# Patient Record
Sex: Male | Born: 1950 | Race: White | Hispanic: No | Smoking: Never smoker
Health system: Southern US, Community
[De-identification: ages and names within clinical notes are randomized; demographics above are authoritative.]

## PROBLEM LIST (undated history)

## (undated) DIAGNOSIS — M4810 Ankylosing hyperostosis [Forestier], site unspecified: Secondary | ICD-10-CM

## (undated) DIAGNOSIS — G8929 Other chronic pain: Secondary | ICD-10-CM

## (undated) DIAGNOSIS — I639 Cerebral infarction, unspecified: Secondary | ICD-10-CM

## (undated) DIAGNOSIS — IMO0001 Reserved for inherently not codable concepts without codable children: Secondary | ICD-10-CM

## (undated) DIAGNOSIS — I10 Essential (primary) hypertension: Secondary | ICD-10-CM

## (undated) DIAGNOSIS — H9190 Unspecified hearing loss, unspecified ear: Secondary | ICD-10-CM

## (undated) DIAGNOSIS — M1990 Unspecified osteoarthritis, unspecified site: Secondary | ICD-10-CM

## (undated) DIAGNOSIS — K219 Gastro-esophageal reflux disease without esophagitis: Secondary | ICD-10-CM

## (undated) DIAGNOSIS — R269 Unspecified abnormalities of gait and mobility: Secondary | ICD-10-CM

## (undated) HISTORY — PX: BACK SURGERY: SHX140

## (undated) HISTORY — PX: LAMINECTOMY: SHX219

## (undated) HISTORY — PX: TUMOR EXCISION: SHX421

---
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
## 2000-04-03 ENCOUNTER — Encounter: Admission: RE | Admit: 2000-04-03 | Discharge: 2000-04-03 | Payer: Self-pay | Admitting: Orthopedic Surgery

## 2000-04-03 ENCOUNTER — Encounter: Payer: Self-pay | Admitting: Orthopedic Surgery

## 2000-05-07 HISTORY — PX: KNEE ARTHROSCOPY W/ ACL RECONSTRUCTION: SHX1858

## 2000-10-07 HISTORY — PX: KNEE SURGERY: SHX244

## 2001-01-21 ENCOUNTER — Encounter: Payer: Self-pay | Admitting: Neurosurgery

## 2001-01-21 ENCOUNTER — Ambulatory Visit (HOSPITAL_COMMUNITY): Admission: RE | Admit: 2001-01-21 | Discharge: 2001-01-21 | Payer: Self-pay | Admitting: Neurosurgery

## 2001-02-08 ENCOUNTER — Encounter: Payer: Self-pay | Admitting: Neurosurgery

## 2001-02-10 ENCOUNTER — Inpatient Hospital Stay (HOSPITAL_COMMUNITY): Admission: RE | Admit: 2001-02-10 | Discharge: 2001-02-11 | Payer: Self-pay | Admitting: Neurosurgery

## 2001-02-10 ENCOUNTER — Encounter: Payer: Self-pay | Admitting: Neurosurgery

## 2001-03-28 ENCOUNTER — Encounter: Admission: RE | Admit: 2001-03-28 | Discharge: 2001-03-28 | Payer: Self-pay | Admitting: Neurosurgery

## 2001-03-28 ENCOUNTER — Encounter: Payer: Self-pay | Admitting: Neurosurgery

## 2001-12-20 ENCOUNTER — Encounter: Admission: RE | Admit: 2001-12-20 | Discharge: 2002-03-20 | Payer: Self-pay

## 2002-03-28 ENCOUNTER — Encounter: Admission: RE | Admit: 2002-03-28 | Discharge: 2002-06-26 | Payer: Self-pay

## 2002-06-27 ENCOUNTER — Encounter: Admission: RE | Admit: 2002-06-27 | Discharge: 2002-09-05 | Payer: Self-pay

## 2002-09-12 ENCOUNTER — Ambulatory Visit (HOSPITAL_COMMUNITY): Admission: RE | Admit: 2002-09-12 | Discharge: 2002-09-12 | Payer: Self-pay | Admitting: Family Medicine

## 2002-09-12 ENCOUNTER — Encounter: Payer: Self-pay | Admitting: Family Medicine

## 2002-09-22 ENCOUNTER — Encounter: Admission: RE | Admit: 2002-09-22 | Discharge: 2002-12-21 | Payer: Self-pay

## 2002-12-22 ENCOUNTER — Encounter: Admission: RE | Admit: 2002-12-22 | Discharge: 2003-03-22 | Payer: Self-pay

## 2003-09-23 ENCOUNTER — Encounter: Payer: Self-pay | Admitting: Emergency Medicine

## 2003-09-23 ENCOUNTER — Emergency Department (HOSPITAL_COMMUNITY): Admission: EM | Admit: 2003-09-23 | Discharge: 2003-09-23 | Payer: Self-pay | Admitting: Emergency Medicine

## 2003-10-09 ENCOUNTER — Ambulatory Visit (HOSPITAL_COMMUNITY): Admission: RE | Admit: 2003-10-09 | Discharge: 2003-10-09 | Payer: Self-pay | Admitting: Internal Medicine

## 2004-10-13 ENCOUNTER — Ambulatory Visit (HOSPITAL_COMMUNITY): Admission: RE | Admit: 2004-10-13 | Discharge: 2004-10-13 | Payer: Self-pay | Admitting: Family Medicine

## 2006-03-16 ENCOUNTER — Emergency Department (HOSPITAL_COMMUNITY): Admission: EM | Admit: 2006-03-16 | Discharge: 2006-03-16 | Payer: Self-pay | Admitting: Emergency Medicine

## 2007-07-11 ENCOUNTER — Inpatient Hospital Stay (HOSPITAL_COMMUNITY): Admission: EM | Admit: 2007-07-11 | Discharge: 2007-07-12 | Payer: Self-pay | Admitting: Emergency Medicine

## 2007-07-22 ENCOUNTER — Ambulatory Visit (HOSPITAL_COMMUNITY): Admission: RE | Admit: 2007-07-22 | Discharge: 2007-07-22 | Payer: Self-pay | Admitting: Family Medicine

## 2008-03-23 ENCOUNTER — Ambulatory Visit (HOSPITAL_COMMUNITY): Admission: RE | Admit: 2008-03-23 | Discharge: 2008-03-23 | Payer: Self-pay | Admitting: Family Medicine

## 2011-04-21 NOTE — Discharge Summary (Signed)
NAMERORAN, WEGNER                 ACCOUNT NO.:  [id number]   MEDICAL RECORD NO.:  [id number]          PATIENT TYPE:  INP   LOCATION:  A310                          FACILITY:  [facility]   PHYSICIAN:  Mila Homer. Sudie Bailey, M.D.DATE OF BIRTH:  Feb 10, 1951   DATE OF ADMISSION:  07/10/2007  DATE OF DISCHARGE:  08/05/2008LH                               DISCHARGE SUMMARY   This 60 year old male is admitted to the hospital with pneumonia.  He  had a benign two day hospitalization extending from July 11, 2007, to  July 12, 2007.  Vital signs remained stable.   His admission white cell count was 16,000 with 90% neutrophils, 6 lymphs  and his BMET showed a glucose of 144, BUN 5, creatinine 0.73.  ABGs on  room air showed pH 7.4, pCO2 45, pO2 67, bicarbonate 0.7.  His  urinalysis essentially negative except for 0-2 RBCs per HPF and culture  at one day was negative.  Blood cultures x2 one day were negative.  Recheck white cell count his second day was 8000, with 49% neutrophils,  39 lymphs, 8 monos and his BMET showed a sodium 146, bicarbonate 35 with  a glucose down to 91.   His chest x-ray showed low volume film with basilar atelectasis.   He was admitted to the hospital.  He was put on IV normal saline KVO  with standing orders, sputum culture and Neurontin 600 mg q.h.s.,  oxycodone/acetaminophen 5 mg q.4h. p.r.n., Protonix 40 mg daily, Avelox  500 mg IV daily.   He was put on Lovenox prophylaxis while in the hospital.  By August the  4th, this was really the first day in the hospital but second after  being started on IV antibiotics, he felt a good deal improved and by the  following day he is almost back to normal though still a little bit  weak.  It is felt that he had reached maximum hospital benefit and was  ready for discharge home.  At that time his IV was stopped and he was  discharged.  Before I left I discussed his pneumonia and his DISH at  length with the patient and wife.  His  rheumatologist, Dr. Corliss Skains, in  [place], had offered to have him go to [facility] for further review  and workup of his DISH, the patient had initially refused and he was  encouraged at the time of discharge to do this, both for his own benefit  and possibly for benefit of rheumatologist at [facility].   FINAL DISCHARGE DIAGNOSES:  1. Pneumonia.  2. Diffuse idiopathic skeletal hyperkeratosis.  3. Chronic pain.  4. Depression.   He is discharged home on:  1. Levaquin 500 mg daily for 15 days.  2. Prevacid 30 mg daily.  3. Neurontin 300 mg 2 q.h.s.  4. Cymbalta 60 mg q.a.m.  5. OxyContin 60 mg b.i.d. with OxyContin 30 mg b.i.d.  6. Oxycodone/acetaminophen 5/325, two tablets q.i.d. for breakthrough      pain.  7. Temazepam 30 mg two q.h.s.  8. Alprazolam one and one-half tablet q.h.s.  9.  Claritin OTC 10 mg p.r.n. for allergy.   Followup in the office in a week.  Again, discussed possibility of  further review of his DISH at a tertiary care center.      Mila Homer. Sudie Bailey, M.D.  Electronically Signed     SDK/MEDQ  D:  07/12/2007  T:  07/12/2007  Job:  [id number]

## 2011-04-21 NOTE — H&P (Signed)
[name], [name]                 ACCOUNT NO.:  [id number]   MEDICAL RECORD NO.:  [id number]          PATIENT TYPE:  INP   LOCATION:  A310                          FACILITY:  [facility]   PHYSICIAN:  Mila Homer. Sudie Bailey, M.D.DATE OF BIRTH:  [birth year]   DATE OF ADMISSION:  07/10/2007  DATE OF DISCHARGE:  LH                              HISTORY & PHYSICAL   This 60 year old presented to the hospital emergency room last night.  About 10:30, had shaking chill and felt horrible all over.   He said he really had not been feeling well over the last couple days  and actually had been coughing up some thick, greenish sputum at a  regular basis.  He felt so bad, he is actually taking the Levaquin 500  mg, last night before coming to the emergency room.   He has had some fever and chills with this.  He said he really did not  have any chest pain, no abdominal problems.  His chronic medical  problems include DISH, depression, obesity, insomnia.  His arthritic  problems are so severe, he is bedridden most of the time.  He is on  chronic narcotics, which was reduced last year, but had to be increased  again this year due to chronic pain.   CURRENT MEDICATIONS:  1. Neurontin 300 mg, 2 q.h.s.  2. Prevacid 30 mg daily.  3. MS Contin 60 mg b.i.d. and MS Contin 30 mg b.i.d.  4. Oxycodone 5 mg two q.i.d. for breakthrough pain.  5. Alprazolam 1 mg, 1-1/2 half tablets q.h.s.  6. Cymbalta 60 mg daily.  7. Temazepam 30 mg q.h.s.   ADMISSION EXAM:  GENERAL:  Showed a pleasant, obese middle-aged man.  VITAL SIGNS:  At time of my exam, his temperature was 97.3, pulse 83,  respiratory rate 22, blood pressure 98/61.  He was an excellent  historian.  He is no acute distress at the time I examined him, some  hours after he came into the emergency room.  He gave a good history.  Sentence structure was intact.  Sensorium appeared to be normal.  Color  was good.  HEENT:  Mucous membranes are moist.  There was no  axillary or  supraclavicular adenopathy.  LUNGS:  Clear throughout.  He is moving air well with no intercostal  retraction or use of accessory muscles of respiration.  Morbid obesity  was noted.  HEART:  Regular rhythm without murmur, rate of about 80.  ABDOMEN:  Soft, a little bit distended without hepatosplenomegaly or  mass or tenderness.  There is no edema of the ankles.   His admission white cell count was 16,000 which 90% neutrophils, 6  lymphs.  __________  showed a glucose of 144, BUN 5, creatinine 0.73.  Urine showed an SG 1015, pH 8.5, and 0 to 2 RBCs per HPF.  A Urine C&S  was pending, and a blood C&S was negative at less than 24 hours.  His  admission blood gas showed a pH 7.4, pCO2 of 45, pO2 of 67, bicarb of 27  on room air.  On  2 liters, his O2 sats 97%.   CHEST X-RAY:  Showed bibasilar atelectasis.   ADMISSION DIAGNOSIS:  1. Pneumonia.  2. DISH.  3. Depression.  4. Obesity.  5. Insomnia.   The patient has already been started on Avelox 400 mg IV daily, and he  actually feels a good deal better this morning, so I assume that either  this or the Levaquin he took p.o. last night before coming to the  emergency room kicked in and is helping him.  Cultures again are  pending.  A repeat the white cell count is due in the morning, and that  dropped down to normal, and he is afebrile and otherwise feeling better.  Will probably discharge him at that time.      Mila Homer. Sudie Bailey, M.D.  Electronically Signed     SDK/MEDQ  D:  07/11/2007  T:  07/11/2007  Job:  [id number]

## 2011-04-24 NOTE — H&P (Signed)
[name], [name]                 ACCOUNT NO.:  [id number]   MEDICAL RECORD NO.:  [id number]          PATIENT TYPE:  EMS   LOCATION:  ED                            FACILITY:  [facility]   PHYSICIAN:  Kingsley Callander. Ouida Sills, MD       DATE OF BIRTH:  03/26/1951   DATE OF ADMISSION:  03/16/2006  DATE OF DISCHARGE:  LH                                HISTORY & PHYSICAL   CHIEF COMPLAINT:  Fever.   HISTORY OF PRESENT ILLNESS:  This patient is a 60 year old white male who  presented to the emergency room after developing fevers, chills, and rigors  at home this morning.  He had been in his usual state of health yesterday.  He developed a shaking chill at home.  He presented to the emergency room  and was found to have a temperature of 102.3.  He has a chronic cough  productive of brown sputum but was found to have no infiltrate on chest x-  ray.  He denied headache, chest pain, abdominal pain, difficulty voiding,  dysuria, vomiting, or diarrhea.  He denied tick bite or rash.  He had  recently experienced a bout of shingles involving his left forehead.   PAST MEDICAL HISTORY:  1.  DISH.  2.  Depression.  3.  GERD.  4.  Allergies.  5.  Low back surgery in 2001.  6.  Two right knee surgeries.  7.  Hypertension.   MEDICATIONS:  1.  MS Contin 60 mg b.i.d.  2.  Oxycodone 5 mg 2 b.i.d.  3.  Neurontin 300 mg 2 q.h.s.  4.  Zyrtec 10 mg daily.  5.  Prilosec daily.  6.  Lexapro 10 mg daily.  7.  Alprazolam 1 mg q.h.s.  8.  Temazepam 30 mg q.h.s.   ALLERGIES:  CECLOR, AUGMENTIN.   SOCIAL HISTORY:  He denies alcohol or tobacco use.  He is disabled.  He is a  retired [employer] account executive.   REVIEW OF SYSTEMS:  As above.   PHYSICAL EXAMINATION:  VITAL SIGNS:  Temperature 102.3 initially, repeat  100.8, blood pressure 112/60, pulse initially 118, repeat 92, respirations  20, oxygen saturation 99%.  GENERAL:  Alert and oriented.  No acute distress.  HEENT:  No scleral icterus.  Pupils are equal,  round and reactive to light.  Pharynx moist, unremarkable.  NECK:  No lymphadenopathy.  LUNGS:  Clear.  HEART:  Regular with no murmurs.  ABDOMEN:  Nontender.  No hepatosplenomegaly.  GU:  No CVA tenderness.  EXTREMITIES:  No clubbing, cyanosis or edema.  He is generally somewhat  stiff.  NEUROLOGIC:  Grossly intact.   LABORATORY DATA:  White count 16,000, hemoglobin 15.1, platelets 206,000, 94  segs, 4 lymphs.  Sodium 135, potassium 3.6, bicarb 28, glucose 124, BUN 7,  creatinine 0.8, calcium 9.2.  Urinalysis reveals 3-6 white cells and 7-10  red cells.   Chest x-ray reveals no acute infiltrate.   IMPRESSION:  Probable urinary tract infection:  He has white cells and red  cells on his urinalysis but is asymptomatic.  There is no evidence of  another source of his fever at this point.  Hospitalization and treatment  with IV antibiotics was advised.  The patient declines and wants to be  treated as an outpatient.  He was made aware of his possible risk of  developing septic shock leading to death.  He and his wife were counseled in  this regard, and it is his decision to be discharged from the emergency  room.  He will therefore have two sets of blood cultures and a urine  culture.  Levaquin 500 mg IV will be administered now, and he will be  started on Levaquin 500 mg orally for 10 days.  He was advised to follow up  with Dr. Sudie Bailey tomorrow.  He was encouraged to use two Tylenol every 4  hours as needed for fever and to supplement with two Advil in between his  Tylenol doses if needed.      Kingsley Callander. Ouida Sills, MD  Electronically Signed     ROF/MEDQ  D:  03/16/2006  T:  03/16/2006  Job:  [id number]

## 2011-04-24 NOTE — Consult Note (Signed)
[facility]  Patient:    Cameron, [name] Visit Number: [id number] MRN: [id number]          Service Type: PMG Location: TPC Attending Physician:  Sondra Come Dictated by:   Sondra Come, D.O. Proc. Date: 01/02/02 Admit Date:  12/20/2001   CC:         [name], M.D.   Consultation Report  HISTORY OF PRESENT ILLNESS:  [name] returns to the clinic today as scheduled for re-evaluation.  He is status post left lumbar facet blocks on December 23, 2001, which he states gave him greater than 50% relief for a few hours after the injection.  He notes that later on that evening his pain started to gradually worsen, although when he woke up the following morning he felt significant relief of his pain until early that evening.  The patient noted that since he had significant relief of his left lower back pain, he believes there is a masked pain on his right side.  Currently his pain level is a 4/10 on a subjective scale.  His function and quality of life indices remain about the same, but improved during the anesthetic phase of the facet blocks.  His sleep is fair.  He continues to take Percocet as needed.  I reviewed the health and history form and 14-point review of systems.  PHYSICAL EXAMINATION:  A healthy male in no acute distress.  VITAL SIGNS:  The blood pressure is 140/78, pulse 78, respirations 20, O2 and saturation 97% on room air.  NEUROLOGIC:  No neurologic findings in the lower extremities, including motor sensory and reflexes.  IMPRESSION:  Chronic low back pain with considerable component from facet arthropathy given positive response to facet blocks.  The patient is status post lumbar laminectomy.  PLAN: 1. I had a thorough discussion with [name] regarding treatment options at    this point.  I think it is reasonable to proceed with radiofrequency    neurotomy of the left medial branches and L5 dorsal ramus.  I discussed  the    procedure with [name] in detail to include risks, benefits, limitations,    and alternatives.  The patient wishes to proceed.  We will schedule him for    later this week or next week for radiofrequency procedure. 2. Will consider right lumbar facet blocks at a later time as predicated upon    the patients response to medial branch neurotomy. 3. Continue current medications. 4. The patient is to return to clinic for procedure.  The patient was educated on the above findings and recommendations and understands.  There were no barriers to communication. Dictated by:   Sondra Come, D.O. Attending Physician:  Sondra Come DD:  01/02/02 TD:  01/03/02 Job: [id number]

## 2011-04-24 NOTE — Consult Note (Signed)
[facility]  Patient:    [name], RECORD Visit Number: [id number] MRN: [id number]          Service Type: PMG Location: TPC Attending Physician:  Sondra Come Dictated by:   Sondra Come, D.O. Proc. Date: 12/23/01 Admit Date:  12/20/2001   CC:         Cristi Loron, M.D.   Consultation Report  HISTORY OF PRESENT ILLNESS:  Mr. Standre returns to the clinic today as scheduled for a trial of left lumbar facet blocks for low back pain diagnostically and therapeutically.  Mr. Bless was initially seen on December 21, 2001.  He denies any significant changes in his low back pain, although he states that his pain is a little bit increased today secondary to not taking his Percocet.  I reviewed the health and history form and 14-point review of systems.  No new neurologic complaints.  PHYSICAL EXAMINATION:  A healthy male in no acute distress.  VITAL SIGNS:  The blood pressure is 134/86, pulse 73, respirations 18, and O2 saturation 98% on room air.  NEUROLOGIC:  No new neurologic findings in the lower extremities, including motor, sensory, and reflexes.  There is tenderness to palpation in bilateral lumbar paraspinals, greater on the left.  IMPRESSION:  Chronic low back pain with radiation of pain into the left hip, status post lumbar laminectomy.  Probable component of facet arthropathy contributing to the patients current symptoms.  PLAN: 1. Left lumbar facet block/medial branch blocks.  The procedure was described    to the patient in detail, including the risks, benefits, limitations, and    alternatives.  The patient wished to proceed.  We will forego using    steroids for this procedure secondary to the patients previous adverse    reaction to steroids.  Instead, I will use 0.5% bupivacaine only.  The    patient was brought back to the fluoroscopy suite and placed on the table    in the prone position.  The skin was prepped and draped in usual  sterile    fashion.  The skin and subcutaneous tissues were anesthetized with 2 cc of    1% lidocaine at four independent needle access points.  Under direct    fluoroscopic guidance in an oblique view, a 25 gauge 3-1/2 inch needle was    advanced to the junction of the left sacral ala and S1 superior articular    process.  Once bone was contacted, negative aspiration was performed.  This    was then injected with 0.75 cc of 0.5% bupivacaine.  Using independent    needle access points, the 25 gauge 3-1/2 inch spinal needle was advanced to    the junctions of the L3, L4, and L5 superior articular processes with their    corresponding transverse processes.  Once bone was contacted and negative    aspiration performed, each site was injected with 0.75 cc of    0.5% bupivacaine.  The patient tolerated the procedure well.  No    complications.  Discharge instructions given.  The patients preinjection    pain level was 8/10 and post injection 0/10. 2. Continue Percocet for now. 3. The patient is to return to the clinic in one week for re-evaluation.  The    patient was instructed to keep a log of his pain score over the next    several hours and days and report this at next visit.  A positive response  to anesthetic for these blocks may be followed by radiofrequency ablation    of medial branches.  We discussed this.  The patient was educated on the above findings and recommendations and understands.  There were no barriers to communication. Dictated by:   Sondra Come, D.O. Attending Physician:  Sondra Come DD:  12/24/01 TD:  12/26/01 Job: [id number]

## 2011-04-24 NOTE — Consult Note (Signed)
[facility]  Patient:    [name], [name] Visit Number: [id number] MRN: [id number]          Service Type: Attending:  Sondra Come, D.O. Dictated by:   Sondra Come, D.O. Proc. Date: 03/08/02   CC:         Cristi Loron, M.D.   Consultation Report  Mr. Render returns to clinic today as scheduled for reevaluation.  He was last seen on February 15, 2002.  In the interim Mr. Squier was started on Duragesic 25 mcg patches as we had discussed at last visit.  He states that the patches seem to be helping but he still continues to take Percocet q.i.d. for breakthrough pain.  His function and quality of life indexes have improved. His sleep is fair to good.  He continues walking four miles per day which he states does not aggravate his symptoms.  He also goes to the gym every other day for light upper body strengthening exercises.  He states that he is not overdoing it.  He continues to take Neurontin 300 mg q.i.d. which he states helps his radicular pain.  He continues to have pain in his low back, mainly. He recently got disability from his work and states that he is officially retired.  I review health and history form and 14 point review of systems.  We discussed medications at length.  PHYSICAL EXAMINATION  GENERAL:  Healthy male in no acute distress.  VITAL SIGNS:  Blood pressure 106/53, pulse 90, respirations 16, O2 saturation 99% on room air.  NEUROLOGIC:  Manual muscle testing is 5/5 bilateral lower extremities. Resisted left hip flexion does cause some increased left low back pain. Sensory examination is intact to light touch bilateral lower extremities. Muscle stretch reflexes are 2+/4 bilateral patellar, medial hamstrings, and Achilles.  IMPRESSION: 1. Chronic low back pain, multifactorial, improved with medications. 2. Lumbar facet syndrome nine weeks status post radiofrequency neural ablation    of the left medial branches without  considerable relief. 3. Degenerative disk disease of the lumbar spine status post multilevel lumbar    laminectomy with possible postlaminectomy syndrome contributing to his    current problems.  PLAN: 1. Mr. Vancuren and I had a long discussion regarding medications.  I would have    hoped that his Percocet use would have decreased with the initiation of    Duragesic, however, to the contrary.  He continues on the same dose of    Percocet.  We discussed this at length.  I think it is reasonable to    increase his Duragesic to 50 mcg q.72h. with the goal of decreasing his    breakthrough pain medication requirement.  New prescription for Duragesic    50 mcg one q.72h. #10 without refills.  I will also renew Percocet 10 mg    one p.o. t.i.d. p.r.n. for breakthrough pain #90 with no refills. 2. Continue Neurontin 300 mg q.i.d. 3. Continue alprazolam which has been written by Dr. Sudie Bailey, his primary    care Icelyn Navarrete for now.  Consider switching to Klonopin at a later time. 4. Patient to continue with home exercise program. 5. Patient to return to clinic in one month for reevaluation.  Patient was educated on the above findings and recommendations and understands.  There were no barriers to communication. Dictated by:   Sondra Come, D.O. Attending:  Sondra Come, D.O. DD:  03/08/02 TD:  03/09/02 Job: [id number]

## 2011-04-24 NOTE — Consult Note (Signed)
NAMEROBSON, [name]                           ACCOUNT NO.:  [id number]   MEDICAL RECORD NO.:  [id number]                   PATIENT TYPE:  REC   LOCATION:  TPC                                  FACILITY:  [facility]   PHYSICIAN:  Cameron George, DO                      DATE OF BIRTH:  05/14/1951   DATE OF CONSULTATION:  03/08/2003  DATE OF DISCHARGE:                                   CONSULTATION   HISTORY OF PRESENT ILLNESS:  The patient returns to clinic today for  reevaluation.  He was last seen on 02/05/2003.  He has been doing well in  terms of his pain management with medications including MS Contin 100 mg  q.12h. and MS Contin 15 to 30 mg q.12h. with oxycodone for breakthrough  pain.  The patient states that he does not notice any difference between  taking 130 mg of OxyContin b.i.d. and 115 mg of OxyContin b.i.d.  He would  like to go back to the 115 mg dose and also he would like to try to decrease  it just down to 100 mg b.i.d. at some point. I discussed this with him at  length and told him to use pain as his guide, but he has been very compliant  with his medications, and we discussed decreasing the medications slowly,  and he understands.  He did run out of oxycodone for breakthrough pain a few  days ago and has persistent diffuse pain/polyarthralgia's involving his  lower back, bilateral knees, bilateral shoulders, elbows, and hands.  His  function and quality of life indices remain somewhat declined but are  improved overall since I initially started seeing him.  In addition to the  opiate-based pain medication, he has continued to take Neurontin 300 mg  b.i.d.  He has discontinued Lexapro because he states that his depression  has resolved.  I reviewed the health and history form and 14-point Review of  Systems.   We had discussed referring him to Dr. Thyra [name] for further pain  management; however, for some reason he never got an appointment with Dr.  Vear [name], and he has  met with his primary care [name] in the interim who  will take over writing for his pain medications.  If symptoms seem to change  and medications need to be modified, I would recommend referral to Dr. Thyra [name].  He is also waiting for an appointment with Dr. Corliss [name],  rheumatologist, for evaluation.  I instructed him to call her office to find  out when his appointment is.   The patient's pain today scored at 5/10.   PHYSICAL EXAMINATION:  GENERAL:  Healthy male in no acute distress.  VITAL SIGNS:  Blood pressure 144/75, pulse 97, respirations 22, O2  saturation 99% on room air.  NEUROLOGIC:  The patient has slight difficulty getting up from a seated  position secondary to discomfort, and gait is slightly antalgic with single-  point cane.  No new neurologic findings in the upper and lower extremities  including motor, sensory, and reflexes today.  MUSCULOSKELETAL:  There is no synovitis noted in the hands; however, there  is some tenderness to palpation over the second metacarpophalangeal joint on  the left.  There is also discomfort with range of motion of the shoulders,  elbows, and hands.   IMPRESSION:  1. Post laminectomy syndrome.  2. Chronic low back pain.  3. Degenerative disk disease of lumbar spine.  4. Degenerative disk disease of thoracic spine.  5. Degenerative disk disease of the cervical spine.  6. Cervical spinal stenosis.  7. Polyarthralgia.    PLAN:  1. Continue MS Contin.  Gave prescription for MS Contin 100 mg 1 p.o. q.12h.     as well as MS Contin 15 mg 1 p.o. q.12h., #60 without refills each.     Again, the patient is interested in decreasing the MS Contin over time,     and I instructed him to decrease by 15 mg weekly until he is at 100 mg     b.i.d.  2. Continue oxycodone 5 mg 1 to 2 p.o. b.i.d. as needed for breakthrough     pain, #120 without refills.  3. Neurontin 300 mg 1 p.o. b.i.d., #60 with 2 refills.  4. Await rheumatology  appointment.  5. The patient is to follow up with his primary care [name].  He may     return to clinic on an as-needed basis.   The patient was educated about findings and recommendations and understands.  No barriers to communication.                                               Cameron George, DO    JW/MEDQ  D:  03/08/2003  T:  03/09/2003  Job:  [phone]

## 2011-04-24 NOTE — Consult Note (Signed)
[facility]  Patient:    [name], [name] Visit Number: [id number] MRN: [id number]          Service Type: PMG Location: [facility] Attending Physician:  Sondra Come Dictated by:   Sondra Come, D.O. Proc. Date: 12/21/01 Admit Date:  12/20/2001   CC:         Cristi [name], M.D.   Consultation Report  NEW PATIENT CONSULTATION  REFERRING Tayana Shankle:  Cristi [name], M.D.  Thank you very much for kindly referring Mr. Mickell [name] to [facility] for evaluation of low back pain.  The patient was seen in our clinic today.  Please refer to the following for details regarding the history and physical examination and treatment plan.  Once again, thank you for allowing [place] to participate in the care of [name].  CHIEF COMPLAINT:  Low back pain.  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  He was found to have lumbar spinal stenosis with lateral recess stenosis from L3-S1 with degenerative disk disease, spondylosis, and radiculopathy.  He underwent decompressive lumbar laminectomy at the L3-S1 levels by Danae Orleans. Venetia Maxon, M.D., on February 10, 2001. Postoperatively, he continued to have pain in his low back radiating into his left buttock and groin.  He complains of significant morning stiffness in his low back.  He underwent L1, L2, and L3 selective nerve root blocks on the left by Sheran Luz, M.D., in June or July of 2002.  He states that the nerve root blocks helped modestly for a very short period of time, although he had significant reaction to the steroids, including anxiousness and nervousness, as well as insomnia.  He has been treated with multiple anti-inflammatories, including Mobic, Lodine, Celebrex, Bextra, Voltaren, and Lortab without significant relief.   He feels that the Vioxx caused him to have rapid heart rate.  Currently he is taking Percocet 5 mg two to four per day as prescribed by Cristi [name], M.D.  His pain level is 7/10 on a subjective scale today and described as constant, sharp, and stabbing, involving his low back, radiating into his left hip.  His function and quality of life indices have declined overall, but remained stable.  His sleep is fair.  His symptoms are worse with bending and sitting and improved with rest and medications.  He has an exercise program at this point, which includes walking four miles every day to every other day and upper body weight exercises.  He has not had physical therapy.  He continues to work for the [employer] and we discussed the ramifications of being on narcotic medications in his current job position.  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  He denies any bowel and bladder dysfunction or lower extremity numbness or paresthesias.  I review health and history form and 14-point review of systems.  PAST MEDICAL HISTORY:  Hypertension.  PAST SURGICAL HISTORY: 1. Lumbar laminectomy. 2. Anterior cruciate ligament reconstruction of right knee. 3. Some type of benign mass removed from under his right shoulder blade.  FAMILY HISTORY:  Noncontributory.  SOCIAL HISTORY:  Denies smoking or alcohol use.  He is married and continues to work.  ALLERGIES:  STEROIDS.  VIOXX caused rapid heart rate.  MEDICATIONS: 1.  Prinivil. 2. Allegra. 3. Prilosec 4. Alprazolam 1 mg at bedtime as prescribed by his primary care Amberle Lyter. 5. Percocet 5 mg two to four per day.  PHYSICAL EXAMINATION:  A healthy male in no acute distress.  VITAL SIGNS:  The blood pressure is 154/90, pulse 80, respirations 18, and O2 saturation 96% on room air.  NEUROLOGIC:  Gait is normal.  Examination of the spine reveals a level  pelvic with decreased lumbar lordosis.  There is a large midline incisional scar.  No scoliosis noted.  Range of motion is some decreased in all planes with pain mainly on extension compared to flexion.  Flexion does, however, cause a pulling sensation in his low back.  Manual muscle testing is 5/5 in bilateral lower extremities.  The sensory examination is intact to light touch in bilateral lower extremities.  Muscle stretch reflexes are 2+/4 in bilateral patellar, medial hamstrings, and Achilles.  Palpatory examination reveals tenderness to palpation in bilateral lumbar paraspinal muscles.  Pulses are present bilaterally in the lower extremities distally.  Straight leg raise and Fabers tests are negative bilaterally.  LABORATORY DATA:  MRI of the lumbar spine dated November 17, 2001, reveals: At L5-S1 there is asymmetric facet arthropathy, left greater than right, with mild left S1 nerve root encroachment in the lateral recess with no definite L5 nerve root encroachment in the foramen.  At L4-5, there is marked posterior element hypertrophy with small central disk protrusion with no root cut off or spinal stenosis.  At L3-4, there is marked posterior element hypertrophy, broad-based disk central protrusion central and to the left, and no definite L4 nerve encroachment of the canal.  There may be mild left L3 nerve root encroachment due to disk material in the foramen.  At L2-3, there is mild facet hypertrophy and no stenosis or disk protrusion.  At L1-2, there is mild annular bulging, but no stenosis or disk protrusion.  IMPRESSION: 1. Chronic low back pain with left lower extremity radicular symptoms into the    left buttock, possibly multifactorial. 2. Degenerative disk disease of the lumbar spine without myelopathy. 3. Facet arthropathy of the lumbar spine.  PLAN: 1. I had a long and thorough discussion with [name] regarding his low back    pain and treatment options at this  point.  The patient is not a surgical     candidate at this time per Cristi [name], M.D.  Given the patients    symptoms and his MRI findings, it is conceivable that his pain may be    stemming from his facet arthropathy or least there is a contributing factor    from the facet arthropathy and I cannot rule this out based on physical    exam.  Therefore, it is reasonable to proceed with diagnostic/therapeutic    facet blocks on the left.  A positive response to facet blocks may lead    towards radiofrequency ablation of the lumbar medial branches on the left.    A negative response or no response to the injection would lead me to    believe that the patients symptoms are main secondary to discogenic    etiology and possibly secondary to the epidural fibrosis.  At that point, I    would consider epidural lysis of adhesions/Racz, which was discussed in the    past by Sheran Luz, M.D. 2. Continue Percocet for now. 3. Consider weaning from alprazolam and substituting a low-dose tricyclic    antidepressant for sleep. 4. The  patient is to return to clinic for left lumbar facet blocks.  The patient was educated on the above findings and recommendations and understands.  There were no barriers to communication. Dictated by:   Sondra Come, D.O. Attending Physician:  Sondra Come DD:  12/22/01 TD:  12/24/01 Job: [id number]

## 2011-04-24 NOTE — Consult Note (Signed)
[facility]  Patient:    [name], [name] Visit Number: [id number] MRN: [id number]          Service Type: PMG Location: [facility] Attending Physician:  Sondra Come Dictated by:   Sondra Come, D.O. Proc. Date: 05/19/02 Admit Date:  03/28/2002                            Consultation Report  HISTORY OF PRESENT ILLNESS:  The patient returns to clinic today sooner than scheduled for reevaluation.  He continues to have significant and worsening low-back pain over the last two months.  He continues on Duragesic 50 mcg q.48h. and Percocet 10 mg/650 mg t.i.d..  He has requested to increase his Percocet dose to q.i.d. because of the increase in his pain.  The patient is very concerned about his escalating requirement of narcotic pain medication, as he is somewhat reluctant to proceed in this fashion secondary to fear of becoming addicted to the medicine.  We discussed this at length.  We discussed the difference between addiction, physical dependence, and pseudoaddiction.  I do not believe the patient displays any drug-seeking behavior.  In fact, on the contrary he, if anything, would not like to take this medicine if possible.  He also states that he is starting to have some pain radiating down into his left lower extremity again.  He believes that the rainy weather may be somewhat responsible for his increase in pain.  He brings his medications with him and has 26 Percocet pills remaining and six Duragesic patches remaining as well.  He continues on ibuprofen 800 mg as needed.  I review the health and history form and 14-point review of systems.  Pain is a 7/10 on a subjective scale.  Function and quality of life indices remain declined today. Sleep is poor.  Other pain complaints include bilateral shoulder and knee pain, which he attributes to arthritis.  PHYSICAL EXAMINATION:  GENERAL:  A healthy male in no acute distress.  VITAL SIGNS:  Blood pressure  118/57, pulse 61, respirations 18.  O2 saturation is 94% on room air.  NEUROLOGIC:  The patient has full range of motion of the shoulders and knees but with discomfort.  The patient has some discomfort upon standing from a sitting position.  Gait is antalgic.  Palpatory examination of the lumbar spine reveals modest tenderness to palpation bilaterally.  Manual muscle testing is 5/5 bilateral lower extremities with some subtle giveaway weakness on the right.  Sensory examination is intact to light touch bilateral lower extremities.  Muscle stretch reflexes are symmetric bilateral lower extremities.  IMPRESSION: 1. Postlaminectomy syndrome. 2. Chronic low-back pain. 3. Degenerative disk disease of the lumbar spine, status post multilevel    lumbar laminectomy. 4. Lumbar facet syndrome.  PLAN: 1. I had a thorough discussion with the patient regarding treatment options.    This was an extensive consultation of 25 minutes duration.  I am sensitive    to the patients concern regarding narcotic escalation and we discussed at    length myths surrounding narcotic addiction.  I do not feel that escalating    his Duragesic dose to 75 mcg will cause him to be addicted to the    medication and we discussed this at length; however, we will hold off on    doing so at this time per patient request.  We will first start by    increasing his Neurontin dose  up to 1800 mg a day by adding one 300-mg pill    every two days until he is at 1800 mg.  This will take four days. 2. Continue oxycodone for breakthrough pain.  We will write him a prescription    for oxycodone 5 mg two p.o. q.i.d. as needed for breakthrough pain #50    without refills.  He may continue taking the Percocet in substitution for    the oxycodone but I am somewhat concerned about the Tylenol dosing with the    Percocet four times a day.  We discussed proper usage of these medications    and the patient understands. 3. Continue to use  the transcutaneous electrical nerve stimulation unit. 4. We discussed alternative therapies to include acupuncture.  Also,    discussed possible trial of spinal cord stimulator, which he is somewhat    reluctant to pursue at this point. 5. Discussed further injection therapies, although the patient has very    significant response to any type of steroid injections.  Therefore, I do    not feel that he is a candidate for further epidural injections. 6. Recommend aquatic therapy. 7. The patient to return to clinic in 10 days for reevaluation.  The patient was educated on the above findings and recommendations and understands.  There were no barriers to communication. Dictated by:   Sondra Come, D.O. Attending Physician:  Sondra Come DD:  05/19/02 TD:  05/22/02 Job: [id number]

## 2011-04-24 NOTE — Op Note (Signed)
NAME:  [name], CAPOZZOLI A                           ACCOUNT NO.:  [id number]   MEDICAL RECORD NO.:  [id number]                   PATIENT TYPE:  AMB   LOCATION:  DAY                                  FACILITY:  [facility]   PHYSICIAN:  Lionel December, M.D.                 DATE OF BIRTH:  October 14, 1951   DATE OF PROCEDURE:  DATE OF DISCHARGE:                                 OPERATIVE REPORT   PROCEDURE:  Total colonoscopy followed by esophagogastroduodenoscopy.   ENDOSCOPIST:  Lionel December, M.D.   INDICATIONS:  Markez is a 60 year old Caucasian male with severe back  problems who is on multiple meds.  He was recently noted to have heme  positive stools. He also has mild anemia.  He has chronic GERD.  He is on  PPI and symptom control was felt to be satisfactory.  He is undergoing  diagnostic evaluation.  If his colonoscopy is normal, he will undergo  esophagogastroduodenoscopy as well.  The procedures and risks were reviewed  with the patient and informed consent was obtained.   PREOPERATIVE MEDICATIONS:  Cetacaine spray for topical oropharyngeal  anesthesia.  Demerol 50 mg IV and Versed 12 mg IV.   FINDINGS:  Procedure performed in endoscopy suite.  The patient's vital  signs and O2 saturation were monitored during the procedure and remained  stable.   PROCEDURE #1: TOTAL COLONOSCOPY:  A rectal examination was performed.  No  abnormality noted on external or digital exam.   Olympus videoscope was placed in the rectum and advanced under vision into  the sigmoid colon and beyond.  Preparation was satisfactory.  There was  diffuse pigmentation of mucosa from the rectum all the way to the cecum.  It  was quite prominent in his ascending colon and cecum.  The cecal landmarks  were very easily identified. Pictures were taken of the appendiceal orifice  and ileocecal valve.  As the scope was withdrawn the colonic mucosa was  carefully examined.  There was a focal area of either mucosal edema or  small  polyp in the transverse colon.  This was ablated by a cold biopsy.  The  mucosa of the rest of the colon was normal.  The rectal mucosa similarly was  normal.   The scope was retroflexed to examine the anorectal junction and small  hemorrhoids were noted below the dentate line.  The endoscope was  straightened and withdrawn and the patient prepared for procedure #2.   PROCEDURE #2: ESOPHAGOGASTRODUODENOSCOPY:  The Olympus videoscope was passed  via the oropharynx without any difficulty into the esophagus.   ESOPHAGUS:  Mucosa of the esophagus was normal throughout.  Squamocolumnar  junction was unremarkable and no hernia was noted.   STOMACH:  It was empty and distended very well with insufflation.  The folds  of the proximal stomach were normal.  Examination of the mucosa revealed  swollen prepyloric/pyloric folds  with erosion on it.  The pyloric channel  was patent though.  The angularis, fundus, and cardia were examined by  retroflexing the scope and were normal.   DUODENUM:  Examination of the bulb and postbulbar duodenum was normal.   The endoscope was withdrawn.   The patient tolerated the procedures well.   FINAL DIAGNOSES:  1. Melanosis coli and small external hemorrhoids.  2. Small polyp ablated by a cold biopsy from the transverse colon.  3. Swollen and prepyloric/pyloric fold with single erosion, otherwise normal     esophagogastroduodenoscopy.   RECOMMENDATIONS:  He will resume his usual meds as well as Prilosec.  We  will check his H&H and H. pylori serology today.  I will be contacting the  patient with results of the blood test and biopsy over the next couple of  days.      ___________________________________________                                            Lionel December, M.D.   NR/MEDQ  D:  10/09/2003  T:  10/09/2003  Job:  [id number]   cc:   Mila Homer. Sudie Bailey, M.D.  [address] [place], [place] [address]  Fax: [phone]

## 2011-04-24 NOTE — Consult Note (Signed)
[name], [name]                           ACCOUNT NO.:  [id number]   MEDICAL RECORD NO.:  [id number]                   PATIENT TYPE:  REC   LOCATION:  [facility]                                  FACILITY:  [facility]   PHYSICIAN:  Sondra Come, D.O.                 DATE OF BIRTH:  08-Sep-1951   DATE OF CONSULTATION:  07/26/2002  DATE OF DISCHARGE:                  PHYSICAL MEDICINE & REHABILITATION CONSULTATION   HISTORY OF PRESENT ILLNESS:  The patient returns to clinic today as  scheduled for reevaluation.  He was last seen June 28, 2002.  Overall, he  seems to be very stable in terms of his low back pain on the Duragesic 50  mcg q.48 h. and Roxicodone 15 mg b.i.d. as needed for breakthrough pain.  Typically, this results in b.i.d. dosing consistently.  His pain today is a  4/10 on a subjective scale.  He has seen Dr. Venetia Maxon in the interim who has  ordered MRIs of his cervical, thoracic, and lumbar spine as the patient has  reported that he is now having pain radiating up into his thoracic and  cervical regions as well.  He has also seen Dr. Lequita Halt for his left  shoulder and elbow pain.  He states he has such severe arthritis in his left  shoulder that he may be a candidate for elbow replacement surgery although  that would limit him to five pounds of lifting for the rest of his life and  he is not interested in this.  Overall, his function and quality of life  indices have improved to some degree.  He continues a home exercise program  which includes walking and light weightlifting.  His sleep is good.  He has  had one additional episode of urinary incontinence at night and I am  uncertain of the etiology in this regard.   REVIEW OF SYSTEMS:  I reviewed the health and history form and 14-point  review of systems.   PHYSICAL EXAMINATION:  GENERAL:  A healthy-appearing male in no acute  distress.  VITAL SIGNS:  Blood pressure 124/59, pulse 75, respirations 18, O2  saturation is 97% on  room air.  EXTREMITIES:  Manual muscle testing is 5/5 bilateral lower extremities.  Sensory examination is intact to light touch bilateral lower extremities at  this time.  Muscle stretch reflexes are symmetric bilateral lower  extremities. Gait is slightly antalgic.  The patient does have some  difficulty going from sitting to standing secondary to low back pain.   IMPRESSION:  1. Post laminectomy syndrome.  2. Chronic low back pain.  3. Degenerative disk disease of the lumbar spine status post multilevel     lumbar laminectomy.  4. Lumbar facet arthropathy.  5. Urinary incontinence of uncertain etiology.   PLAN:  1. Continue Duragesic 50 mcg one q.48 h. dispense #15 without refills.  2. Continue Roxicodone 15 mg 1/2 to 1 p.o. b.i.d. as needed for breakthrough  pain dispense #60 without refills.  3. Continue Neurontin 600 mg one p.o. t.i.d. dispense #270 without refills.     This is a three-month supply.  4. Continue with his home exercise program.  5. Will try to obtain copies of notes from Dr. Venetia Maxon and Dr. Lequita Halt.  6. Continue glucosamine chondroitin.  7. Consider urology consultation for possible urodynamic studies.  8. The patient to return to clinic in one month for reevaluation.   The patient was educated on above findings and recommendations and  understands.  There were no barriers to communication.                                               Sondra Come, D.O.    JJW/MEDQ  D:  07/26/2002  T:  07/26/2002  Job:  [id number]   cc:   Danae Orleans. Venetia Maxon, M.D.  [address]  [place]  [place] [address]  Fax: [phone]

## 2011-04-24 NOTE — Consult Note (Signed)
NAMEJAYEN, [name]                           ACCOUNT NO.:  [id number]   MEDICAL RECORD NO.:  [id number]                   PATIENT TYPE:  REC   LOCATION:  [facility]                                  FACILITY:  [facility]   PHYSICIAN:  Zachary George, DO                      DATE OF BIRTH:  [birth year]   DATE OF CONSULTATION:  12/05/2002  DATE OF DISCHARGE:                                   CONSULTATION   REASON FOR CONSULTATION:  The patient returns to clinic today for  reevaluation.  He was last seen on November 06, 2002.  He has been doing very  well in terms of his pain on the Avinza 120 mg two p.o. every day.  He has  been breaking through intermittently and has been using oxycodone 5 mg up to  six times per day, depending on the severity of his pain.  He states that he  does have days where he does not need any breakthrough pain medication.  Currently, his pain is a 2/10 on a subjective scale.  He continues going to  gym on a daily basis but has not been walking as much primarily because of  the weather.  He is waiting for his rheumatology appointment.  His function  and quality-of-life indices have improved.  His sleep is better, although he  does wake up occasionally with pain in his shoulders or low back or lower  extremities.  In addition, the patient states that he restarted Neurontin  300 mg b.i.d. secondary to some jitteriness that he was experiencing after  he discontinued the medication and he states that it is helping his  radicular component as well as helping him with his jittery feeling.  He  also continues on mineral oil and stool softeners and is having regular  bowel movements.  I reviewed health and history form and 14-point review of  systems.   PHYSICAL EXAMINATION:  GENERAL:  Physical examination reveals a healthy male  in no acute distress.  VITAL SIGNS:  Blood pressure 132/62, pulse 86, respirations 18, O2  saturation 97% on room air.  NEUROMUSCULAR:  The patient still  has some slight difficulty in getting up  from sitting position to standing position secondary to pain, but this is  significantly less than previous.  He does have tightness in his thoracic  paraspinous muscles bilaterally.  No new neurologic findings on physical  exam including motor, sensory and reflexes in the upper and lower  extremities.   IMPRESSION:  1. Post-laminectomy syndrome.  2. Chronic low back pain.  3. Degenerative disk disease of the spine including lumbar, cervical and     thoracic.  4. Lumbar facet arthropathy.  5. Cervical spinal stenosis.  6. Severe osteoarthritis, multiple joints.   PLAN:  1. Continue Avinza 120 mg two p.o. every day, #60 without refills.  The  patient brought his pills for a pill count and it is appropriate.  2. Continue oxycodone 5 mg one to two p.o. b.i.d. as needed for breakthrough     pain, #100 without refills.  The patient brings his oxycodone for a pill     count and is appropriate.  3. Continue a home exercise program.  4. Continue mineral oil and stool softener.  5. Patient to return to clinic in one month for reevaluation.   Patient was educated in the above findings and recommendations and  understands.  There were no barriers to communication.                                               Zachary George, DO    JW/MEDQ  D:  12/05/2002  T:  12/06/2002  Job:  [id number]

## 2011-04-24 NOTE — Consult Note (Signed)
[facility]  Patient:    [name], [name] Visit Number: [id number] MRN: [id number]          Service Type: PMG Location: [facility] Attending Physician:  Sondra Come Dictated by:   Sondra Come, D.O. Proc. Date: 04/28/02 Admit Date:  03/28/2002                            Consultation Report  HISTORY OF PRESENT ILLNESS:  Mr. Hlavaty returns to clinic today as scheduled for reevaluation.  He was last seen on March 30, 2002.  He continues to have good and bad days.  Today seems to be a slightly worse day than he has been having recently.  His pain today is a 6/10 on a subjective scale.  Overall, he continues to see improvement in his symptoms.  He continues to have low back pain radiating into his groin.  He also has arthritic pain in his knees and elbows.  His function and quality of life indices are improved. He continues walking in a home exercise program.  He does relate a history of six episodes of urinary incontinence over the past month.  His last MRI was in December 2002.  I reviewed the health and history form and 14 point review of systems.  The patients pain is constant, sharp, stabbing, with associated weakness in the lower extremities on occasion.  He continues on Duragesic 50 mcg q.48h. which he stated is helping significantly.  He also has been taking Percocet 10 mg 2-3 q.d. for breakthrough pain.  Mr. Cocuzza and I discussed his medications at length.  PHYSICAL EXAMINATION:  GENERAL:  A healthy male in no acute distress.  VITAL SIGNS:  Blood pressure 127/63, pulse 98, respirations 12, O2 saturation 98% on room air.  MANUAL MUSCLE TESTING:  5/5 bilateral lower extremities.  SENSORY:  Intact to light touch bilateral lower extremities.  MUSCLE STRETCH REFLEXES:  Symmetric bilateral lower extremities.  ELBOWS:  No heat, erythema, or edema.  There is no tenderness to palpation over the lateral or medial epicondyles.  The patient does not have  full extension bilaterally.  IMPRESSION: 1. Postlaminectomy syndrome. 2. Chronic low back pain, multifactorial, overall improved with occasional    exacerbation. 3. Lumbar facet syndrome. 4. Degenerative disk disease of the lumbar spine, status post multilevel    lumbar laminectomy.  PLAN: 1. Continue Duragesic 50 mcg patch q.48h.  Mr. Eyerman and I discussed possibly    increasing this to 37, but he wishes to keep it at 50 mg, #15 without    refills. 2. Continue Percocet 10 mg 1 p.o. b.i.d. to t.i.d. p.r.n. breakthrough pain,    #90 without refills. 3. Continue Neurontin. 4. Continue home exercise program. 5. Will consider repeat MRI and possible urologic referral if the patients    urinary incontinence worsens or persists.   The patient and I discussed    this at length.  We will continue to monitor this. 6. The patient to return to clinic in one month for reevaluation.  The patient was educated on the above-findings and recommendations and understands.  No barriers to communication. Dictated by:   Sondra Come, D.O. Attending Physician:  Sondra Come DD:  04/28/02 TD:  05/02/02 Job: [id number]

## 2011-04-24 NOTE — Consult Note (Signed)
[name], [name]                           ACCOUNT NO.:  [id number]   MEDICAL RECORD NO.:  [id number]                   PATIENT TYPE:  REC   LOCATION:  [facility]                                  FACILITY:  [facility]   PHYSICIAN:  Zachary George, DO                      DATE OF BIRTH:  06-08-1951   DATE OF CONSULTATION:  DATE OF DISCHARGE:                                   CONSULTATION   [name] returns to clinic today sooner than scheduled for re-evaluation.  [name] states that over the past three weeks, he has had a significant  amount of breakthrough pain involving his low back, which is now radiating  to his posterior thighs bilaterally on occasion.  He also complains of  severe left shoulder pain and left hand pain.  He has diffuse osteoarthritis  and post laminectomy syndrome, which have been well-controlled with Avinza  120 mg two p.o. daily over the past month or so, until the past three weeks  when he had significant breakthrough pain.  He thinks the change in the  weather has contributed to his current pain complaints.  He has a  rheumatology appointment next week.  He continues his exercise program.  He  has been taking Oxycodone 5 mg one to six per day as needed for breakthrough  pain in addition to his Avinza, and has 18 Oxycodone pills remaining.  He  has 32 Avinza pills remaining.  He is also concerned about the cost of  Avinza and asked if there is a generic version.  His pain today is a 4/10 on  subjective scale.  He also feels like he needs to follow back up with his  orthopedist in regards to his left shoulder pain.   I reviewed Health and History form and Review of Systems.   PHYSICAL EXAMINATION:  A healthy appearing male in some distress, especially  with going from a seated position to a standing position.  He also has  significant discomfort with range-of-motion of his upper extremities.  Blood  pressure 129/72.  Pulse 87.  Respirations 20.  Oxygen saturation 95%  on room  air.  NEUROLOGIC:  No new neurologic findings in the upper and lower extremities,  including motor, sensory, and reflexes, which are intact at this time.  Range-of-motion of his upper extremities again, is painful, especially on  the left with shoulder flexion and abduction greater than 90%.  Provocative  maneuvers reveal a positive Hawkins' maneuver with minimally positive near  and empty can tests.  O'Brien test is negative.   IMPRESSION:  1. Post laminectomy syndrome  2. Chronic low back pain.  3. Degenerative disk disease of the spine, including: Lumbar, cervical, and     thoracic.  4. Lumbar facet arthropathy.  5. Cervical spinal stenosis.  6. Severe osteoarthritis, multiple joints.   PLAN:  1. Continue  Avinza 120 mg two p.o. daily for now.  Will switch to MS Contin     100 mg one p.o. q.12h and MS Contin 15 mg one p.o. q.12h after his Avinza     runs out.  I have written prescriptions for both strengths of the MS     Contin to be filled after 01/05/2003 with 60 pills each.  2. Continue Oxycodone 5 mg one to two p.o. t.i.d. as needed for breakthrough     pain, #150, without refills.  3. Will continue to monitor pain symptoms.  Would consider referral to     evaluate for spinal cord stimulation for his post laminectomy syndrome,     lower back, and lower extremity pain if symptoms are not improving.  4. Await Rheumatology appointment.  5. Await follow up with Orthopedics in regards to left shoulder pain.     Patient is not a candidate for subacromial steroids secondary to his     severe reaction to corticosteroids.  6. Continue glucosamine chondroitin.  7. Continue Lexapro and Neurontin.  8. Patient to return to clinic in 6 weeks for re-evaluation.   Patient was educated in both findings and recommendations and understands.  There were no barriers to communication.                                                 Zachary George, DO    JW/MEDQ  D:  12/25/2002  T:   12/25/2002  Job:  [id number]

## 2011-04-24 NOTE — Consult Note (Signed)
[facility]  Patient:    [name], [name] Visit Number: [id number] MRN: [id number]          Service Type: PMG Location: [facility] Attending Physician:  Sondra Come Dictated by:   Sondra Come, D.O. Admit Date:  12/20/2001                            Consultation Report  HISTORY OF PRESENT ILLNESS:  The patient returns to the clinic sooner than scheduled for reevaluation. I last saw him on January 19, 2002. The patient continues to complain of severe pain in his low back which is intermittent in nature. At last visit, the patient was also having severe pain in his low back which seemed to improve over the next few days to the point where he was able to go to the gym to perform upper body weight lifting exercises as well as walking his typical 4-mile walks. A few days ago the patient states that his low back pain began to increase again. His pain today is a 6/10 on a subjective scale. The patient states that it feels like grinding and crunching in his back. His pain is worse with sitting and improved with standing and walking although standing upright is somewhat significantly painful. He also has increased pain with bearing down for a bowel movement. He has been taking Neurontin 300 mg 3 times daily which he states has relieved his left buttock and left lower extremity radicular pain, but he continues to have pain in his low back. He relates a history of intermittent episodes like this over the past few years. We are approximately 3 weeks status post radiofrequency ablation of the left lumbar medial branches, and I do not expect to see the full effect of this for several more weeks. I review health and history form and 14-point review of systems. I also review the previous MRI November 07, 2001.  GENERAL:  A healthy male in no acute distress although the patient has considerable pain on getting up from the chair to stand and getting onto the examination  table.  VITAL SIGNS:  Blood pressure 149/96, pulse 87, respirations 12, O2 saturation 97% on room air.  EXTREMITIES:  There are no new neurological findings in the lower extremities. Manual muscle testing is 5/5 bilateral lower extremities. Sensory examination is intact to light touch bilateral lower extremities. Muscle stretch reflexes are 2+/4 bilateral patellar, medial hamstrings, and Achilles. Straight leg raise is negative bilaterally.  IMPRESSION: 1. Chronic low back pain with exacerbation. Likely multifactorial.    The patients symptoms today sound discogenic in etiology,    although the patient has facet arthropathy as well. 2. Degenerative disk disease of the lumbar spine status post    lumbar laminectomy.  PLAN: 1. Again, I had a thorough discussion with the patient regarding    his low back pain and frequency of exacerbations. He is using    his Percocet medications appropriately. Typically he takes    two 5 mg pills twice a day but increases this reluctantly for    increased pain. I think it is reasonable to increase his dose    to 10 mg/325 mg 1 p.o. up to 4 times daily as needed. I have    given him a prescription for #60 without refills. He is a    reliable patient and has not shown any abuse of his medications. 2. Continue Neurontin 300 mg t.i.d.  3. Continue Bextra 20 mg daily. 4. I have instructed the patient to decrease weight lifting for the    next 2 weeks. It is okay for him to walk at a leisurely pace. 5. If symptoms do not improve over the next month, we will consider    repeating MRI of his lumbar spine to evaluate discogenic changes. 6. I anticipate the patients symptoms to improve over the next    several weeks. I have instructed the patient on static-to-dynamic    lumbar stabilization exercises to include pelvic tilt, quadruped,    and stretching. 7. The patient is to return to clinic in 1 month for reevaluation    or sooner as needed.  The patient  was educated on the above findings and recommendations and understands. There were no barriers to communication. Dictated by:   Sondra Come, D.O. Attending Physician:  Sondra Come DD:  01/30/02 TD:  01/31/02 Job: [id number]

## 2011-04-24 NOTE — Consult Note (Signed)
[name], [name]                           ACCOUNT NO.:  [id number]   MEDICAL RECORD NO.:  [id number]                   PATIENT TYPE:  REC   LOCATION:  [facility]                                  FACILITY:  [facility]   PHYSICIAN:  Zachary George, DO                      DATE OF BIRTH:  21-Apr-1951   DATE OF CONSULTATION:  02/05/2003  DATE OF DISCHARGE:                                   CONSULTATION   The patient returns to clinic today for reevaluation.  He was last seen on  12/25/2002.  He seems to be doing well in terms of his pain control with MS  Contin 100 mg q.12h, MS Contin 15 mg q.12h., and oxycodone for breakthrough  pain.  He typically averages 4 oxycodone 5 mg pills per day for breakthrough  pain.  Some days he requires 6; somedays he requires 0, however.  He is  taking it on a consistent basis.  His current pain level is 3/10 on a  subjective scale.  Function and quality of life indices remain stable.  His  sleep is fair to good.  I reviewed the health and history form and 14-point  Review of Systems.  The patient continues to have pain in his lower back,  neck, left shoulder, left hand, and bilateral knees.  He continues a home  exercise program and tries to be as active as possible.  He continues  ambulating with a single-point cane.   PHYSICAL EXAMINATION:  GENERAL: Healthy male in no acute distress.  VITAL SIGNS:  Blood pressure 135/81, pulse 76, respirations 20, O2  saturation 96% on room air.  NEUROLOGIC:  Gait is slightly antalgic.  Manual muscle testing if 5/5  bilateral upper and lower extremities.  Sensory examination is intact to  light touch bilateral upper and lower extremities at this time.  Muscle  stretch reflexes are symmetric bilateral upper and lower extremities.  No  heat, erythema, or edema in the upper and lower extremities.   IMPRESSION:  1. Post laminectomy syndrome.  2. Chronic low back pain.  3. Degenerative disk disease of the lumbar spine.  4.  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  5. Degenerative disk disease of the cervical spine.  6. Cervical spinal stenosis.  7. Polyarthralgia.   PLAN:  1. Discussed further treatment options with the patient.  We discussed his     current medication regimen.  It seems as if his oxycodone use is partly     for maintenance, and I discussed this with him.  It is reasonable to     increase his long-acting opiate pain medication.  Will increase MS Contin     to 130 mg q.12h.  I have written prescription for MS Contin 100 mg 1 p.o.     q.12h., #60 without refills.  I have also written prescription for Ms  Contin 30 mg 1 p.o. q.12h., #60 without refills.  2. Continue oxycodone 5 mg 1 to 2 p.o. t.i.d. as needed for breakthrough     pain only, and I discussed this with him.  We will monitor patient's     usage pattern.  The patient understands the potential habituating nature     of this medication.  3. Continue Neurontin.  4.     Continue Lexapro and alprazolam.  5. The patient is to return to clinic in one month for reevaluation.   The patient was educated about findings and recommendations and understands.  No barriers to communication.                                               Zachary George, DO    JW/MEDQ  D:  02/05/2003  T:  02/05/2003  Job:  [id number]

## 2011-04-24 NOTE — Consult Note (Signed)
NAMECouper, [name] NO.:  [id number]   MEDICAL RECORD NO.:  [id number]                    PATIENT TYPE:   LOCATION:                                       FACILITY:   PHYSICIAN:  Sondra Come, D.O.                 DATE OF BIRTH:  26-Nov-1951   DATE OF CONSULTATION:  10/25/2002  DATE OF DISCHARGE:                                   CONSULTATION   HISTORY OF PRESENT ILLNESS:  The patient returns to clinic today as  scheduled for reevaluation.  He was last seen on September 25, 2002.  The  patient complains of pain diffusely today involving not only his low back  but also his elbow, his hands and his knees.  I suspect he has significant  osteoarthritis contributing to his current symptoms and the rainy weather  today seems to have aggravated his pain.  He tells me, however, that he does  have good days, although he rarely returns to clinic having a good day and  we discussed further management.  In addition, the patient really feels like  he would like to start backing off of some of his chronic opiate therapy for  two reasons; (1) he truly would like to get off of narcotic based pain  medication, although it has been quite helpful in decreasing his pain levels  and keeping him somewhat functional; (2) he has a hard time with the  Duragesic sticking to his skin and the occlusive dressing tears his skin off  and he would consider changing to a different medication.  In addition, he  would like to start decreasing his Neurontin secondary to a foggy cognitive  sensation and poor concentration which he attributes to the Neurontin.  He  has actually decreased his dose to 600 mg b.i.d. from t.i.d. dosing.  I  reviewed the health and history form and 14-point review of systems.  The  patient continues to use oxycodone 5 mg four to six per day in addition to  the Duragesic 75 mcg.  He denies any further urinary incontinence.   PHYSICAL EXAMINATION:  GENERAL  APPEARANCE:  A healthy male in no acute  distress.  Blood pressure 130/78, pulse 85, respiratory rate 18, O2  saturation is 95% on room air.  The patient has significant difficulty  raising up from a seated position to standing secondary to knee and back  pain.  VITAL SIGNS:  HEENT:  NECK:  CHEST:  LUNGS:  CARDIOVASCULAR:  ABDOMEN:  EXTREMITIES:  Examination of his upper extremities reveals some mild  synovitis in the left second metacarpophalangeal joint  with pain on range  of motion of his upper and lower extremities diffusely, particularly at the  elbows, hands and knees.  There is no other heat, erythema or edema noted in  the upper and lower extremities.  BACK:  There is tenderness  to palpation bilateral lumbar paraspinal muscles  primarily on the left.  NEUROLOGIC:  Manual muscle testing is 5/5 bilateral lower extremities.  Sensory examination is intact to light touch bilateral lower extremities.  Muscle stretch reflexes are symmetric bilateral lower extremities.   IMPRESSION:  1. Post laminectomy syndrome.  2. Chronic low back pain.  3. Degenerative disc disease of lumbar spine, cervical spine and thoracic     spine.  4. Lumbar facet arthropathy.  5. Cervical spinal stenosis of unclear clinical significance.  6. Severe osteoarthritis with negative basic rheumatologic work-up including     negative ANA, negative rheumatoid factor, but with a slightly elevated     erythrocyte sedimentation rate at 76.    PLAN:  1. The patient and I discussed further treatment options.  This is an     extensive consultation of greater than 25 minutes' duration. We discussed     continuing with the Duragesic but decreasing the dosage as requested by     the patient; however, he would still have the problem with the occlusive     dressing.  With this, I would recommend transitioning him to another     longer acting medication that is slightly lower than equivalent doses as     we will try  to work on weaning him from the opioid therapy.  I will     prescribe Avinza 120 mg one p.o. q.d. #30 without refills.  Will also     provide him with a prescription for oxycodone 5 mg one to two p.o. t.i.d.     as needed #130 without refills.  This will be added to his current supply     of 48 oxycodone pills.  2. Instruct the patient to continue with Neurontin 600 mg b.i.d. for now and     begin decreasing by 300 mg every two to three days next week until he is     off of the Neurontin.  3. Continue with home exercise program.  4. Will refer to Dr. Corliss [name], rheumatologist, to evaluate for possible     seronegative rheumatoid arthritis.  5. The patient is to return to clinic in one month for reevaluation or     sooner as needed.   The patient was educated on the above findings and recommendations and  understands.  There were no barriers to communication.                                                Sondra Come, D.O.    JJW/MEDQ  D:  10/25/2002  T:  10/25/2002  Job:  [id number]   cc:   Danae Orleans. Venetia Maxon, M.D.  [address]  [place]  [place] [address]  Fax: [phone]

## 2011-04-24 NOTE — Op Note (Signed)
[facility]  Patient:    [name], [name]                        MRN: [id number] Proc. Date: 02/10/01 Adm. Date:  [id number] Attending:  Josie Saunders                           Operative Report  PREOPERATIVE DIAGNOSIS:  Lumbar spinal stenosis with lateral recess stenosis, L3 through S1 levels with degenerative disk disease, spondylosis and radiculopathy.  POSTOPERATIVE DIAGNOSIS:  Lumbar spinal stenosis with lateral recess stenosis, L3 through S1 levels with degenerative disk disease, spondylosis and radiculopathy.  OPERATION PERFORMED:  Decompressive lumbar laminectomy L3 through S1 levels.  SURGEON:  Danae Orleans. Venetia Maxon, M.D.  ASSISTANT:  Cristi Loron, M.D.  ANESTHESIA:  General endotracheal.  ESTIMATED BLOOD LOSS:  50 cc.  COMPLICATIONS:  None.  DISPOSITION:  To recovery.  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  It was elected to take him to surgery for decompressive lumbar laminectomy.  DESCRIPTION OF PROCEDURE:  [name] was brought to the operating room. Following satisfactory and uncomplicated induction of general endotracheal and placement of intravenous lines, he was placed in a prone position on the operating table in the Wilson frame.  The low back was then shaved, prepped and draped in the usual sterile fashion.  The area of planned incision was infiltrated with 0.25% Marcaine and 0.5% lidocaine with 1:200,000 epinephrine. Incision was made in the midline of the lumbar spine and carried from the L3 to sacral levels, carried to the lumbodorsal fascia.  It was then incised with electrocautery.  Subperiosteal dissection was performed exposing the L3 throughsacral laminae bilaterally.  A ____________ retractor was placed to facilitate exposure.  Intraoperative x-ray confirmed appropriate levels. Spinous processes of L5, L4 and the inferior  two thirds of L3 were removed. Using loupe magnification, decompression of the spinal canal was performed. The [facility] Max drill with the AM3 equivalent bur was then used to thin the laminae of L5, L4 and L3 bilaterally.  Laminectomies were then completed with Leksell and Kerrison rongeurs.  The lateral recesses of L3-4 and L4-5 and L5-S1 were then decompressed bilaterally using a variety of 2 and 3 mm Kerrison rongeurs under loupe magnification.  Initially, the right side was decompressed and the dental elevator was easily inserted out each of the neural foraminae.  Attention was then turned to the left side where a similar decompression was performed.   There was particularly marked spondylitic bony overgrowth and ligamentous hypertrophy on the left at the L4-5 and L5-S1 levels.  These levels were decompressed.  The nerve roots were felt to be well decompressed.  Hemostasis was obtained with bone wax.  The wound was copiously irrigated with bacitracin and saline.  The self-retaining retractor was was then removed.  The deep muscle layer was reapproximated with 1 Vicryl sutures and the lumbodorsal fascia was reapproximated with 0 and 1 Vicryl stitches. The subcutaneous tissues were reapproximated with 2-0 Vicryl and skin edges were reapproximated with 2-0 Vicryl interrupted inverted sutures.  The wound was dressed with benzoin and Steri-Strips, Telfa gauze and tape.  The patient was extubated in the operating room and taken to the recovery room in stable and satisfactory condition having tolerated the procedure well.  Counts were correct at the end of the case.  DD:  02/10/01 TD:  02/10/01 Job: [id number]

## 2011-04-24 NOTE — Consult Note (Signed)
[facility]  Patient:    JARRIN, STALEY Visit Number: [id number] MRN: [id number]          Service Type: PMG Location: [facility] Attending Physician:  Sondra Come Dictated by:   Sondra Come, D.O. Proc. Date: 06/28/02 Admit Date:  06/27/2002                            Consultation Report  Mr. Dasch returns to clinic today as scheduled for reevaluation.  The patient was last seen on May 29, 2002.  In the interim patient states that over the past few weeks he has been feeling better overall.  He rates his pain as a 4/10 on a subjective scale.  His function and quality of life indexes have improved.  He continues on Duragesic 50 mcg patches q.48h. with roxicodone 15 mg one p.o. daily to b.i.d. for breakthrough pain.  He states that he typically has been using this b.i.d.  He brings his medications and his patches with him and he is appropriate on count.  I review health and history form and 14 point review of systems.  The patient has had one episode of urinary incontinence in the past several months.  He denies any saddle anesthesia or new neurologic complaints.  He continues exercising, walking 30-45 minutes daily followed by a light upper extremity workout.  PHYSICAL EXAMINATION  GENERAL:  Healthy male in no acute distress.  NEUROLOGIC:  Manual muscle testing is 5/5 bilateral lower extremities. Sensory examination is intact to light touch bilateral lower extremities. Muscle stretch reflexes are symmetric bilateral lower extremities.  IMPRESSION: 1. Post laminectomy syndrome. 2. Chronic low back pain. 3. Degenerative disk disease of the lumbar spine status post multilevel lumbar    laminectomy. 4. Lumbar facet syndrome. 5. Urinary incontinence of uncertain etiology.  PLAN: 1. Continue Duragesic 50 mcg one q.48h. #15 without refills. 2. Continue roxicodone 15 mg one-half to one p.o. b.i.d. as needed for    breakthrough pain #60 without refills.   If patients breakthrough pain    medication usage continues to be consistent at b.i.d. dosing, will consider    increasing patients Duragesic to 75 mcg. 3. Continue Neurontin 600 mg one p.o. t.i.d. 4. Continue home exercise program. 5. Follow up with Dr. Venetia Maxon as scheduled next month. 6. Continue glucosamine and chondroitin. 7. Will monitor urinary issues.  If has continued episodes of incontinence    will consider a urology consultation for possible urodynamic studies. 8. The patient is to return to clinic in one month for reevaluation.  The patient was educated on the above findings and recommendations and understands.  No barriers to communication. Dictated by:   Sondra Come, D.O. Attending Physician:  Sondra Come DD:  06/28/02 TD:  07/02/02 Job: [id number]

## 2011-04-24 NOTE — Consult Note (Signed)
[facility]  Patient:    [name], [name] Visit Number: [id number] MRN: [id number]          Service Type: PMG Location: [facility] Attending Physician:  Sondra Come Dictated by:   Sondra Come, D.O. Proc. Date: 02/15/02 Admit Date:  12/20/2001                            Consultation Report  INTERVAL HISTORY:  [name] returns to clinic today as scheduled for re-evaluation.  I last saw him on January 30, 2002.  [name] states that he has been doing very well over the last two to three days with significant improvement in his symptoms.  His pain today is 4/10 on a subjective scale. He still has some considerable pain in his low back when going from sitting to standing, having a hard time standing up straight.  He continues on Percocet 10 mg three times daily and sometimes up to four times a day on a bad day.  He believes that he can start to decrease this, since he is feeling some better. He continues to walk four miles daily over the past two days.  His sleep is better.  His function and quality of life indices have improved.  He continues taking Neurontin 300 mg t.i.d. with improvement in his lower extremity radicular pain.  I reviewed health and history form and 14-point review of systems.  PHYSICAL EXAMINATION:  GENERAL:  A healthy male in no acute distress.  VITAL SIGNS:  Blood pressure is 144/65, pulse 93, respirations 18, O2 saturation 97% on room air.  MUSCULOSKELETAL/NEUROLOGIC:  Manual muscle testing is 5/5 bilateral lower extremities.  Sensory exam is intact to light touch bilateral lower extremities.  Muscle stretch reflexes are 2+/4 bilateral lower extremities.  IMPRESSION: 1. Chronic low back pain, multifactorial, improving. 2. Lumbar facet syndrome, six weeks status post radiofrequency neuroablation    of the left medial branches. 3. Degenerative disk disease of the lumbar spine, status post lumbar    laminectomy.  PLAN: 1. Mr.  [name] and I had a thorough discussion regarding pain medications and    treatment options.  We discussed short- versus long-acting opiate    analgesics for his pain.  At this point I think it is reasonable to    continue on the short-acting, since the patient feels that he can start to    wean this medication without being detrimental to his functional status.  I    will write Percocet 10/650 mg one p.o. up to q.i.d. as needed, #90 without    refills.  The patient was educated and counseled on the use of this    medication.  If it seems that he is requiring around-the-clock analgesia    with this medicine, we will consider transitioning him over to a    longer-acting agent such as Duragesic. 2. Increase Neurontin to 300 mg q.i.d., #360 without refills for a 90-day    supply, which he will mail in to his mail-order pharmacy.  I have given him    28 packets of three tablets each of Neurontin 300 mg to bridge him while he    waits for his mail prescription. 3. I have given the patient samples of Lidoderm patch, #4, to use on his low    back.  If this gives him some relief, we will call in a prescription. 4. Patient to continue off of work over  the next month, since has had no light    duty that he can return to in the [employer] department. 5. Patient to continue current home-based exercise program. 6. Patient to return to clinic in one month for re-evaluation.  The patient was educated on the above findings and recommendations and understands.  There were no barriers to communication. Dictated by:   Sondra Come, D.O. Attending Physician:  Sondra Come DD:  02/15/02 TD:  02/16/02 Job: [id number]

## 2011-04-24 NOTE — Consult Note (Signed)
NAMEELEANOR, [name]                           ACCOUNT NO.:  [id number]   MEDICAL RECORD NO.:  [id number]                   PATIENT TYPE:  REC   LOCATION:  [facility]                                  FACILITY:  [facility]   PHYSICIAN:  Sondra Come, D.O.                 DATE OF BIRTH:  02-16-1951   DATE OF CONSULTATION:  11/06/2002  DATE OF DISCHARGE:                                   CONSULTATION   REASON FOR CONSULTATION:  The patient returns to the clinic today for  reevaluation.  He was last seen on October 25, 2002; at which time, I  transitioned him from Duragesic to Avinza.  We switched him to Avinza 120 mg  as noted in the previous note; however, two days after the transition, the  patient called complaining of withdrawal symptoms including shaking, chills,  severe increase in his pain.  At that point, I instructed the patient to  increase his Avinza to 120 mg 2 p.o. q.d.   He returns today stating that he feels the best that he has felt in several  months.  His pain today is a 1/10 on subjective scale.  His function and  quality of life indices have improved significantly.  He states that he is  needing far less breakthrough pain medication.  He is not having any adverse  effects from the Avinza.  He denies any breathing difficulties.  Denies any  significant constipation.  I reviewed the health and history form and 14-  point review of systems.  The patient still ultimately would like to  decrease the narcotic dosage if possible; however, I think it is reasonable  at this point to continue with the current medications as he is doing so  well.   PHYSICAL EXAMINATION:  GENERAL:  Reveals a healthy-appearing male in no  acute distress.  VITAL SIGNS:  Blood pressure 129/65, pulse 80, respirations 18.  O2  saturations 98% on room air.  NEUROLOGICAL:  The patient is able to go from sitting to standing with  significant ease.  This is significantly improved compared to previous  visits.   Neurologically intact in the upper and lower extremities.  Range of  motion of the joints is full today without any significant discomfort.  There is tenderness to palpation in the lumbar paraspinous muscles without  any significant change from previous.   IMPRESSION:  1. Postlaminectomy syndrome.  2. Chronic low back pain.  3. Degenerative disc disease of lumbar spine.  4. Degenerative disc disease of the cervical spine.  5. Degenerative disc disease of the thoracic spine.  6. Lumbar facet arthropathy.  7. Cervical spinal stenosis of unclear clinical significance.  8. Severe osteoarthritis.   PLAN:  1. Discussed further treatment options with the patient.  2. Recommend continuing Avinza 120 mg 2 p.o. q.d. #60 with without refills.     This is to  be filled on or after November 10, 2002 as the patient has six     pills remaining.  3. Continue with Oxycodone for breakthrough pain just as needed.  4. Continue home exercise program.  5. Patient is to return to the clinic in one month for reevaluation.  At     that point, we will discuss trying to decrease his dosage of Morphine as     tolerated.    DISPOSITION:  The patient was educated in the above findings,  recommendations, and understands.  There were no barriers to communication.                                               Sondra Come, D.O.    JJW/MEDQ  D:  11/06/2002  T:  11/06/2002  Job:  [id number]

## 2011-04-24 NOTE — Consult Note (Signed)
[facility]  Patient:    [name], [name] Visit Number: [id number] MRN: [id number]          Service Type: Attending:  Sondra Come, D.O. Dictated by:   Sondra Come, D.O. Proc. Date: 05/29/02                            Consultation Report  REASON FOR FOLLOWUP:  The patient returns to clinic today as scheduled for reevaluation.  He was last seen on May 19, 2002.  In the interim, he continues on 50 mcg Duragesic patch with oxycodone for breakthrough pain.  He has been taking three to four oxycodone 5 mg per day and we discussed his breakthrough pain medication usage.  Currently, his pain is 4/10 on a subjective scale.  His function and quality-of-life indices remain stable but he states that they are somewhat improved.  I reviewed the health and history form and 14-point review of systems.  The patient complains of the feeling of weakness and shaking in his legs on standing initially, which tends to resolve after he moves around for a little while; he also gets this same feeling after prolonged standing.  He denies any bowel and bladder dysfunction at this time. He continues to have pain radiating to his left lower extremity, which is improved with the increase of Neurontin to 1800 mg per day.  PHYSICAL EXAMINATION:  GENERAL:  Physical examination reveals a healthy male in no acute distress.  VITAL SIGNS:  Blood pressure 134/54, pulse 87, respirations 16, O2 saturation 95% on room air.  NEUROMUSCULAR:  Gait is slightly antalgic.  The patient does have a difficult time standing up straight as he is sort of bend over with lumbar and neck flexion.  Manual muscle testing is 5/5, bilateral lower extremities.  Sensory exam intact to light touch, bilateral lower extremities.  Muscle stretch reflexes are symmetric, bilateral lower extremities.  Palpatory examination reveals tenderness to palpation in bilateral lumbar paraspinals but with  deep palpation.  IMPRESSION: 1. Post-laminectomy syndrome. 2. Chronic low back pain. 3. Degenerative disk disease of the lumbar spine, status post multilevel    lumbar laminectomy. 4. Lumbar facet syndrome.  PLAN: 1. Again, I had a thorough discussion with the patient regarding treatment    options.  At this point, we will continue with Duragesic 50 mcg one q.48h,    #15 without refills. 2. We will switch his breakthrough pain medication to Roxicodone 15 mg    one-half to one p.o. t.i.d. as needed for breakthrough pain.  We will    monitor the patients usage patterns.  If he continues to use this on a    consistent basis, we will consider increasing the Duragesic to 75 mcg. 3. Discussed further alternative therapeutic interventions such as    acupuncture, massage therapy.  The patient is somewhat reluctant to pursue    alternative course of action. 4. Continue Neurontin 1800 mg per day, new prescription for 600 mg t.i.d.,    three-month supply written. 5. Continue Osteo-Bi-Flex, glucosamine and chondroitin. 6. The patient is to return to clinic in one month for reevaluation or sooner    as needed.  The patient was educated in the above findings and recommendations and understands.  No barriers to communication. Dictated by:   Sondra Come, D.O. Attending:  Sondra Come, D.O. DD:  05/29/02 TD:  05/30/02 Job: [id number]

## 2011-04-24 NOTE — Consult Note (Signed)
NAMEAnikin, [name]                             ACCOUNT NO.:  [id number]   MEDICAL RECORD NO.:  [id number]                   PATIENT TYPE:   LOCATION:                                       FACILITY:   PHYSICIAN:  Sondra Come, D.O.                 DATE OF BIRTH:   DATE OF CONSULTATION:  08/28/2002  DATE OF DISCHARGE:                                   CONSULTATION   REASON FOR CONSULTATION:  The patient returns to clinic today as scheduled  for re-evaluation.  He was last seen on 07/26/02.  In the interim, he  describes a new pain in his left thoracic paraspinous region over the past  few weeks.  He continues to have low back pain radiating into his left groin  without any significant changes.  He also complains of pain in his left  hand, pointing to his first and second metacarpophalangeal joints.  He  denies any redness or heat involving that joint.  He has followed up with  Dr. Venetia [name] in the interim as well.  MRI of the cervical spine revealed mild  stenosis without any cord impingement.  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 Venetia [name] does not feel is contributing to his current thoracic  pain.  He describes this pain as sharp, occurring with movement and also  with taking a deep breath.  His pain is a 6/10 on a subjective scale.  He  continues on Duragesic 50 mcg q.48h., but is having significant breakthrough  pain over the past month, having to increase his Roxicodone to t.i.d.  We  have been discussing increasing the Duragesic patch over the last few  visits, and the patient has been somewhat resistant, although we discuss  this further.  I think it is reasonable as he continues to have significant  breakthrough.  I reviewed health and history form and 14 point review of  systems.  He denies any further incontinent episodes.   PHYSICAL EXAMINATION:  GENERAL:  A healthy male in no acute  distress.  VITAL SIGNS:  Blood pressure is 123/69, pulse 95, respirations 18, O2  saturation 98% on room air.  The patient does have some discomfort with  getting up from the chair and ambulating to the examination table.  BACK/NEUROLOGIC:  He maintains a somewhat lumbar flexed position and listing  to the left when standing.  There is a significant tenderness to palpation  in the mid thoracic paraspinous region at approximately T5 on the left.  Palpation of the paraspinous muscles reproduces his symptoms.  There is  decreased rotational component with a tight ropey paraspinal texture.  Manual muscle testing is 5/5 bilateral lower extremities.  Sensory  examination is intact to light touch bilateral lower extremities at this  time.  Muscle stretch reflexes are  symmetric bilateral lower extremities.  Gait is antalgic.   IMPRESSION:  1. Thoracic back pain with myofascial component.  2. Chronic low back pain.  3. Degenerative disk disease of the spine, including cervical, thoracic, and     lumbar.  4. Lumbar facet arthropathy.  5. Cervical spinal stenosis.  6. Post-laminectomy syndrome.   PLAN:  1. Increase Duragesic to 75 mcg q.48h. #15 without refills.  2. Continue Roxicodone 15 mg one p.o. b.i.d. p.r.n. breakthrough pain, #60     without refills.  3. Continue Neurontin 600 mg one p.o. t.i.d.  4. Osteopathic manipulative therapy to include myofascial release and gentle     high velocity low amplitude technique to the thoracic spine.  5. Continue home exercise program.  6. Continue glucosamine and chondroitin.  7. Consider thoracic paraspinous trigger point injections if symptoms are     not improving versus further manipulative therapy.  8. We will obtain laboratory work, to include rheumatoid factor, ANA, ESR,     and CBC to rule out any concomitant connective tissue or autoimmune     disorder.  9. The patient is to return to clinic in one month for re-evaluation, sooner     as  needed.   The patient was educated on the above findings and recommendations and  understands.  There were no barriers to communication.                                                Sondra Come, D.O.    JJW/MEDQ  D:  08/28/2002  T:  08/28/2002  Job:  [id number]   cc:   [name]. [name], M.D.  [address]  [place]  [place] [address]  Fax: [phone]

## 2011-04-24 NOTE — Consult Note (Signed)
[facility]  Patient:    [name], [name] Visit Number: [id number] MRN: [id number]          Service Type: PMG Location: [facility] Attending Physician:  Sondra Come Dictated by:   Sondra Come, D.O. Proc. Date: 02/16/02 Admit Date:  12/20/2001   CC:         Cristi Loron, M.D.   Consultation Report  Mr. Hallmon returns to clinic today as scheduled.  He is two weeks status post left lumbar medial branch neurotomy by radiofrequency ablation.  Patient states that his pain is significantly worse now over the past several days. He states that following the procedure he had significant relief for approximately three to four days but on that third or fourth day he was getting up from the couch and noticed a pop in his back and began experiencing severe left-sided low back pain.  Prior to that he states he had resumed walking four miles on two occasions and was also going to the gym and performing upper body weightlifting exercises without any significant difficulty.  Currently, his pain is a 6/10 on a subjective scale.  He feels like initially he was unable to stand up straight.  He states that this is becoming easier, but still he has a difficult time with this secondary to pain.  He notices some pop and a click in his back.  His function and quality of life indexes have declined some and his sleep is fair.  He continues taking Percocet five times per day with some reluctance, but states he needs to secondary to his pain.  He seems somewhat discouraged and depressed because he is thinking about filing for disability from his job at [employer] department and he this is giving him some significant emotional strain.  In terms of patients low back pain, he continues to have pain radiating into his left buttock, left hip region, groin with some burning sensations in the anterior thigh on the left which seems to be mildly worse than previous, but in the same  distribution.  His symptoms are worse with walking, bending, sitting, and working and improved with rest, heat, ice, and medications.  His pain is sharp, burning, and stabbing.  Patient denies any bowel and bladder dysfunction.  He denies any new distribution radicular symptoms.  Denies any lower extremity weakness.  Denies any numbness.  I review the health and history form and 14 point review of systems.  PHYSICAL EXAMINATION  GENERAL:  Healthy male in no acute distress.  VITAL SIGNS:  Blood pressure 105/61, pulse 93, respirations 18, O2 saturation 98%.  NEUROLOGIC:  Gait is mildly antalgic with a single point cane.  Manual muscle testing is 5/5 bilateral lower extremities.  Sensory examination is intact to light touch bilateral lower extremities at this time.  Muscle stretch reflexes are 2+/4 bilateral patellar, medial hamstrings, and Achilles.  Straight leg raise is negative bilaterally.  BACK:  Tenderness to palpation over the left lumbosacral junction and down into the left buttock.  There is no heat, erythema, or edema suggestive of any infection in the lumbar region.  Range of motion is limited secondary to pain.  IMPRESSION: 1. Lumbar facet syndrome. 2. Degenerative disk disease of the lumbar spine status post lumbar    laminectomy. 3. Exacerbation of low back pain which by history and physical examination    appears to be mechanical in nature.  PLAN: 1. I had a long discussion with Mr. Hara regarding his  pain and treatment    options.  I explained to Mr. Newcom that there is a certain percentage of    patients that are status post radiofrequency ablation of the lumbar medial    branches that get increased pain in their low back; however, it sounds like    he may have exacerbated his symptoms to some degree following the procedure    with activity, although I am not certain of this.  I believe that this    current exacerbation will pass with the use of medications  judiciously. 2. In terms of medications, I will prescribe Bextra 20 mg one p.o. b.i.d. for    seven days, then one p.o. q.d.  I have given him nine samples and I gave    him a prescription for #30 without refills. 3. Will prescribe Neurontin 300 mg one p.o. t.i.d. #90 without refills.  I    have given him seven days worth of samples to be taken one p.o. q.h.s. x3    days, then b.i.d. x3 days, then he can start t.i.d. 4. Will continue Percocet 5/325 mg one to two p.o. q.4h. p.r.n. severe pain    #60 without refills.  Patient and I discussed this at length and he again    expresses his desire to eventually get off of the narcotic based pain    medicine and I think this is a reasonable goal.  Patient denies risk to    harm himself or others.  He has signed a controlled substance agreement and    we discussed single Keyira Mondesir for his pain medications which he was    previously getting from his family Arrionna Serena.  At this point I would be    happy to continue to be the sole Tayler Lassen of his narcotic based pain    medicine.  Patient agrees. 5. Patient to return to clinic in two weeks for reevaluation.  Patient was educated on the above findings and recommendations and understands.  There were no barriers to communication. Dictated by:   Sondra Come, D.O. Attending Physician:  Sondra Come DD:  01/19/02 TD:  01/20/02 Job: [id number]

## 2011-04-24 NOTE — Consult Note (Signed)
[facility]  Patient:    [name], [name] Visit Number: [id number] MRN: [id number]          Service Type: PMG Location: [facility] Attending Physician:  Sondra Come Dictated by:   Sondra Come, D.O. Proc. Date: 01/05/02 Admit Date:  12/20/2001   CC:         [name], M.D.   Consultation Report  [name] to clinic today for left lumbar medial branch neurotomy procedure.  He was last seen on January 02, 2002.  He underwent left lumbar medial branch blocks and an L5 dorsal ramus block on December 23, 2001 with greater than 50% relief of his left low back pain diagnostically.  I have reviewed the procedure with [name] including the risks, benefits, limitations, and alternatives.  He wishes to proceed.  He denies any new neurologic complaints.  I review the health and history form and 14 point review of systems.  Patients pain level today is a 5/10 on a subjective scale.  His function and quality of life indexes are stable, but overall declined.  His sleep is fair to good.  PHYSICAL EXAMINATION  GENERAL:  Healthy male in no acute distress.  VITAL SIGNS:  Blood pressure 138/73, pulse 73, respirations 12, O2 saturation 96% on room air.  NEUROLOGIC:  No new neurologic findings in the lower extremities including motor, sensory, and reflexes.  BACK:  There is tenderness to palpation left greater than right lumbar paraspinals.  IMPRESSION: 1. Left lumbar facet syndrome. 2. Status post lumbar laminectomy.  PLAN: 1. Left L2, L3, L4 medial branch neurotomy, L5 dorsal ramus neurotomy by    radiofrequency ablation.  Procedure was described to patient including    risks, benefits, limitations, and alternatives.  Patient wished to proceed.    Informed consent was obtained.  Patient was brought back to the fluoroscopy    suite where an IV was started.  Patient was placed on the table in prone    position.  He was given an initial dose of  Versed 2 mg.  He was given an    additional 3 mg of Versed throughout the procedure.  Heart rate and pulse    oximetry were monitored consistently throughout the procedure.  Skin was    prepped and draped in the usual sterile fashion.  Skin and subcutaneous    tissue was anesthetized with 8 cc of 1% lidocaine at four independent    needle access points.  Under direct fluoroscopic guidance a 20 gauge    Racz-Finch RF needle with 10 mm precurved active tip was advanced to the    junction of the left sacral ala and S1 superior articular process.  Using    independent needle access points, the RF needle was advanced to the    junction of the L3, L4, and L5 superior articular processes with their    corresponding transverse processes.  Once bone was contacted needle tip    placement was confirmed in multiple fluoroscopic views including AP,    oblique, and lateral.  Further confirmation was obtained with the injection    of 0.25 cc of Omnipaque 180 at each location.  No vascular or epidural    uptake noted.  Appropriate local spread of the contrast was viewed.  Each    site was then stimulated appropriately for sensory and motor response.    This was then followed with the injection of 1 cc of preservative free 1%  lidocaine at each location.  Each site was then lesioned at 70 degrees for    70 seconds.  Patient tolerated the procedure well.  No complications.    Patient was monitored post injection.  IV was discontinued.  Patient was    released in stable condition with his wife.  Discharge instructions were    given. 2. Patient to continue current medications. 3. Patient to use ice liberally for 24-48 hours. 4. Patient to return to clinic in two weeks for reevaluation.  Patient was educated on the above findings and recommendations and understands.  There were no barriers to communication. Dictated by:   Sondra Come, D.O. Attending Physician:  Sondra Come DD:  01/05/02 TD:   01/05/02 Job: [id number]

## 2011-04-24 NOTE — H&P (Signed)
. [facility]  Patient:    [name]                        MRN: [id number] Adm. Date:  [id number] Attending:  Josie Saunders                         History and Physical  REASON FOR ADMISSION:  Lumbar spinal stenosis with spondylosis, degenerative disk disease, and lumbar radiculopathy.  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 Sudie Bailey for neurosurgical consultation for left leg pain and low back pain.  I initially saw him in my office on December 29, 2000.  He notes that he has had low back pain for approximately six years but has had increasing left leg pain and now complains of pain into the left buttock and also into his hip along the back of his and to his calf down to his heel.  He says that it is not usually in his foot.  He says he cannot stand or sit for very long and it is relieved with lying down.  He leans forward when he is walking and this gives him some relief.  He denies any bowel or bladder dysfunction.  He does note weakness in his left leg.  He has been using Lorcet Plus up to two to three times per week for pain.  He also complains of some occasional right lower extremity pain. He says he used to run eight miles a day but now walks four miles a day, is not engaged in any physical therapy for his back pain but does stretches on a regular basis, and exercises six days a week.  He has taken Celebrex and Vioxx and says these are not helpful and the Vioxx made him nauseated and dizzy.  He had ACL reconstructive surgery on the right in June 2001.  Mr. Sabol had an MRI of his lumbar spine performed at [facility] on October 27, 2000 which demonstrated some moderate disk space narrowing and diffuse disk bulge at the L3-4 level resulting in mild canal and bilateral neural foraminal stenosis.  There appears to be moderate  left lateral disk bulge and spur formation at that level with some flattening of the L3 nerve root lateral to the neural foramen.  At the L4-5 level there is mild to moderate diffuse posterior disk bulging and bilateral ligamentum flavum hypertrophy causing moderate spinal stenosis without significant neural foraminal stenosis.  He had AP and lateral lumbar x-rays which showed some multilevel degenerative changes.  There is moderate bilateral lateral spur formation at the L1-2 level, marked left lateral spur formation at L2-3 and L3-4, moderate disk space narrowing and mild posterior spur formation at the L3-4 level.  No pars defects or subluxations were identified.  REVIEW OF SYSTEMS:  A detailed review of systems sheet was reviewed with the patient.  Pertinent positives:  EYES:  He wears glasses and has cataracts. EARS, NOSE, MOUTH, AND THROAT:  He notes hearing loss and sinus problems. CARDIOVASCULAR:  He notes high blood pressure, leg pain with walking. MUSCULOSKELETAL:  He notes leg weakness, back pain, leg pain, joint pain or swelling, and arthritis.  ALLERGIC/IMMUNIOLOGIC:  He notes inhaled nasal allergies.  All other systems negative.  PAST MEDICAL HISTORY/CURRENT MEDICAL CONDITIONS:  He has a history of high blood  pressure.  He has reflux disease and takes Prilosec for that.  He had a cyst on his back removed.  He has no lung disease.  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  PRIOR OPERATIONS AND HOSPITALIZATIONS:  ACL transplant reconstruction of the right knee - May 31, 2000.  Metal removed from ACL surgery - October 25, 2000.  Cyst removed from beneath his right shoulder blade and on his back - November 11, 2000.  CURRENT MEDICATIONS: 1. Prinivil 10 mg daily. 2. Allegra 180 mg daily. 3. Alprazolam 1 mg at bedtime to help him sleep. 4. Lorcet Plus 7.5/650 one at bedtime to help him for back and leg pain. 5. Prilosec 20 mg daily as needed for  indigestion.  ALLERGIES:  CECLOR which causes him to be nervous and stay up.  VIOXX causes nausea, dizziness, and makes him jittery.  HEIGHT AND WEIGHT:  [name] is 5 feet 8 inches tall, 200 pounds.  FAMILY HISTORY:  Mother is 18 in good health with arthritis and high blood pressure.  Father is 80 in good health with arthritis.  SOCIAL HISTORY:  [name] is married.  He is a Midwife.  He is a nonsmoker, nondrinker.  No history of substance abuse.  No recent weight gain or loss.  DIAGNOSTIC STUDIES:  As above.  PHYSICAL EXAMINATION:  GENERAL APPEARANCE:  [name] is a fit-appearing middle-aged white male in no acute distress.  HEENT:  Normocephalic, atraumatic.  Pupils are equal, round and reactive to light.  Extraocular movements are full.  Sclerae white, conjunctivae pink. Oropharynx benign, uvula midline.  NECK:  There are no masses, meningismus, deformities, tracheal deviation, jugular venous distention, or carotid bruits.  There is normal cervical range of motion.  Spurling test is negative without reproducible radicular pain turning the patients head to either side.  Lhermittes sign is not present with axial compression.  RESPIRATORY:  There is normal respiratory effort with good intercostal function.  Lungs are clear to auscultation.  There are no rales, rhonchi, or wheezes.  CARDIOVASCULAR:  The heart has regular rate and rhythm to auscultation.  No murmurs are appreciated.  There is no extremity edema, clubbing, or cyanosis. There are palpable pedal pulses.  ABDOMEN:  Soft, nontender, no hepatosplenomegaly appreciated or masses.  There are active bowel sounds, no guarding or rebound.  MUSCULOSKELETAL:  The patient is able to walk about the examining room with a normal heel-toe and casual gait.  He has minimal midline low back pain to palpation.  He has left sciatic notch discomfort, negative on the right.  He does have decreased ability to heel walk on  the left compared to the right. He is able to squat independently on either lower extremity without any apparent weakness.  He is able to bend to within 4 inches of floor with  outstretched upper extremities.  Straight leg is negative on the right, moderately positive on the left.  Patricks test is negative bilaterally.  NEUROLOGIC:  Patient is oriented to time, person, and place.  He has good recall of both recent and remote memory with normal attention span and concentration.  The patient speaks with clear and fluent speech and exhibits normal language function and appropriate fund of knowledge.  Cranial nerve examination reveals the pupils are equal, round and reactive to light. Extraocular muscles are full.  Visual fields are full to confrontational testing.  Facial sensation and facial motor are intact and symmetric.  He is hard of hearing,  particularly in the right ear.  Palate is upgoing.  Shoulder shrug is symmetric.  Tongue protrudes in the midline.  Motor examination 5/5 in bilateral deltoids, biceps, triceps, hand grips, wrist extensors, interossei.  In the lower extremities motor strength is 5/5 in hip flexion, extension, quadriceps, hamstrings, plantar flexion, dorsiflexion, and 5/5 right extensor hallucis longus.  Decreased extensor hallucis longus strength on the left at 4/5.  He has gluteus weakness at 4/5 on the left.  Sensory examination normal to pinprick and light touch in the upper and lower extremities with the exception of decreased pin sensation in L5 distribution. Deep tendon reflexes are 2 in the biceps, triceps, and brachioradialis; 2 at the knees; 2 at the ankles.  Great toes are downgoing to plantar stimulation Cerebellar examination shows normal coordination in the upper and lower extremities.  Normal rapid alternating movements.  Romberg test is negative.  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  This has been going on for six years but has recently become more intense.  He wants to have something done about it. I felt that a lumbar myelogram would be useful to better clarify the exact nature of the nerve root compression and he underwent this.  I saw him back in the office on January 25, 2001.  At that time, he returned with his myelogram and post myelographic CT along with his wife.  This showed lateral recess stenosis and moderate spinal stenosis at the L3-4 and L4-5 levels.  There is also central annular disk bulge at L5-S1 with synarthrophysis, left greater than right, with severe left foraminal stenosis at the L5-S1 level.  He has multilevel degenerative disk disease with osteophytic spurs at both of these levels.  I recommended to Mr. Pew that he undergo decompressive lumbar laminectomy L3-S1 levels.  I reviewed models, went over his radiographs with him and his wife, and answered their questions.  They wished to proceed with surgery and this has been set up for February 10, 2001.  We also discussed typical hospital course and operative and postoperative course and the potential risks and benefits of surgery.  The risks of surgery were discussed in detail, and include but are not limited to the risk of anesthesia; blood loss; the possibility of hemorrhage; infection; damage to nerves, damage to blood vessels; injury to lumbar nerve root causing either temporary or permanent leg pain, numbness, and/or weakness.  There is the potential for spinal fluid leak from dural tear.  There is potential for post laminectomy spondylolithesis, recurrent disk rupture quoted at approximately 10%, failure to relieve pain, worsening of pain, need for further surgery.DD: 02/10/01 TD:  02/10/01 Job: [id number]

## 2011-04-24 NOTE — Consult Note (Signed)
[facility]  Patient:    SUMEET, GETER Visit Number: [id number] MRN: [id number]          Service Type: PMG Location: [facility] Attending Physician:  Sondra Come Dictated by:   Sondra Come, D.O. Proc. Date: 03/30/02 Admit Date:  03/28/2002   CC:         Dr. Lovell Sheehan   Consultation Report  Mr. Nuzzo returns to clinic today as scheduled for reevaluation.  He has been doing fairly well on Duragesic 50 mcg patch q.48h. until yesterday when he had a flare-up of his low back pain pointing to his left lumbosacral region.  He states the pain radiates from there into his buttock and into his groin.  His pain is a stabbing and throbbing sensation.  This has seemed to ease off a bit and his pain today is a 3/10 on a subjective scale.  His function and quality of life indexes remain improved and his sleep is good.  He continues walking four miles every other day and does very light strengthening exercises on other days.  He states that he has gone several days without needing any Percocet for breakthrough pain.  In fact, he has 35-40 pills left on his past prescription.  He does have one patch remaining and is due to change his current patch tomorrow.  I review the health and history form and 14 point review of systems.  No bowel and bladder dysfunction.  PHYSICAL EXAMINATION  GENERAL:  Healthy male in no acute distress.  BACK:  He has significant tenderness to palpation over the left L5-S1 region.  NEUROLOGIC:  Manual muscle testing is 5/5 bilateral lower extremities. Sensory examination is intact to light touch bilateral lower extremities. Muscle stretch reflexes are 2+/4 bilateral patellar, medial hamstrings, and Achilles.  IMPRESSION: 1. Chronic low back pain, multifactorial, improved overall with occasional    exacerbation. 2. Lumbar facet syndrome.  I still feel that significant component of    patients pain is secondary to the lumbar facets and  possibly mainly the    L5-S1 facet joint given the locality of his current pain at this time. 3. 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  PLAN: 1. Continue Duragesic 50 mcg q.48h. #15 without refills. 2. Continue Percocet for breakthrough pain.  New prescription for 10 mg one    p.o. b.i.d.-t.i.d. p.r.n. for breakthrough pain #50 without refills.  This    will not be filled until Apr 06, 2002.  This in combination with his current    supply should last him for one month. 3. Motrin 800 mg one p.o. t.i.d. p.r.n. with food #90 with one refill. 4. Consider an intra-articular lumbar facet injection if patient continues to    have exacerbations.  I would have anticipated him to get more relief at    this point with the lumbar radiofrequency ablation procedure given his    significantly positive response on medial branch block.  Nevertheless, he    continues to have some mechanical component to his pain and we discussed    this. 5. If lumbar fusion is being entertained, I would recommend lumbar discography    first.  In that case I would certainly want to rule out a component of    facet mediated pain prior to that type of surgery.  I discussed this with  the patient.  He understands. 6. Patient to return to clinic in one month.  Patient was educated on the above findings and recommendations and understands.  No barriers to communication. Dictated by:   Sondra Come, D.O. Attending Physician:  Sondra Come DD:  03/30/02 TD:  03/31/02 Job: [id number]

## 2011-04-24 NOTE — H&P (Signed)
NAMETristyn, [name]                             ACCOUNT NO.:  [id number]   MEDICAL RECORD NO.:  [id number]                  PATIENT TYPE:   LOCATION:                                       FACILITY:   PHYSICIAN:  [name], M.D.                 DATE OF BIRTH:  26-Mar-1951   DATE OF ADMISSION:  DATE OF DISCHARGE:                                HISTORY & PHYSICAL   CHIEF COMPLAINT:  Heme positive stools x2 per Dr. Michelle [name] office.   HISTORY OF PRESENT ILLNESS:  Mr. [name] is a 60 year old Caucasian male  who presented to our office for evaluation of heme positive stools x2 on  August 14, 2003 per Dr. Michelle [name] office.  The patient also presents with  normocytic anemia and hemoglobin of 12.7 and hematocrit of 37.1 with MCV of  84.7 from laboratory studies on July 24, 2003.  He was also shown to have  a slight elevation of AST and ALT at 42 and 48 respectively.  [name]  denies any GI history or surgeries.  He currently takes Prilosec for GERD  with good relief.  He reports that he was started on methotrexate for  arthritis by Dr. Michelle [name] office in which he takes 5 weekly.  Since he has  been taking this medication he has reported increasing fatigue.  He has also  experienced some constipation and having bowel movements irregularly  requiring him to take Senokot approximately six tablets daily which results  in a bowel movement every day or twice a day which is soft and brown.  He  denies any bright red blood per rectum.  He denies any melena.  He denies  any history of hemorrhoids.  He denies any history of PUD.  He reports  occasional rectal pain with sensation of feeling raw.  He is 60 years old  and has never had colonoscopy.  His maternal grandfather had a history of  rectal carcinoma in his 20s.   PAST MEDICAL HISTORY:  1. Arthritis (Dr. Andrey [name] is his pain management physician).  2. Hypertension.  3. Anxiety.   CURRENT MEDICATIONS:  1. Neurontin 300 mg  b.i.d.  2. MS Contin 30 mg b.i.d.  3. Oxycodone 5 mg t.i.d. p.r.n.  4. Lexapro 10 mg daily.  5. Prinivil 10 mg daily.  6. Allegra one tablet daily.  7. Xanax 1 mg to 2 mg p.r.n.  8. Senokot laxative six tablets q.p.m.  9. Methotrexate five tablets weekly.  10.      Prilosec 20 mg daily.  11.      Phenergan 25 mg p.r.n. nausea.  12.      Folic acid 800 mcg daily.  13.      Centrum multivitamin.   ALLERGIES:  VIOXX, BEXTRA, and CECLOR.    PAST SURGICAL HISTORY:  1. Right knee ACL repair x2.  2. Three-stage laminectomy of L3, 4, and  5.   FAMILY HISTORY:  Maternal grandfather with rectal carcinoma in his 53s.  Both mother (62) and father (1) are in good health.  He has two brothers,  both of which are in good health.   SOCIAL HISTORY:  He has been married for approximately 32 years and has two  sons, both of which are in good health.  He is currently disabled secondary  to chronic pain from back pain as well as arthritis.  He is a retired  [employer].  He denies any tobacco, alcohol, or drug use.   REVIEW OF SYSTEMS:  CONSTITUTIONAL:  Denies any fever or chills.  Reports  stable appetite.  Weight is apparently up a few pounds.  He is complaining  of severe fatigue.  CARDIOVASCULAR:  He denies any chest pain or  palpitations.  PULMONARY:  He denies any shortness of breath, dyspnea on  exertion, or cough.  SKIN:  He denies any rash or jaundice.  GI:  See HPI.   PHYSICAL EXAMINATION:  VITAL SIGNS:  Weight 234 pounds, height 68 inches.  Temperature 98 degrees, blood pressure 108/78, pulse 70.  GENERAL:  [name] is a 60 year old Caucasian male who appears older than  his stated age.  He is pale and stiff and ambulates with cane.  He is alert  and oriented, in no acute distress.  HEENT:  Sclerae clear, nonicteric.  Conjunctivae are pink. Neck supple  without thyromegaly or mass.  Oropharynx pink and moist without any lesions.  CHEST:  Heart regular rate and rhythm  without murmurs, clicks, rubs, or  gallops.  Lungs clear to auscultation bilaterally.  ABDOMEN:  Protuberant with positive bowel sounds in all four quadrants,  slightly distended.  No organomegaly.  Slight reproduction of pain to left  upper quadrant palpation.  EXTREMITIES:  Show 2+ pedal pulses bilaterally.  No pedal edema.  SKIN:  Pale, warm, and dry without any rash or jaundice.   ASSESSMENT:  [name] is a 60 year old Caucasian male with chronic  arthritis and joint pain currently being treated on methotrexate who  presents with normocytic anemia as well as heme positive stools x2.  At this  point in time he does have some history of GERD which appears to be well  controlled with Prilosec daily.  He denies any other abdominal complaints.  Given his family history of rectal carcinoma as well as his age of 59 years,  I feel it would be most beneficial to proceed with colonoscopy at this point  in time, especially given the fact that he is on methotrexate.  This may  need to be followed by EGD if a lower GI tract source is not found, per Dr.  Patty [name] recommendations.  I would also like to obtain an anemia profile as  I do not see that this has been done as of yet.  Will also recheck an H&H  today.  He should continue his PPI prior to the procedure.  There is a  possibility that source of the normocytic anemia could be related to  methotrexate use as it is known to cause GI bleeding.  However, it can also  cause anemia as well.   RECOMMENDATIONS:  1. Colonoscopy plus or minus EGD to be scheduled with Dr. Karilyn [name] as soon as     possible.  Will obtain an H&H today and if this has dropped since     previous labs will schedule colonoscopy and EGD sooner.  2. Will obtain  anemia panel to include serum iron, TIBC, and ferratin today.  3. He is to continue Prilosec daily.  4.     He is instructed to call our office or go immediately to the emergency room     if he has any chest pain,  shortness of breath, palpitations, or     increasing weakness.   We would like to thank Dr. Sudie [name] for this kind referral.     _____________________________________  ___________________________________________  Nicholas Lose, N.P.               [name], M.D.   KC/MEDQ  D:  09/12/2003  T:  09/12/2003  Job:  [id number]   cc:   Mila Homer. [name], M.D.  [address] [place], [place] [address]  Fax: [phone]

## 2011-04-24 NOTE — Consult Note (Signed)
NAMEALONSO, GAPINSKI                           ACCOUNT NO.:  [id number]   MEDICAL RECORD NO.:  [id number]                   PATIENT TYPE:   LOCATION:  [facility]                                  FACILITY:  [facility]   PHYSICIAN:  Sondra Come, D.O.                 DATE OF BIRTH:  03/18/1951   DATE OF CONSULTATION:  09/25/2002  DATE OF DISCHARGE:                                   CONSULTATION   REASON FOR CONSULTATION:  The patient returns to clinic today as scheduled  for reevaluation.  Last clinic visit was August 28, 2002.  In the  interim, the patient has had some blood work drawn which revealed a normal  CBC with differential, normal rheumatoid factor, negative antinuclear  antibody and a slightly elevated erythrocyte sedimentation rate at 26.  He  seems to be doing well overall today with a pain score of 3/10 on a  subjective scale.  He continues on the Duragesic 75 mcg patches which have  been very helpful in controlling his pain; he also continues to take  oxycodone 5 mg either two pills three times per day or three pills two times  per day for breakthrough pain and we discussed this.  The patient continues  to express his desire to eventually decrease the amount of narcotic pain  medication that he is currently taking and we discussed this at length.  His  function and quality-of-life indices remain stable and have improved overall  but still remains somewhat declined from normal.  His sleep is improved as  well.  He notes occasional jerking in his right foot and head intermittently  when he is fatigued.  I question whether or not Neurontin has any effect on  this.  He continues with his home exercise program and is compliant with  medications.  I review health and history form and 14-point review of  systems.  The patient denies any further incontinent episodes.  I count his  oxycodone pills and he has twelve 5 mg oxycodone pills remaining, which is  appropriate.   PHYSICAL  EXAMINATION:  GENERAL:  Physical exam reveals a healthy male in no  acute distress.  VITAL SIGNS:  Blood pressure is 142/66, pulse 89, respirations 20, O2  saturation 97% on room air.  NEUROMUSCULAR:  No new neurological findings on the lower extremities  including motor, sensory and reflexes.  There are no upper motor neuron  signs noted at this time.   IMPRESSION:  1. Post-laminectomy syndrome.  2. Chronic low back pain.  3. Degenerative disk disease of the lumbar spine, cervical spine and     thoracic spine.  4. Lumbar facet arthropathy.  5. Cervical spinal stenosis.   PLAN:  1. I continue to discuss further treatment options and consideration of     other non-narcotic alternatives.  At this point, we will continue with     the  Duragesic 75 mcg patch q.48h., #15 without refills, with oxycodone     for breakthrough pain.  I discussed with him slowly decreasing his     breakthrough pain medication usage in an attempt to decrease his narcotic     exposure.  At this point, we will keep him on 5 mg one to two p.o. t.i.d.     as needed for breakthrough pain.  2. We will try to obtain a Galvanic neuromuscular stimulator through [facility] to assist with pain control and hopefully enable him to decrease     his narcotic exposure.  3. Recommend a Tempur-Pedic mattress.  4.     Continue home exercise program.  5. Patient to return to clinic in one month for reevaluation.   Patient was educated in the above findings and recommendations and  understands.  No barriers to communication.                                               Sondra Come, D.O.    JJW/MEDQ  D:  09/25/2002  T:  09/26/2002  Job:  [id number]   cc:   Danae Orleans. Venetia Maxon, M.D.  [address]  [place]  [place] [address]  Fax: [phone]

## 2011-04-28 ENCOUNTER — Other Ambulatory Visit (HOSPITAL_COMMUNITY): Payer: Self-pay | Admitting: Family Medicine

## 2011-04-28 DIAGNOSIS — L539 Erythematous condition, unspecified: Secondary | ICD-10-CM

## 2011-04-28 DIAGNOSIS — N644 Mastodynia: Secondary | ICD-10-CM

## 2011-04-28 DIAGNOSIS — N63 Unspecified lump in unspecified breast: Secondary | ICD-10-CM

## 2011-04-28 DIAGNOSIS — R52 Pain, unspecified: Secondary | ICD-10-CM

## 2011-05-13 ENCOUNTER — Ambulatory Visit (HOSPITAL_COMMUNITY)
Admission: RE | Admit: 2011-05-13 | Discharge: 2011-05-13 | Disposition: A | Discharge status: Home / Self Care | Payer: 59 | Source: Ambulatory Visit | Attending: Family Medicine | Admitting: Family Medicine

## 2011-05-13 DIAGNOSIS — N63 Unspecified lump in unspecified breast: Secondary | ICD-10-CM

## 2011-05-13 DIAGNOSIS — N644 Mastodynia: Secondary | ICD-10-CM | POA: Insufficient documentation

## 2011-05-13 DIAGNOSIS — R52 Pain, unspecified: Secondary | ICD-10-CM

## 2011-05-13 DIAGNOSIS — N62 Hypertrophy of breast: Secondary | ICD-10-CM | POA: Insufficient documentation

## 2011-05-13 DIAGNOSIS — L539 Erythematous condition, unspecified: Secondary | ICD-10-CM

## 2011-09-21 LAB — DIFFERENTIAL
Basophils Absolute: 0
Basophils Relative: 0
Lymphocytes Relative: 6 — ABNORMAL LOW
Lymphs Abs: 3.1
Monocytes Absolute: 0.6
Monocytes Relative: 8
Neutro Abs: 14.4 — ABNORMAL HIGH
Neutro Abs: 4
Neutrophils Relative %: 49
Neutrophils Relative %: 90 — ABNORMAL HIGH

## 2011-09-21 LAB — URINALYSIS, ROUTINE W REFLEX MICROSCOPIC
Ketones, ur: NEGATIVE
Nitrite: NEGATIVE
Protein, ur: NEGATIVE
Specific Gravity, Urine: 1.015
pH: 8.5 — ABNORMAL HIGH

## 2011-09-21 LAB — BASIC METABOLIC PANEL
CO2: 28
Calcium: 9.1
Chloride: 107
GFR calc Af Amer: >60
GFR calc non Af Amer: >60
Glucose, Bld: 144 — ABNORMAL HIGH
Glucose, Bld: 91
Potassium: 3.7
Potassium: 4.4
Sodium: 137

## 2011-09-21 LAB — CBC
HCT: 40.6
Hemoglobin: 13.7
MCHC: 33.5
Platelets: 232
RBC: 5.16
RDW: 12.6
RDW: 13
WBC: 16 — ABNORMAL HIGH
WBC: 8

## 2011-09-21 LAB — CULTURE, BLOOD (ROUTINE X 2)
Culture: NO GROWTH
Report Status: 8092008

## 2011-09-21 LAB — URINE MICROSCOPIC-ADD ON

## 2011-09-21 LAB — BLOOD GAS, ARTERIAL
FIO2: 0.21
O2 Saturation: 93.8
pH, Arterial: 7.402
pO2, Arterial: 67.8 — ABNORMAL LOW

## 2011-09-21 LAB — URINE CULTURE: Colony Count: NO GROWTH

## 2011-11-18 ENCOUNTER — Encounter (HOSPITAL_COMMUNITY): Payer: Self-pay | Admitting: Pharmacy Technician

## 2011-11-24 ENCOUNTER — Encounter (HOSPITAL_COMMUNITY): Payer: Self-pay

## 2011-11-24 ENCOUNTER — Other Ambulatory Visit: Payer: Self-pay

## 2011-11-24 ENCOUNTER — Encounter (HOSPITAL_COMMUNITY)
Admission: RE | Admit: 2011-11-24 | Discharge: 2011-11-24 | Disposition: A | Discharge status: Home / Self Care | Payer: Medicare Other | Source: Ambulatory Visit | Attending: Ophthalmology | Admitting: Ophthalmology

## 2011-11-24 HISTORY — DX: Gastro-esophageal reflux disease without esophagitis: K21.9

## 2011-11-24 HISTORY — DX: Reserved for inherently not codable concepts without codable children: IMO0001

## 2011-11-24 HISTORY — DX: Unspecified hearing loss, unspecified ear: H91.90

## 2011-11-24 HISTORY — DX: Other chronic pain: G89.29

## 2011-11-24 HISTORY — DX: Ankylosing hyperostosis (forestier), site unspecified: M48.10

## 2011-11-24 LAB — BASIC METABOLIC PANEL
BUN: 7 mg/dL (ref 6–23)
CO2: 32 mEq/L (ref 19–32)
GFR calc non Af Amer: >90 mL/min (ref >90)
Glucose, Bld: 106 mg/dL — ABNORMAL HIGH (ref 70–99)
Potassium: 3.2 mEq/L — ABNORMAL LOW (ref 3.5–5.1)

## 2011-11-24 LAB — CBC
HCT: 44 % (ref 39.0–52.0)
Hemoglobin: 14.7 g/dL (ref 13.0–17.0)
MCH: 29.2 pg (ref 26.0–34.0)
MCHC: 33.4 g/dL (ref 30.0–36.0)
RBC: 5.03 MIL/uL (ref 4.22–5.81)

## 2011-11-24 NOTE — Patient Instructions (Addendum)
[address] Asher  11/24/2011   Your procedure is scheduled on:  12/03/2011  Report to [facility] at 11:00 AM.  Call this number if you have problems the morning of surgery: [phone]   Remember:   Do not eat food:After Midnight.    Clear liquids include soda, tea, black coffee, apple or grape juice, broth.  Take these medicines the morning of surgery with A SIP OF WATER: Prevacid, Xanax, Cymbalta   Do not wear jewelry, make-up or nail polish.  Do not wear lotions, powders, or perfumes. You may wear deodorant.  Do not shave 48 hours prior to surgery.  Do not bring valuables to the hospital.  Contacts, dentures or bridgework may not be worn into surgery.  Leave suitcase in the car. After surgery it may be brought to your room.  For patients admitted to the hospital, checkout time is 11:00 AM the day of discharge.   Patients discharged the day of surgery will not be allowed to drive home.  Name and phone number of your driver:  Special Instructions: N/A   Please read over the following fact sheets that you were given: Pain Booklet, MRSA Information, Anesthesia Post-op Instructions and Care and Recovery After Surgery

## 2011-11-25 NOTE — Progress Notes (Signed)
Notified Dr. Jayme Cloud of hypokalemia. No new order received.

## 2011-12-03 ENCOUNTER — Ambulatory Visit (HOSPITAL_COMMUNITY): Payer: Medicare Other | Admitting: Anesthesiology

## 2011-12-03 ENCOUNTER — Encounter (HOSPITAL_COMMUNITY): Payer: Self-pay | Admitting: Anesthesiology

## 2011-12-03 ENCOUNTER — Encounter (HOSPITAL_COMMUNITY)
Admission: RE | Disposition: A | Discharge status: Home / Self Care | Payer: Self-pay | Source: Ambulatory Visit | Attending: Ophthalmology

## 2011-12-03 ENCOUNTER — Encounter (HOSPITAL_COMMUNITY): Payer: Self-pay | Admitting: *Deleted

## 2011-12-03 ENCOUNTER — Ambulatory Visit (HOSPITAL_COMMUNITY)
Admission: RE | Admit: 2011-12-03 | Discharge: 2011-12-03 | Disposition: A | Discharge status: Home / Self Care | Payer: Medicare Other | Source: Ambulatory Visit | Attending: Ophthalmology | Admitting: Ophthalmology

## 2011-12-03 DIAGNOSIS — Z79899 Other long term (current) drug therapy: Secondary | ICD-10-CM | POA: Insufficient documentation

## 2011-12-03 DIAGNOSIS — H2589 Other age-related cataract: Secondary | ICD-10-CM | POA: Insufficient documentation

## 2011-12-03 DIAGNOSIS — Z01810 Encounter for preprocedural cardiovascular examination: Secondary | ICD-10-CM | POA: Insufficient documentation

## 2011-12-03 DIAGNOSIS — Z01812 Encounter for preprocedural laboratory examination: Secondary | ICD-10-CM | POA: Insufficient documentation

## 2011-12-03 HISTORY — PX: CATARACT EXTRACTION W/PHACO: SHX586

## 2011-12-03 SURGERY — PHACOEMULSIFICATION, CATARACT, WITH IOL INSERTION
Anesthesia: Monitor Anesthesia Care | Site: Eye | Laterality: Right | Wound class: Clean

## 2011-12-03 MED ORDER — PHENYLEPHRINE HCL 2.5 % OP SOLN
OPHTHALMIC | Status: AC
  Administered 2011-12-03: 1 [drp] via OPHTHALMIC
  Filled 2011-12-03: qty 2

## 2011-12-03 MED ORDER — EPINEPHRINE HCL 1 MG/ML IJ SOLN
INTRAOCULAR | Status: DC | PRN
  Administered 2011-12-03: 12:00:00

## 2011-12-03 MED ORDER — POVIDONE-IODINE 5 % OP SOLN
OPHTHALMIC | Status: DC | PRN
  Administered 2011-12-03: 1 via OPHTHALMIC

## 2011-12-03 MED ORDER — LIDOCAINE HCL (PF) 1 % IJ SOLN
INTRAMUSCULAR | Status: DC | PRN
  Administered 2011-12-03: .5 mL

## 2011-12-03 MED ORDER — FENTANYL CITRATE 0.05 MG/ML IJ SOLN
INTRAMUSCULAR | Status: DC | PRN
  Administered 2011-12-03: 100 ug via INTRAVENOUS

## 2011-12-03 MED ORDER — NEOMYCIN-POLYMYXIN-DEXAMETH 3.5-10000-0.1 OP OINT
TOPICAL_OINTMENT | OPHTHALMIC | Status: AC
  Filled 2011-12-03: qty 3.5

## 2011-12-03 MED ORDER — PHENYLEPHRINE HCL 2.5 % OP SOLN: 1.0000 [drp] | OPHTHALMIC | Status: AC

## 2011-12-03 MED ORDER — TETRACAINE HCL 0.5 % OP SOLN: 1.0000 [drp] | OPHTHALMIC | Status: AC

## 2011-12-03 MED ORDER — LIDOCAINE HCL (PF) 1 % IJ SOLN
INTRAMUSCULAR | Status: AC
  Filled 2011-12-03: qty 2

## 2011-12-03 MED ORDER — CYCLOPENTOLATE-PHENYLEPHRINE 0.2-1 % OP SOLN
OPHTHALMIC | Status: AC
  Administered 2011-12-03: 1 [drp] via OPHTHALMIC
  Filled 2011-12-03: qty 2

## 2011-12-03 MED ORDER — LACTATED RINGERS IV SOLN
INTRAVENOUS | Status: DC | PRN
  Administered 2011-12-03: 11:00:00 via INTRAVENOUS

## 2011-12-03 MED ORDER — CYCLOPENTOLATE-PHENYLEPHRINE 0.2-1 % OP SOLN: 1.0000 [drp] | OPHTHALMIC | Status: AC

## 2011-12-03 MED ORDER — CYCLOPENTOLATE-PHENYLEPHRINE 0.2-1 % OP SOLN
1.0000 [drp] | OPHTHALMIC | Status: AC
  Administered 2011-12-03 (×3): 1 [drp] via OPHTHALMIC

## 2011-12-03 MED ORDER — FENTANYL CITRATE 0.05 MG/ML IJ SOLN: 25.0000 ug | INTRAMUSCULAR | Status: DC | PRN

## 2011-12-03 MED ORDER — LACTATED RINGERS IV SOLN
INTRAVENOUS | Status: DC
  Administered 2011-12-03: 1000 mL via INTRAVENOUS

## 2011-12-03 MED ORDER — FENTANYL CITRATE 0.05 MG/ML IJ SOLN
INTRAMUSCULAR | Status: AC
  Filled 2011-12-03: qty 2

## 2011-12-03 MED ORDER — ONDANSETRON HCL 4 MG/2ML IJ SOLN: 4.0000 mg | Freq: Once | INTRAMUSCULAR | Status: DC | PRN

## 2011-12-03 MED ORDER — LIDOCAINE HCL 3.5 % OP GEL
1.0000 "application " | Freq: Once | OPHTHALMIC | Status: AC
  Administered 2011-12-03: 1 via OPHTHALMIC

## 2011-12-03 MED ORDER — LIDOCAINE HCL 3.5 % OP GEL: 1.0000 "application " | Freq: Once | OPHTHALMIC | Status: DC

## 2011-12-03 MED ORDER — LIDOCAINE HCL 3.5 % OP GEL
OPHTHALMIC | Status: AC
  Filled 2011-12-03: qty 5

## 2011-12-03 MED ORDER — TETRACAINE HCL 0.5 % OP SOLN
1.0000 [drp] | OPHTHALMIC | Status: AC
  Administered 2011-12-03 (×3): 1 [drp] via OPHTHALMIC

## 2011-12-03 MED ORDER — LIDOCAINE 3.5 % OP GEL OPTIME - NO CHARGE
OPHTHALMIC | Status: DC | PRN
  Administered 2011-12-03: 1 [drp] via OPHTHALMIC

## 2011-12-03 MED ORDER — MIDAZOLAM HCL 2 MG/2ML IJ SOLN
1.0000 mg | INTRAMUSCULAR | Status: DC | PRN
  Administered 2011-12-03: 2 mg via INTRAVENOUS

## 2011-12-03 MED ORDER — NEOMYCIN-POLYMYXIN-DEXAMETH 0.1 % OP OINT
TOPICAL_OINTMENT | OPHTHALMIC | Status: DC | PRN
  Administered 2011-12-03: 1 via OPHTHALMIC

## 2011-12-03 MED ORDER — PROVISC 10 MG/ML IO SOLN
INTRAOCULAR | Status: DC | PRN
  Administered 2011-12-03: 8.5 mg via INTRAOCULAR

## 2011-12-03 MED ORDER — TETRACAINE HCL 0.5 % OP SOLN
OPHTHALMIC | Status: AC
  Administered 2011-12-03: 1 [drp] via OPHTHALMIC
  Filled 2011-12-03: qty 2

## 2011-12-03 MED ORDER — MIDAZOLAM HCL 2 MG/2ML IJ SOLN
INTRAMUSCULAR | Status: AC
  Filled 2011-12-03: qty 2

## 2011-12-03 MED ORDER — BSS IO SOLN
INTRAOCULAR | Status: DC | PRN
  Administered 2011-12-03: 15 mL via INTRAOCULAR

## 2011-12-03 MED ORDER — PHENYLEPHRINE HCL 2.5 % OP SOLN
1.0000 [drp] | OPHTHALMIC | Status: AC
  Administered 2011-12-03 (×3): 1 [drp] via OPHTHALMIC

## 2011-12-03 SURGICAL SUPPLY — 31 items
CAPSULAR TENSION RING-AMO (OPHTHALMIC RELATED) IMPLANT
CLOTH BEACON ORANGE TIMEOUT ST (SAFETY) ×1 IMPLANT
EYE SHIELD UNIVERSAL CLEAR (GAUZE/BANDAGES/DRESSINGS) ×1 IMPLANT
GLOVE BIO SURGEON STRL SZ 6.5 (GLOVE) IMPLANT
GLOVE BIOGEL PI IND STRL 6.5 (GLOVE) IMPLANT
GLOVE BIOGEL PI IND STRL 7.0 (GLOVE) IMPLANT
GLOVE BIOGEL PI IND STRL 7.5 (GLOVE) IMPLANT
GLOVE BIOGEL PI INDICATOR 6.5 (GLOVE)
GLOVE BIOGEL PI INDICATOR 7.0 (GLOVE) ×1
GLOVE BIOGEL PI INDICATOR 7.5 (GLOVE)
GLOVE ECLIPSE 6.5 STRL STRAW (GLOVE) IMPLANT
GLOVE ECLIPSE 7.0 STRL STRAW (GLOVE) IMPLANT
GLOVE ECLIPSE 7.5 STRL STRAW (GLOVE) IMPLANT
GLOVE EXAM NITRILE LRG STRL (GLOVE) IMPLANT
GLOVE EXAM NITRILE MD LF STRL (GLOVE) ×1 IMPLANT
GLOVE SKINSENSE NS SZ6.5 (GLOVE)
GLOVE SKINSENSE NS SZ7.0 (GLOVE)
GLOVE SKINSENSE STRL SZ6.5 (GLOVE) IMPLANT
GLOVE SKINSENSE STRL SZ7.0 (GLOVE) IMPLANT
KIT VITRECTOMY (OPHTHALMIC RELATED) IMPLANT
PAD ARMBOARD 7.5X6 YLW CONV (MISCELLANEOUS) ×1 IMPLANT
PROC W NO LENS (INTRAOCULAR LENS)
PROC W SPEC LENS (INTRAOCULAR LENS)
PROCESS W NO LENS (INTRAOCULAR LENS) IMPLANT
PROCESS W SPEC LENS (INTRAOCULAR LENS) IMPLANT
RING MALYGIN (MISCELLANEOUS) IMPLANT
SIGHTPATH CAT PROC W REG LENS (Ophthalmic Related) ×2 IMPLANT
SYR TB 1ML LL NO SAFETY (SYRINGE) ×1 IMPLANT
TAPE CLOTH 1X10 TAN NS (GAUZE/BANDAGES/DRESSINGS) ×1 IMPLANT
VISCOELASTIC ADDITIONAL (OPHTHALMIC RELATED) IMPLANT
WATER STERILE IRR 250ML POUR (IV SOLUTION) ×1 IMPLANT

## 2011-12-03 NOTE — Brief Op Note (Signed)
Pre-Op Dx: Cataract OD Post-Op Dx: Cataract OD Surgeon: Gemma Payor Anesthesia: Topical with MAC Implant: Lenstec, Model B&L enVista Blood Loss: None Specimen: None Complications: None

## 2011-12-03 NOTE — Transfer of Care (Signed)
Immediate Anesthesia Transfer of Care Note  Patient: [name]  Procedure(s) Performed:  CATARACT EXTRACTION PHACO AND INTRAOCULAR LENS PLACEMENT (IOC) - CDE: 7.86  Patient Location: PACU  Anesthesia Type: MAC  Level of Consciousness: awake, alert  and oriented  Airway & Oxygen Therapy: Patient Spontanous Breathing and Patient connected to nasal cannula oxygen  Post-op Assessment: Report given to PACU RN and Post -op Vital signs reviewed and stable  Post vital signs: Reviewed and stable  Complications: No apparent anesthesia complications

## 2011-12-03 NOTE — Anesthesia Preprocedure Evaluation (Signed)
Anesthesia Evaluation  Patient identified by MRN, date of birth, ID band Patient awake    Reviewed: Allergy & Precautions, H&P , NPO status , Patient's Chart, lab work & pertinent test results  History of Anesthesia Complications Negative for: history of anesthetic complications  Airway Mallampati: II      Dental  (+) Edentulous Upper and Edentulous Lower   Pulmonary neg pulmonary ROS,  clear to auscultation        Cardiovascular neg cardio ROS Regular Normal    Neuro/Psych Anxiety Depression    GI/Hepatic GERD-  Medicated,  Endo/Other  Morbid obesity  Renal/GU      Musculoskeletal   Abdominal   Peds  Hematology   Anesthesia Other Findings   Reproductive/Obstetrics                           Anesthesia Physical Anesthesia Plan  ASA: III  Anesthesia Plan: MAC   Post-op Pain Management:    Induction: Intravenous  Airway Management Planned: Nasal Cannula  Additional Equipment:   Intra-op Plan:   Post-operative Plan:   Informed Consent: I have reviewed the patients History and Physical, chart, labs and discussed the procedure including the risks, benefits and alternatives for the proposed anesthesia with the patient or authorized representative who has indicated his/her understanding and acceptance.     Plan Discussed with:   Anesthesia Plan Comments:         Anesthesia Quick Evaluation

## 2011-12-03 NOTE — H&P (Signed)
I have reviewed the H&P, the patient was re-examined, and I have identified no interval changes in medical condition and plan of care since the history and physical of record  

## 2011-12-03 NOTE — Anesthesia Postprocedure Evaluation (Signed)
  Anesthesia Post-op Note  Patient: [name]  Procedure(s) Performed:  CATARACT EXTRACTION PHACO AND INTRAOCULAR LENS PLACEMENT (IOC) - CDE: 7.86  Patient Location:short stay  Anesthesia Type: MAC  Level of Consciousness: awake, alert , oriented and patient cooperative  Airway and Oxygen Therapy: Patient Spontanous Breathing  Post-op Pain: none  Post-op Assessment: Post-op Vital signs reviewed, Patient's Cardiovascular Status Stable, Respiratory Function Stable, Patent Airway and Pain level controlled  Post-op Vital Signs: Reviewed and stable  Complications: No apparent anesthesia complications

## 2011-12-04 NOTE — Op Note (Signed)
[name], [name]                 ACCOUNT NO.:  [id number]  MEDICAL RECORD NO.:  [id number]  LOCATION:  [facility]                          FACILITY:  [facility]  PHYSICIAN:  Susanne Greenhouse, MD       DATE OF BIRTH:  [birth year]  DATE OF PROCEDURE:  12/03/2011 DATE OF DISCHARGE:  12/03/2011                              OPERATIVE REPORT   PREOPERATIVE DIAGNOSIS:  Combined cataract, right eye, diagnosis code 366.19.  POSTOPERATIVE DIAGNOSIS:  Combined cataract, right eye, diagnosis code 366.19.  OPERATION PERFORMED:  Phacoemulsification with posterior chamber intraocular lens implantation, right eye.  SURGEON:  Bonne Dolores. Rainna Nearhood, MD.  ANESTHESIA:  General endotracheal anesthesia.  OPERATIVE SUMMARY:  In the preoperative area, dilating drops were placed into the right eye.  The patient was then brought into the operating room where she was placed under general anesthesia.  The eye was then prepped and draped.  Beginning with a 75 blade, a paracentesis port was made at the surgeon's 2 o'clock position.  The anterior chamber was then filled with a 1% nonpreserved lidocaine solution with epinephrine.  This was followed by Viscoat to deepen the chamber.  A small fornix-based peritomy was performed superiorly.  Next, a single iris hook was placed through the limbus superiorly.  A 2.4-mm keratome blade was then used to make a clear corneal incision over the iris hook.  A bent cystotome needle and Utrata forceps were used to create a continuous tear capsulotomy.  Hydrodissection was performed using balanced salt solution on a fine cannula.  The lens nucleus was then removed using phacoemulsification in a quadrant cracking technique.  The cortical material was then removed with irrigation and aspiration.  The capsular bag and anterior chamber were refilled with Provisc.  The wound was widened to approximately 3 mm and a posterior chamber intraocular lens was placed into the capsular bag without difficulty  using an [employer] lens injecting system.  A single 10-0 nylon suture was then used to close the incision as well as stromal hydration.  The Provisc was removed from the anterior chamber and capsular bag with irrigation and aspiration.  At this point, the wounds were tested for leak, which were negative.  The anterior chamber remained deep and stable.  The patient tolerated the procedure well.  There were no operative complications, and she awoke from general anesthesia without problem.  No surgical specimens.  Prosthetic device used is a [employer] enVista posterior chamber lens, model NX60, power of 13.5, serial number is [id number].          ______________________________ Susanne Greenhouse, MD     KEH/MEDQ  D:  12/03/2011  T:  12/04/2011  Job:  [id number]

## 2011-12-09 ENCOUNTER — Encounter (HOSPITAL_COMMUNITY): Payer: Self-pay | Admitting: Ophthalmology

## 2011-12-17 ENCOUNTER — Encounter (HOSPITAL_COMMUNITY)
Admission: RE | Admit: 2011-12-17 | Discharge: 2011-12-17 | Payer: Medicare Other | Source: Ambulatory Visit | Admitting: Ophthalmology

## 2011-12-17 ENCOUNTER — Encounter (HOSPITAL_COMMUNITY): Payer: Self-pay

## 2011-12-21 ENCOUNTER — Encounter (HOSPITAL_COMMUNITY): Payer: Self-pay | Admitting: Anesthesiology

## 2011-12-21 ENCOUNTER — Ambulatory Visit (HOSPITAL_COMMUNITY): Payer: Medicare Other | Admitting: Anesthesiology

## 2011-12-21 ENCOUNTER — Encounter (HOSPITAL_COMMUNITY): Payer: Self-pay | Admitting: *Deleted

## 2011-12-21 ENCOUNTER — Ambulatory Visit (HOSPITAL_COMMUNITY)
Admission: RE | Admit: 2011-12-21 | Discharge: 2011-12-21 | Disposition: A | Discharge status: Home / Self Care | Payer: Medicare Other | Source: Ambulatory Visit | Attending: Ophthalmology | Admitting: Ophthalmology

## 2011-12-21 ENCOUNTER — Encounter (HOSPITAL_COMMUNITY)
Admission: RE | Disposition: A | Discharge status: Home / Self Care | Payer: Self-pay | Source: Ambulatory Visit | Attending: Ophthalmology

## 2011-12-21 DIAGNOSIS — H2589 Other age-related cataract: Secondary | ICD-10-CM | POA: Insufficient documentation

## 2011-12-21 HISTORY — PX: CATARACT EXTRACTION W/PHACO: SHX586

## 2011-12-21 SURGERY — PHACOEMULSIFICATION, CATARACT, WITH IOL INSERTION
Anesthesia: Monitor Anesthesia Care | Site: Eye | Laterality: Left | Wound class: Clean

## 2011-12-21 MED ORDER — FENTANYL CITRATE 0.05 MG/ML IJ SOLN
INTRAMUSCULAR | Status: AC
  Administered 2011-12-21: 50 ug via INTRAVENOUS
  Filled 2011-12-21: qty 2

## 2011-12-21 MED ORDER — MIDAZOLAM HCL 2 MG/2ML IJ SOLN
1.0000 mg | INTRAMUSCULAR | Status: DC | PRN
  Administered 2011-12-21: 2 mg via INTRAVENOUS

## 2011-12-21 MED ORDER — LIDOCAINE HCL 3.5 % OP GEL
OPHTHALMIC | Status: AC
  Administered 2011-12-21: 1 via OPHTHALMIC
  Filled 2011-12-21: qty 5

## 2011-12-21 MED ORDER — LIDOCAINE HCL (PF) 1 % IJ SOLN
INTRAMUSCULAR | Status: DC | PRN
  Administered 2011-12-21: .3 mL

## 2011-12-21 MED ORDER — FENTANYL CITRATE 0.05 MG/ML IJ SOLN
INTRAMUSCULAR | Status: DC | PRN
  Administered 2011-12-21: 50 ug via INTRAVENOUS

## 2011-12-21 MED ORDER — NEOMYCIN-POLYMYXIN-DEXAMETH 0.1 % OP OINT
TOPICAL_OINTMENT | OPHTHALMIC | Status: DC | PRN
  Administered 2011-12-21: 1 via OPHTHALMIC

## 2011-12-21 MED ORDER — TETRACAINE HCL 0.5 % OP SOLN
OPHTHALMIC | Status: AC
  Administered 2011-12-21: 1 [drp] via OPHTHALMIC
  Filled 2011-12-21: qty 2

## 2011-12-21 MED ORDER — BSS IO SOLN
INTRAOCULAR | Status: DC | PRN
  Administered 2011-12-21: 15 mL via INTRAOCULAR

## 2011-12-21 MED ORDER — TETRACAINE HCL 0.5 % OP SOLN
1.0000 [drp] | OPHTHALMIC | Status: AC
Start: 2011-12-21 — End: 2011-12-21
  Administered 2011-12-21 (×3): 1 [drp] via OPHTHALMIC

## 2011-12-21 MED ORDER — MIDAZOLAM HCL 2 MG/2ML IJ SOLN
INTRAMUSCULAR | Status: AC
  Administered 2011-12-21: 2 mg via INTRAVENOUS
  Filled 2011-12-21: qty 2

## 2011-12-21 MED ORDER — LACTATED RINGERS IV SOLN
INTRAVENOUS | Status: DC
  Administered 2011-12-21: 1000 mL via INTRAVENOUS

## 2011-12-21 MED ORDER — EPINEPHRINE HCL 1 MG/ML IJ SOLN
INTRAOCULAR | Status: DC | PRN
  Administered 2011-12-21: 14:00:00

## 2011-12-21 MED ORDER — PHENYLEPHRINE HCL 2.5 % OP SOLN
1.0000 [drp] | OPHTHALMIC | Status: AC
  Administered 2011-12-21 (×3): 1 [drp] via OPHTHALMIC

## 2011-12-21 MED ORDER — LIDOCAINE 3.5 % OP GEL OPTIME - NO CHARGE
OPHTHALMIC | Status: DC | PRN
  Administered 2011-12-21: 1 [drp] via OPHTHALMIC

## 2011-12-21 MED ORDER — LIDOCAINE HCL (PF) 1 % IJ SOLN
INTRAMUSCULAR | Status: AC
  Filled 2011-12-21: qty 2

## 2011-12-21 MED ORDER — PROVISC 10 MG/ML IO SOLN
INTRAOCULAR | Status: DC | PRN
  Administered 2011-12-21: 8.5 mg via INTRAOCULAR

## 2011-12-21 MED ORDER — CYCLOPENTOLATE-PHENYLEPHRINE 0.2-1 % OP SOLN
OPHTHALMIC | Status: AC
  Administered 2011-12-21: 1 [drp] via OPHTHALMIC
  Filled 2011-12-21: qty 2

## 2011-12-21 MED ORDER — LIDOCAINE HCL 3.5 % OP GEL
1.0000 "application " | Freq: Once | OPHTHALMIC | Status: AC
  Administered 2011-12-21: 1 via OPHTHALMIC

## 2011-12-21 MED ORDER — EPINEPHRINE HCL 1 MG/ML IJ SOLN
INTRAMUSCULAR | Status: AC
  Filled 2011-12-21: qty 1

## 2011-12-21 MED ORDER — POVIDONE-IODINE 5 % OP SOLN
OPHTHALMIC | Status: DC | PRN
  Administered 2011-12-21: 1 via OPHTHALMIC

## 2011-12-21 MED ORDER — CYCLOPENTOLATE-PHENYLEPHRINE 0.2-1 % OP SOLN
1.0000 [drp] | OPHTHALMIC | Status: AC
  Administered 2011-12-21 (×3): 1 [drp] via OPHTHALMIC

## 2011-12-21 MED ORDER — NEOMYCIN-POLYMYXIN-DEXAMETH 3.5-10000-0.1 OP OINT
TOPICAL_OINTMENT | OPHTHALMIC | Status: AC
  Filled 2011-12-21: qty 3.5

## 2011-12-21 MED ORDER — PHENYLEPHRINE HCL 2.5 % OP SOLN
OPHTHALMIC | Status: AC
  Administered 2011-12-21: 1 [drp] via OPHTHALMIC
  Filled 2011-12-21: qty 2

## 2011-12-21 MED ORDER — FENTANYL CITRATE 0.05 MG/ML IJ SOLN
25.0000 ug | INTRAMUSCULAR | Status: DC | PRN
  Administered 2011-12-21: 50 ug via INTRAVENOUS

## 2011-12-21 SURGICAL SUPPLY — 32 items
CAPSULAR TENSION RING-AMO (OPHTHALMIC RELATED) IMPLANT
CLOTH BEACON ORANGE TIMEOUT ST (SAFETY) ×1 IMPLANT
EYE SHIELD UNIVERSAL CLEAR (GAUZE/BANDAGES/DRESSINGS) ×1 IMPLANT
GLOVE BIO SURGEON STRL SZ 6.5 (GLOVE) IMPLANT
GLOVE BIOGEL PI IND STRL 6.5 (GLOVE) IMPLANT
GLOVE BIOGEL PI IND STRL 7.0 (GLOVE) IMPLANT
GLOVE BIOGEL PI IND STRL 7.5 (GLOVE) IMPLANT
GLOVE BIOGEL PI INDICATOR 6.5 (GLOVE) ×1
GLOVE BIOGEL PI INDICATOR 7.0 (GLOVE)
GLOVE BIOGEL PI INDICATOR 7.5 (GLOVE)
GLOVE ECLIPSE 6.5 STRL STRAW (GLOVE) IMPLANT
GLOVE ECLIPSE 7.0 STRL STRAW (GLOVE) IMPLANT
GLOVE ECLIPSE 7.5 STRL STRAW (GLOVE) IMPLANT
GLOVE EXAM NITRILE LRG STRL (GLOVE) IMPLANT
GLOVE EXAM NITRILE MD LF STRL (GLOVE) ×1 IMPLANT
GLOVE SKINSENSE NS SZ6.5 (GLOVE)
GLOVE SKINSENSE NS SZ7.0 (GLOVE)
GLOVE SKINSENSE STRL SZ6.5 (GLOVE) IMPLANT
GLOVE SKINSENSE STRL SZ7.0 (GLOVE) IMPLANT
KIT VITRECTOMY (OPHTHALMIC RELATED) IMPLANT
PAD ARMBOARD 7.5X6 YLW CONV (MISCELLANEOUS) ×2 IMPLANT
PROC W NO LENS (INTRAOCULAR LENS)
PROC W SPEC LENS (INTRAOCULAR LENS)
PROCESS W NO LENS (INTRAOCULAR LENS) IMPLANT
PROCESS W SPEC LENS (INTRAOCULAR LENS) IMPLANT
RING MALYGIN (MISCELLANEOUS) IMPLANT
SIGHTPATH CAT PROC W REG LENS (Ophthalmic Related) ×2 IMPLANT
SYR TB 1ML LL NO SAFETY (SYRINGE) ×1 IMPLANT
TAPE SURG TRANSPORE 1 IN (GAUZE/BANDAGES/DRESSINGS) IMPLANT
TAPE SURGICAL TRANSPORE 1 IN (GAUZE/BANDAGES/DRESSINGS) ×1
VISCOELASTIC ADDITIONAL (OPHTHALMIC RELATED) IMPLANT
WATER STERILE IRR 250ML POUR (IV SOLUTION) ×1 IMPLANT

## 2011-12-21 NOTE — H&P (Signed)
I have reviewed the H&P, the patient was re-examined, and I have identified no interval changes in medical condition and plan of care since the history and physical of record  

## 2011-12-21 NOTE — Anesthesia Preprocedure Evaluation (Signed)
Anesthesia Evaluation  Patient identified by MRN, date of birth, ID band Patient awake    Reviewed: Allergy & Precautions, H&P , NPO status , Patient's Chart, lab work & pertinent test results  History of Anesthesia Complications Negative for: history of anesthetic complications  Airway Mallampati: II      Dental  (+) Edentulous Upper and Edentulous Lower   Pulmonary neg pulmonary ROS,  clear to auscultation        Cardiovascular neg cardio ROS Regular Normal    Neuro/Psych Anxiety Depression    GI/Hepatic GERD-  Medicated,  Endo/Other  Morbid obesity  Renal/GU      Musculoskeletal   Abdominal   Peds  Hematology   Anesthesia Other Findings   Reproductive/Obstetrics                           Anesthesia Physical Anesthesia Plan  ASA: II  Anesthesia Plan: MAC   Post-op Pain Management:    Induction: Intravenous  Airway Management Planned: Nasal Cannula  Additional Equipment:   Intra-op Plan:   Post-operative Plan:   Informed Consent: I have reviewed the patients History and Physical, chart, labs and discussed the procedure including the risks, benefits and alternatives for the proposed anesthesia with the patient or authorized representative who has indicated his/her understanding and acceptance.     Plan Discussed with:   Anesthesia Plan Comments:         Anesthesia Quick Evaluation

## 2011-12-21 NOTE — Anesthesia Postprocedure Evaluation (Signed)
  Anesthesia Post-op Note  Patient: [name]  Procedure(s) Performed:  CATARACT EXTRACTION PHACO AND INTRAOCULAR LENS PLACEMENT (IOC) - CDE=5.51  Patient Location: PACU and Short Stay  Anesthesia Type: MAC  Level of Consciousness: awake, alert , oriented and patient cooperative  Airway and Oxygen Therapy: Patient Spontanous Breathing  Post-op Pain: 5 /10, moderate  Post-op Assessment: Post-op Vital signs reviewed, Patient's Cardiovascular Status Stable, Respiratory Function Stable, Patent Airway and No signs of Nausea or vomiting  Post-op Vital Signs: Reviewed and stable  Complications: No apparent anesthesia complications

## 2011-12-21 NOTE — Brief Op Note (Signed)
Pre-Op Dx: Cataract OS Post-Op Dx: Cataract OS Surgeon: Niasha Devins Anesthesia: Topical with MAC Implant: B&L enVista Specimen: None Complications: None 

## 2011-12-21 NOTE — Transfer of Care (Signed)
Immediate Anesthesia Transfer of Care Note  Patient: [name]  Procedure(s) Performed:  CATARACT EXTRACTION PHACO AND INTRAOCULAR LENS PLACEMENT (IOC) - CDE=5.51  Patient Location: PACU and Short Stay  Anesthesia Type: MAC  Level of Consciousness: awake, alert , oriented and patient cooperative  Airway & Oxygen Therapy: Patient Spontanous Breathing  Post-op Assessment: Report given to PACU RN, Post -op Vital signs reviewed and stable and Patient moving all extremities  Post vital signs: Reviewed and stable Filed Vitals:   12/21/11 1330  BP: 137/72  Pulse:   Temp:   Resp: 37    Complications: No apparent anesthesia complications

## 2011-12-22 NOTE — Op Note (Signed)
[name], [name]                 ACCOUNT NO.:  [id number]  MEDICAL RECORD NO.:  [id number]  LOCATION:  [facility]                          FACILITY:  [facility]  PHYSICIAN:  Susanne Greenhouse, MD       DATE OF BIRTH:  08/19/1951  DATE OF PROCEDURE:  12/21/2011 DATE OF DISCHARGE:  12/21/2011                              OPERATIVE REPORT   PREOPERATIVE DIAGNOSIS:  Combined cataract, left eye, diagnosis code 366.19.  POSTOPERATIVE DIAGNOSIS:  Combined cataract, left eye, diagnosis code 366.19.  OPERATION PERFORMED:  Phacoemulsification with posterior chamber intraocular lens implantation, left eye.  SURGEON:  Bonne Dolores. Akire Rennert, MD  ANESTHESIA:  General endotracheal anesthesia.  OPERATIVE SUMMARY:  In the preoperative area, dilating drops were placed into the left eye.  The patient was then brought into the operating room where he was placed under general anesthesia.  The eye was then prepped and draped.  Beginning with a 75 blade, a paracentesis port was made at the surgeon's 2 o'clock position.  The anterior chamber was then filled with a 1% nonpreserved lidocaine solution with epinephrine.  This was followed by Viscoat to deepen the chamber.  A small fornix-based peritomy was performed superiorly.  Next, a single iris hook was placed through the limbus superiorly.  A 2.4-mm keratome blade was then used to make a clear corneal incision over the iris hook.  A bent cystotome needle and Utrata forceps were used to create a continuous tear capsulotomy.  Hydrodissection was performed using balanced salt solution on a fine cannula.  The lens nucleus was then removed using phacoemulsification in a quadrant cracking technique.  The cortical material was then removed with irrigation and aspiration.  The capsular bag and anterior chamber were refilled with Provisc.  The wound was widened to approximately 3 mm and a posterior chamber intraocular lens was placed into the capsular bag without difficulty using  an [employer] lens injecting system.  A single 10-0 nylon suture was then used to close the incision as well as stromal hydration.  The Provisc was removed from the anterior chamber and capsular bag with irrigation and aspiration.  At this point, the wounds were tested for leak, which were negative.  The anterior chamber remained deep and stable.  The patient tolerated the procedure well.  There were no operative complications, and he awoke from general anesthesia without problem.  No surgical specimens.  The patient's surgery was performed on a regular hospital stretcher due to the patient's weight being too heavy for a eye bed and this made positioning and comfort very difficult for the surgeon.  There were no complications.  No surgical specimens. Prosthetic device used is a [employer] posterior chamber lens, model MX60, power of 13.0, serial number is [id number].          ______________________________ Susanne Greenhouse, MD     KEH/MEDQ  D:  12/21/2011  T:  12/22/2011  Job:  [id number]

## 2011-12-23 ENCOUNTER — Encounter (HOSPITAL_COMMUNITY): Payer: Self-pay | Admitting: Ophthalmology

## 2012-02-16 ENCOUNTER — Other Ambulatory Visit (HOSPITAL_COMMUNITY): Payer: Self-pay | Admitting: Family Medicine

## 2012-02-16 ENCOUNTER — Ambulatory Visit (HOSPITAL_COMMUNITY)
Admission: RE | Admit: 2012-02-16 | Discharge: 2012-02-16 | Disposition: A | Discharge status: Home / Self Care | Payer: Medicare Other | Source: Ambulatory Visit | Attending: Family Medicine | Admitting: Family Medicine

## 2012-02-16 DIAGNOSIS — R0781 Pleurodynia: Secondary | ICD-10-CM

## 2012-02-16 DIAGNOSIS — R609 Edema, unspecified: Secondary | ICD-10-CM

## 2012-02-16 DIAGNOSIS — M79642 Pain in left hand: Secondary | ICD-10-CM

## 2012-02-16 DIAGNOSIS — R0789 Other chest pain: Secondary | ICD-10-CM

## 2012-02-16 DIAGNOSIS — M79609 Pain in unspecified limb: Secondary | ICD-10-CM | POA: Insufficient documentation

## 2012-02-16 DIAGNOSIS — R079 Chest pain, unspecified: Secondary | ICD-10-CM | POA: Insufficient documentation

## 2012-02-16 DIAGNOSIS — W19XXXA Unspecified fall, initial encounter: Secondary | ICD-10-CM | POA: Insufficient documentation

## 2012-02-16 DIAGNOSIS — IMO0002 Reserved for concepts with insufficient information to code with codable children: Secondary | ICD-10-CM | POA: Insufficient documentation

## 2013-01-05 ENCOUNTER — Encounter (INDEPENDENT_AMBULATORY_CARE_PROVIDER_SITE_OTHER): Payer: Self-pay | Admitting: *Deleted

## 2014-04-25 ENCOUNTER — Encounter (INDEPENDENT_AMBULATORY_CARE_PROVIDER_SITE_OTHER): Payer: Self-pay | Admitting: *Deleted

## 2016-12-09 DIAGNOSIS — L723 Sebaceous cyst: Secondary | ICD-10-CM | POA: Diagnosis not present

## 2016-12-09 DIAGNOSIS — Z79891 Long term (current) use of opiate analgesic: Secondary | ICD-10-CM | POA: Diagnosis not present

## 2016-12-09 DIAGNOSIS — R200 Anesthesia of skin: Secondary | ICD-10-CM | POA: Diagnosis not present

## 2016-12-09 DIAGNOSIS — M150 Primary generalized (osteo)arthritis: Secondary | ICD-10-CM | POA: Diagnosis not present

## 2016-12-11 DIAGNOSIS — L723 Sebaceous cyst: Secondary | ICD-10-CM | POA: Diagnosis not present

## 2017-01-20 DIAGNOSIS — R2681 Unsteadiness on feet: Secondary | ICD-10-CM | POA: Diagnosis not present

## 2017-02-10 DIAGNOSIS — M150 Primary generalized (osteo)arthritis: Secondary | ICD-10-CM | POA: Diagnosis not present

## 2017-02-10 DIAGNOSIS — G990 Autonomic neuropathy in diseases classified elsewhere: Secondary | ICD-10-CM | POA: Diagnosis not present

## 2017-02-10 DIAGNOSIS — Z79891 Long term (current) use of opiate analgesic: Secondary | ICD-10-CM | POA: Diagnosis not present

## 2017-02-10 DIAGNOSIS — G4700 Insomnia, unspecified: Secondary | ICD-10-CM | POA: Diagnosis not present

## 2017-02-22 DIAGNOSIS — I10 Essential (primary) hypertension: Secondary | ICD-10-CM | POA: Diagnosis not present

## 2017-02-22 DIAGNOSIS — G909 Disorder of the autonomic nervous system, unspecified: Secondary | ICD-10-CM | POA: Diagnosis not present

## 2017-02-22 DIAGNOSIS — G8929 Other chronic pain: Secondary | ICD-10-CM | POA: Diagnosis not present

## 2017-02-22 DIAGNOSIS — M1991 Primary osteoarthritis, unspecified site: Secondary | ICD-10-CM | POA: Diagnosis not present

## 2017-02-22 DIAGNOSIS — Z720 Tobacco use: Secondary | ICD-10-CM | POA: Diagnosis not present

## 2017-02-22 DIAGNOSIS — Z79891 Long term (current) use of opiate analgesic: Secondary | ICD-10-CM | POA: Diagnosis not present

## 2017-02-22 DIAGNOSIS — J449 Chronic obstructive pulmonary disease, unspecified: Secondary | ICD-10-CM | POA: Diagnosis not present

## 2017-02-22 DIAGNOSIS — R69 Illness, unspecified: Secondary | ICD-10-CM | POA: Diagnosis not present

## 2017-02-24 DIAGNOSIS — Z720 Tobacco use: Secondary | ICD-10-CM | POA: Diagnosis not present

## 2017-02-24 DIAGNOSIS — I10 Essential (primary) hypertension: Secondary | ICD-10-CM | POA: Diagnosis not present

## 2017-02-24 DIAGNOSIS — G8929 Other chronic pain: Secondary | ICD-10-CM | POA: Diagnosis not present

## 2017-02-24 DIAGNOSIS — R69 Illness, unspecified: Secondary | ICD-10-CM | POA: Diagnosis not present

## 2017-02-24 DIAGNOSIS — M1991 Primary osteoarthritis, unspecified site: Secondary | ICD-10-CM | POA: Diagnosis not present

## 2017-02-24 DIAGNOSIS — Z79891 Long term (current) use of opiate analgesic: Secondary | ICD-10-CM | POA: Diagnosis not present

## 2017-02-24 DIAGNOSIS — J449 Chronic obstructive pulmonary disease, unspecified: Secondary | ICD-10-CM | POA: Diagnosis not present

## 2017-02-24 DIAGNOSIS — G909 Disorder of the autonomic nervous system, unspecified: Secondary | ICD-10-CM | POA: Diagnosis not present

## 2017-02-25 DIAGNOSIS — J449 Chronic obstructive pulmonary disease, unspecified: Secondary | ICD-10-CM | POA: Diagnosis not present

## 2017-02-25 DIAGNOSIS — G8929 Other chronic pain: Secondary | ICD-10-CM | POA: Diagnosis not present

## 2017-02-25 DIAGNOSIS — Z720 Tobacco use: Secondary | ICD-10-CM | POA: Diagnosis not present

## 2017-02-25 DIAGNOSIS — Z79891 Long term (current) use of opiate analgesic: Secondary | ICD-10-CM | POA: Diagnosis not present

## 2017-02-25 DIAGNOSIS — I10 Essential (primary) hypertension: Secondary | ICD-10-CM | POA: Diagnosis not present

## 2017-02-25 DIAGNOSIS — G909 Disorder of the autonomic nervous system, unspecified: Secondary | ICD-10-CM | POA: Diagnosis not present

## 2017-02-25 DIAGNOSIS — R69 Illness, unspecified: Secondary | ICD-10-CM | POA: Diagnosis not present

## 2017-02-25 DIAGNOSIS — M1991 Primary osteoarthritis, unspecified site: Secondary | ICD-10-CM | POA: Diagnosis not present

## 2017-03-02 DIAGNOSIS — Z720 Tobacco use: Secondary | ICD-10-CM | POA: Diagnosis not present

## 2017-03-02 DIAGNOSIS — G909 Disorder of the autonomic nervous system, unspecified: Secondary | ICD-10-CM | POA: Diagnosis not present

## 2017-03-02 DIAGNOSIS — J449 Chronic obstructive pulmonary disease, unspecified: Secondary | ICD-10-CM | POA: Diagnosis not present

## 2017-03-02 DIAGNOSIS — M1991 Primary osteoarthritis, unspecified site: Secondary | ICD-10-CM | POA: Diagnosis not present

## 2017-03-02 DIAGNOSIS — I10 Essential (primary) hypertension: Secondary | ICD-10-CM | POA: Diagnosis not present

## 2017-03-02 DIAGNOSIS — R69 Illness, unspecified: Secondary | ICD-10-CM | POA: Diagnosis not present

## 2017-03-02 DIAGNOSIS — G8929 Other chronic pain: Secondary | ICD-10-CM | POA: Diagnosis not present

## 2017-03-02 DIAGNOSIS — Z79891 Long term (current) use of opiate analgesic: Secondary | ICD-10-CM | POA: Diagnosis not present

## 2017-03-04 DIAGNOSIS — Z79891 Long term (current) use of opiate analgesic: Secondary | ICD-10-CM | POA: Diagnosis not present

## 2017-03-04 DIAGNOSIS — J449 Chronic obstructive pulmonary disease, unspecified: Secondary | ICD-10-CM | POA: Diagnosis not present

## 2017-03-04 DIAGNOSIS — Z720 Tobacco use: Secondary | ICD-10-CM | POA: Diagnosis not present

## 2017-03-04 DIAGNOSIS — I10 Essential (primary) hypertension: Secondary | ICD-10-CM | POA: Diagnosis not present

## 2017-03-04 DIAGNOSIS — R69 Illness, unspecified: Secondary | ICD-10-CM | POA: Diagnosis not present

## 2017-03-04 DIAGNOSIS — G8929 Other chronic pain: Secondary | ICD-10-CM | POA: Diagnosis not present

## 2017-03-04 DIAGNOSIS — M1991 Primary osteoarthritis, unspecified site: Secondary | ICD-10-CM | POA: Diagnosis not present

## 2017-03-04 DIAGNOSIS — G909 Disorder of the autonomic nervous system, unspecified: Secondary | ICD-10-CM | POA: Diagnosis not present

## 2017-03-05 DIAGNOSIS — Z720 Tobacco use: Secondary | ICD-10-CM | POA: Diagnosis not present

## 2017-03-05 DIAGNOSIS — Z79891 Long term (current) use of opiate analgesic: Secondary | ICD-10-CM | POA: Diagnosis not present

## 2017-03-05 DIAGNOSIS — M1991 Primary osteoarthritis, unspecified site: Secondary | ICD-10-CM | POA: Diagnosis not present

## 2017-03-05 DIAGNOSIS — J449 Chronic obstructive pulmonary disease, unspecified: Secondary | ICD-10-CM | POA: Diagnosis not present

## 2017-03-05 DIAGNOSIS — R69 Illness, unspecified: Secondary | ICD-10-CM | POA: Diagnosis not present

## 2017-03-05 DIAGNOSIS — I10 Essential (primary) hypertension: Secondary | ICD-10-CM | POA: Diagnosis not present

## 2017-03-05 DIAGNOSIS — G909 Disorder of the autonomic nervous system, unspecified: Secondary | ICD-10-CM | POA: Diagnosis not present

## 2017-03-05 DIAGNOSIS — G8929 Other chronic pain: Secondary | ICD-10-CM | POA: Diagnosis not present

## 2017-03-08 DIAGNOSIS — Z79891 Long term (current) use of opiate analgesic: Secondary | ICD-10-CM | POA: Diagnosis not present

## 2017-03-08 DIAGNOSIS — J449 Chronic obstructive pulmonary disease, unspecified: Secondary | ICD-10-CM | POA: Diagnosis not present

## 2017-03-08 DIAGNOSIS — Z720 Tobacco use: Secondary | ICD-10-CM | POA: Diagnosis not present

## 2017-03-08 DIAGNOSIS — G8929 Other chronic pain: Secondary | ICD-10-CM | POA: Diagnosis not present

## 2017-03-08 DIAGNOSIS — M1991 Primary osteoarthritis, unspecified site: Secondary | ICD-10-CM | POA: Diagnosis not present

## 2017-03-08 DIAGNOSIS — R69 Illness, unspecified: Secondary | ICD-10-CM | POA: Diagnosis not present

## 2017-03-08 DIAGNOSIS — G909 Disorder of the autonomic nervous system, unspecified: Secondary | ICD-10-CM | POA: Diagnosis not present

## 2017-03-08 DIAGNOSIS — I10 Essential (primary) hypertension: Secondary | ICD-10-CM | POA: Diagnosis not present

## 2017-03-12 DIAGNOSIS — J449 Chronic obstructive pulmonary disease, unspecified: Secondary | ICD-10-CM | POA: Diagnosis not present

## 2017-03-12 DIAGNOSIS — G909 Disorder of the autonomic nervous system, unspecified: Secondary | ICD-10-CM | POA: Diagnosis not present

## 2017-03-12 DIAGNOSIS — I10 Essential (primary) hypertension: Secondary | ICD-10-CM | POA: Diagnosis not present

## 2017-03-12 DIAGNOSIS — Z79891 Long term (current) use of opiate analgesic: Secondary | ICD-10-CM | POA: Diagnosis not present

## 2017-03-12 DIAGNOSIS — M1991 Primary osteoarthritis, unspecified site: Secondary | ICD-10-CM | POA: Diagnosis not present

## 2017-03-12 DIAGNOSIS — G8929 Other chronic pain: Secondary | ICD-10-CM | POA: Diagnosis not present

## 2017-03-12 DIAGNOSIS — Z720 Tobacco use: Secondary | ICD-10-CM | POA: Diagnosis not present

## 2017-03-12 DIAGNOSIS — R69 Illness, unspecified: Secondary | ICD-10-CM | POA: Diagnosis not present

## 2017-03-23 DIAGNOSIS — I10 Essential (primary) hypertension: Secondary | ICD-10-CM | POA: Diagnosis not present

## 2017-03-23 DIAGNOSIS — G8929 Other chronic pain: Secondary | ICD-10-CM | POA: Diagnosis not present

## 2017-03-23 DIAGNOSIS — J449 Chronic obstructive pulmonary disease, unspecified: Secondary | ICD-10-CM | POA: Diagnosis not present

## 2017-03-23 DIAGNOSIS — M1991 Primary osteoarthritis, unspecified site: Secondary | ICD-10-CM | POA: Diagnosis not present

## 2017-03-23 DIAGNOSIS — Z720 Tobacco use: Secondary | ICD-10-CM | POA: Diagnosis not present

## 2017-03-23 DIAGNOSIS — G909 Disorder of the autonomic nervous system, unspecified: Secondary | ICD-10-CM | POA: Diagnosis not present

## 2017-03-23 DIAGNOSIS — Z79891 Long term (current) use of opiate analgesic: Secondary | ICD-10-CM | POA: Diagnosis not present

## 2017-03-23 DIAGNOSIS — R69 Illness, unspecified: Secondary | ICD-10-CM | POA: Diagnosis not present

## 2017-04-07 DIAGNOSIS — I10 Essential (primary) hypertension: Secondary | ICD-10-CM | POA: Diagnosis not present

## 2017-04-07 DIAGNOSIS — Z79891 Long term (current) use of opiate analgesic: Secondary | ICD-10-CM | POA: Diagnosis not present

## 2017-04-07 DIAGNOSIS — G8929 Other chronic pain: Secondary | ICD-10-CM | POA: Diagnosis not present

## 2017-04-07 DIAGNOSIS — R69 Illness, unspecified: Secondary | ICD-10-CM | POA: Diagnosis not present

## 2017-04-07 DIAGNOSIS — M1991 Primary osteoarthritis, unspecified site: Secondary | ICD-10-CM | POA: Diagnosis not present

## 2017-04-07 DIAGNOSIS — G909 Disorder of the autonomic nervous system, unspecified: Secondary | ICD-10-CM | POA: Diagnosis not present

## 2017-04-07 DIAGNOSIS — J449 Chronic obstructive pulmonary disease, unspecified: Secondary | ICD-10-CM | POA: Diagnosis not present

## 2017-04-07 DIAGNOSIS — Z720 Tobacco use: Secondary | ICD-10-CM | POA: Diagnosis not present

## 2017-04-22 DIAGNOSIS — G909 Disorder of the autonomic nervous system, unspecified: Secondary | ICD-10-CM | POA: Diagnosis not present

## 2017-04-22 DIAGNOSIS — M1991 Primary osteoarthritis, unspecified site: Secondary | ICD-10-CM | POA: Diagnosis not present

## 2017-04-22 DIAGNOSIS — R69 Illness, unspecified: Secondary | ICD-10-CM | POA: Diagnosis not present

## 2017-04-22 DIAGNOSIS — Z79891 Long term (current) use of opiate analgesic: Secondary | ICD-10-CM | POA: Diagnosis not present

## 2017-04-22 DIAGNOSIS — J449 Chronic obstructive pulmonary disease, unspecified: Secondary | ICD-10-CM | POA: Diagnosis not present

## 2017-04-22 DIAGNOSIS — G8929 Other chronic pain: Secondary | ICD-10-CM | POA: Diagnosis not present

## 2017-04-22 DIAGNOSIS — Z720 Tobacco use: Secondary | ICD-10-CM | POA: Diagnosis not present

## 2017-04-22 DIAGNOSIS — I10 Essential (primary) hypertension: Secondary | ICD-10-CM | POA: Diagnosis not present

## 2017-05-05 DIAGNOSIS — L219 Seborrheic dermatitis, unspecified: Secondary | ICD-10-CM | POA: Diagnosis not present

## 2017-05-05 DIAGNOSIS — L723 Sebaceous cyst: Secondary | ICD-10-CM | POA: Diagnosis not present

## 2017-06-15 DIAGNOSIS — R69 Illness, unspecified: Secondary | ICD-10-CM | POA: Diagnosis not present

## 2017-06-15 DIAGNOSIS — L723 Sebaceous cyst: Secondary | ICD-10-CM | POA: Diagnosis not present

## 2017-06-15 DIAGNOSIS — Z79891 Long term (current) use of opiate analgesic: Secondary | ICD-10-CM | POA: Diagnosis not present

## 2017-06-15 DIAGNOSIS — M150 Primary generalized (osteo)arthritis: Secondary | ICD-10-CM | POA: Diagnosis not present

## 2017-07-14 DIAGNOSIS — R69 Illness, unspecified: Secondary | ICD-10-CM | POA: Diagnosis not present

## 2017-07-14 DIAGNOSIS — G8929 Other chronic pain: Secondary | ICD-10-CM | POA: Diagnosis not present

## 2017-07-14 DIAGNOSIS — G4700 Insomnia, unspecified: Secondary | ICD-10-CM | POA: Diagnosis not present

## 2017-07-14 DIAGNOSIS — M150 Primary generalized (osteo)arthritis: Secondary | ICD-10-CM | POA: Diagnosis not present

## 2017-07-14 DIAGNOSIS — Z125 Encounter for screening for malignant neoplasm of prostate: Secondary | ICD-10-CM | POA: Diagnosis not present

## 2017-07-14 DIAGNOSIS — Z79891 Long term (current) use of opiate analgesic: Secondary | ICD-10-CM | POA: Diagnosis not present

## 2017-07-14 DIAGNOSIS — G990 Autonomic neuropathy in diseases classified elsewhere: Secondary | ICD-10-CM | POA: Diagnosis not present

## 2017-09-19 DIAGNOSIS — Z79899 Other long term (current) drug therapy: Secondary | ICD-10-CM | POA: Diagnosis not present

## 2017-09-19 DIAGNOSIS — S8001XA Contusion of right knee, initial encounter: Secondary | ICD-10-CM | POA: Diagnosis not present

## 2017-10-06 DIAGNOSIS — I10 Essential (primary) hypertension: Secondary | ICD-10-CM | POA: Diagnosis not present

## 2017-10-06 DIAGNOSIS — G990 Autonomic neuropathy in diseases classified elsewhere: Secondary | ICD-10-CM | POA: Diagnosis not present

## 2017-10-06 DIAGNOSIS — Z79891 Long term (current) use of opiate analgesic: Secondary | ICD-10-CM | POA: Diagnosis not present

## 2017-10-06 DIAGNOSIS — G4700 Insomnia, unspecified: Secondary | ICD-10-CM | POA: Diagnosis not present

## 2017-10-27 DIAGNOSIS — R32 Unspecified urinary incontinence: Secondary | ICD-10-CM | POA: Diagnosis not present

## 2017-11-23 DIAGNOSIS — Z79891 Long term (current) use of opiate analgesic: Secondary | ICD-10-CM | POA: Diagnosis not present

## 2017-11-23 DIAGNOSIS — R69 Illness, unspecified: Secondary | ICD-10-CM | POA: Diagnosis not present

## 2017-11-23 DIAGNOSIS — M150 Primary generalized (osteo)arthritis: Secondary | ICD-10-CM | POA: Diagnosis not present

## 2018-01-07 DIAGNOSIS — M150 Primary generalized (osteo)arthritis: Secondary | ICD-10-CM | POA: Diagnosis not present

## 2018-01-07 DIAGNOSIS — G4700 Insomnia, unspecified: Secondary | ICD-10-CM | POA: Diagnosis not present

## 2018-01-07 DIAGNOSIS — R69 Illness, unspecified: Secondary | ICD-10-CM | POA: Diagnosis not present

## 2018-01-07 DIAGNOSIS — Z79891 Long term (current) use of opiate analgesic: Secondary | ICD-10-CM | POA: Diagnosis not present

## 2018-02-04 DIAGNOSIS — M150 Primary generalized (osteo)arthritis: Secondary | ICD-10-CM | POA: Diagnosis not present

## 2018-02-04 DIAGNOSIS — Z23 Encounter for immunization: Secondary | ICD-10-CM | POA: Diagnosis not present

## 2018-02-04 DIAGNOSIS — G4700 Insomnia, unspecified: Secondary | ICD-10-CM | POA: Diagnosis not present

## 2018-02-04 DIAGNOSIS — Z79891 Long term (current) use of opiate analgesic: Secondary | ICD-10-CM | POA: Diagnosis not present

## 2018-03-04 DIAGNOSIS — M150 Primary generalized (osteo)arthritis: Secondary | ICD-10-CM | POA: Diagnosis not present

## 2018-03-04 DIAGNOSIS — R69 Illness, unspecified: Secondary | ICD-10-CM | POA: Diagnosis not present

## 2018-03-04 DIAGNOSIS — G4700 Insomnia, unspecified: Secondary | ICD-10-CM | POA: Diagnosis not present

## 2018-03-04 DIAGNOSIS — Z125 Encounter for screening for malignant neoplasm of prostate: Secondary | ICD-10-CM | POA: Diagnosis not present

## 2018-03-04 DIAGNOSIS — G8929 Other chronic pain: Secondary | ICD-10-CM | POA: Diagnosis not present

## 2018-03-04 DIAGNOSIS — G990 Autonomic neuropathy in diseases classified elsewhere: Secondary | ICD-10-CM | POA: Diagnosis not present

## 2018-03-04 DIAGNOSIS — Z79891 Long term (current) use of opiate analgesic: Secondary | ICD-10-CM | POA: Diagnosis not present

## 2018-04-12 DIAGNOSIS — Z79891 Long term (current) use of opiate analgesic: Secondary | ICD-10-CM | POA: Diagnosis not present

## 2018-04-12 DIAGNOSIS — M150 Primary generalized (osteo)arthritis: Secondary | ICD-10-CM | POA: Diagnosis not present

## 2018-05-10 DIAGNOSIS — R69 Illness, unspecified: Secondary | ICD-10-CM | POA: Diagnosis not present

## 2018-05-10 DIAGNOSIS — Z887 Allergy status to serum and vaccine status: Secondary | ICD-10-CM | POA: Diagnosis not present

## 2018-05-10 DIAGNOSIS — K219 Gastro-esophageal reflux disease without esophagitis: Secondary | ICD-10-CM | POA: Diagnosis not present

## 2018-05-10 DIAGNOSIS — G4700 Insomnia, unspecified: Secondary | ICD-10-CM | POA: Diagnosis not present

## 2018-05-10 DIAGNOSIS — Z79891 Long term (current) use of opiate analgesic: Secondary | ICD-10-CM | POA: Diagnosis not present

## 2018-05-10 DIAGNOSIS — M4810 Ankylosing hyperostosis [Forestier], site unspecified: Secondary | ICD-10-CM | POA: Diagnosis not present

## 2018-05-10 DIAGNOSIS — G8929 Other chronic pain: Secondary | ICD-10-CM | POA: Diagnosis not present

## 2018-05-10 DIAGNOSIS — G629 Polyneuropathy, unspecified: Secondary | ICD-10-CM | POA: Diagnosis not present

## 2018-05-10 DIAGNOSIS — Z9181 History of falling: Secondary | ICD-10-CM | POA: Diagnosis not present

## 2018-05-11 DIAGNOSIS — M150 Primary generalized (osteo)arthritis: Secondary | ICD-10-CM | POA: Diagnosis not present

## 2018-05-11 DIAGNOSIS — Z79891 Long term (current) use of opiate analgesic: Secondary | ICD-10-CM | POA: Diagnosis not present

## 2018-06-07 DIAGNOSIS — R69 Illness, unspecified: Secondary | ICD-10-CM | POA: Diagnosis not present

## 2018-06-07 DIAGNOSIS — G4700 Insomnia, unspecified: Secondary | ICD-10-CM | POA: Diagnosis not present

## 2018-06-07 DIAGNOSIS — M150 Primary generalized (osteo)arthritis: Secondary | ICD-10-CM | POA: Diagnosis not present

## 2018-06-07 DIAGNOSIS — G990 Autonomic neuropathy in diseases classified elsewhere: Secondary | ICD-10-CM | POA: Diagnosis not present

## 2018-08-05 DIAGNOSIS — R69 Illness, unspecified: Secondary | ICD-10-CM | POA: Diagnosis not present

## 2018-08-05 DIAGNOSIS — Z79891 Long term (current) use of opiate analgesic: Secondary | ICD-10-CM | POA: Diagnosis not present

## 2018-08-05 DIAGNOSIS — M150 Primary generalized (osteo)arthritis: Secondary | ICD-10-CM | POA: Diagnosis not present

## 2018-08-09 DIAGNOSIS — M179 Osteoarthritis of knee, unspecified: Secondary | ICD-10-CM | POA: Diagnosis not present

## 2018-08-09 DIAGNOSIS — G990 Autonomic neuropathy in diseases classified elsewhere: Secondary | ICD-10-CM | POA: Diagnosis not present

## 2018-08-09 DIAGNOSIS — M150 Primary generalized (osteo)arthritis: Secondary | ICD-10-CM | POA: Diagnosis not present

## 2018-08-23 DIAGNOSIS — J029 Acute pharyngitis, unspecified: Secondary | ICD-10-CM | POA: Diagnosis not present

## 2018-08-23 DIAGNOSIS — J069 Acute upper respiratory infection, unspecified: Secondary | ICD-10-CM | POA: Diagnosis not present

## 2018-09-08 DIAGNOSIS — G990 Autonomic neuropathy in diseases classified elsewhere: Secondary | ICD-10-CM | POA: Diagnosis not present

## 2018-09-08 DIAGNOSIS — M179 Osteoarthritis of knee, unspecified: Secondary | ICD-10-CM | POA: Diagnosis not present

## 2018-09-08 DIAGNOSIS — M150 Primary generalized (osteo)arthritis: Secondary | ICD-10-CM | POA: Diagnosis not present

## 2018-09-14 DIAGNOSIS — M150 Primary generalized (osteo)arthritis: Secondary | ICD-10-CM | POA: Diagnosis not present

## 2018-10-09 DIAGNOSIS — M179 Osteoarthritis of knee, unspecified: Secondary | ICD-10-CM | POA: Diagnosis not present

## 2018-10-09 DIAGNOSIS — G990 Autonomic neuropathy in diseases classified elsewhere: Secondary | ICD-10-CM | POA: Diagnosis not present

## 2018-10-09 DIAGNOSIS — M150 Primary generalized (osteo)arthritis: Secondary | ICD-10-CM | POA: Diagnosis not present

## 2018-10-12 DIAGNOSIS — M150 Primary generalized (osteo)arthritis: Secondary | ICD-10-CM | POA: Diagnosis not present

## 2018-10-12 DIAGNOSIS — Z79891 Long term (current) use of opiate analgesic: Secondary | ICD-10-CM | POA: Diagnosis not present

## 2018-10-12 DIAGNOSIS — Z23 Encounter for immunization: Secondary | ICD-10-CM | POA: Diagnosis not present

## 2018-11-08 DIAGNOSIS — G990 Autonomic neuropathy in diseases classified elsewhere: Secondary | ICD-10-CM | POA: Diagnosis not present

## 2018-11-08 DIAGNOSIS — M179 Osteoarthritis of knee, unspecified: Secondary | ICD-10-CM | POA: Diagnosis not present

## 2018-11-08 DIAGNOSIS — M150 Primary generalized (osteo)arthritis: Secondary | ICD-10-CM | POA: Diagnosis not present

## 2018-12-09 DIAGNOSIS — M179 Osteoarthritis of knee, unspecified: Secondary | ICD-10-CM | POA: Diagnosis not present

## 2018-12-09 DIAGNOSIS — G990 Autonomic neuropathy in diseases classified elsewhere: Secondary | ICD-10-CM | POA: Diagnosis not present

## 2018-12-09 DIAGNOSIS — R32 Unspecified urinary incontinence: Secondary | ICD-10-CM | POA: Diagnosis not present

## 2018-12-09 DIAGNOSIS — G4700 Insomnia, unspecified: Secondary | ICD-10-CM | POA: Diagnosis not present

## 2018-12-09 DIAGNOSIS — Z79891 Long term (current) use of opiate analgesic: Secondary | ICD-10-CM | POA: Diagnosis not present

## 2018-12-09 DIAGNOSIS — M150 Primary generalized (osteo)arthritis: Secondary | ICD-10-CM | POA: Diagnosis not present

## 2018-12-13 DIAGNOSIS — R32 Unspecified urinary incontinence: Secondary | ICD-10-CM | POA: Diagnosis not present

## 2019-01-09 DIAGNOSIS — M150 Primary generalized (osteo)arthritis: Secondary | ICD-10-CM | POA: Diagnosis not present

## 2019-01-09 DIAGNOSIS — M179 Osteoarthritis of knee, unspecified: Secondary | ICD-10-CM | POA: Diagnosis not present

## 2019-01-09 DIAGNOSIS — G990 Autonomic neuropathy in diseases classified elsewhere: Secondary | ICD-10-CM | POA: Diagnosis not present

## 2019-01-17 DIAGNOSIS — I10 Essential (primary) hypertension: Secondary | ICD-10-CM | POA: Diagnosis not present

## 2019-01-17 DIAGNOSIS — Z79891 Long term (current) use of opiate analgesic: Secondary | ICD-10-CM | POA: Diagnosis not present

## 2019-01-17 DIAGNOSIS — M150 Primary generalized (osteo)arthritis: Secondary | ICD-10-CM | POA: Diagnosis not present

## 2019-01-17 DIAGNOSIS — R32 Unspecified urinary incontinence: Secondary | ICD-10-CM | POA: Diagnosis not present

## 2019-02-07 DIAGNOSIS — M150 Primary generalized (osteo)arthritis: Secondary | ICD-10-CM | POA: Diagnosis not present

## 2019-02-07 DIAGNOSIS — Z79891 Long term (current) use of opiate analgesic: Secondary | ICD-10-CM | POA: Diagnosis not present

## 2019-02-07 DIAGNOSIS — M179 Osteoarthritis of knee, unspecified: Secondary | ICD-10-CM | POA: Diagnosis not present

## 2019-02-07 DIAGNOSIS — G990 Autonomic neuropathy in diseases classified elsewhere: Secondary | ICD-10-CM | POA: Diagnosis not present

## 2019-02-07 DIAGNOSIS — I959 Hypotension, unspecified: Secondary | ICD-10-CM | POA: Diagnosis not present

## 2019-02-07 DIAGNOSIS — I10 Essential (primary) hypertension: Secondary | ICD-10-CM | POA: Diagnosis not present

## 2019-03-10 DIAGNOSIS — M150 Primary generalized (osteo)arthritis: Secondary | ICD-10-CM | POA: Diagnosis not present

## 2019-03-10 DIAGNOSIS — M179 Osteoarthritis of knee, unspecified: Secondary | ICD-10-CM | POA: Diagnosis not present

## 2019-03-10 DIAGNOSIS — G990 Autonomic neuropathy in diseases classified elsewhere: Secondary | ICD-10-CM | POA: Diagnosis not present

## 2019-03-15 DIAGNOSIS — J029 Acute pharyngitis, unspecified: Secondary | ICD-10-CM | POA: Diagnosis not present

## 2019-03-15 DIAGNOSIS — Z79891 Long term (current) use of opiate analgesic: Secondary | ICD-10-CM | POA: Diagnosis not present

## 2019-03-15 DIAGNOSIS — R69 Illness, unspecified: Secondary | ICD-10-CM | POA: Diagnosis not present

## 2019-03-15 DIAGNOSIS — M150 Primary generalized (osteo)arthritis: Secondary | ICD-10-CM | POA: Diagnosis not present

## 2019-04-09 DIAGNOSIS — M150 Primary generalized (osteo)arthritis: Secondary | ICD-10-CM | POA: Diagnosis not present

## 2019-04-09 DIAGNOSIS — G990 Autonomic neuropathy in diseases classified elsewhere: Secondary | ICD-10-CM | POA: Diagnosis not present

## 2019-04-09 DIAGNOSIS — M179 Osteoarthritis of knee, unspecified: Secondary | ICD-10-CM | POA: Diagnosis not present

## 2019-04-14 ENCOUNTER — Other Ambulatory Visit (HOSPITAL_COMMUNITY): Payer: Self-pay | Admitting: Family Medicine

## 2019-04-14 ENCOUNTER — Other Ambulatory Visit: Payer: Self-pay

## 2019-04-14 ENCOUNTER — Ambulatory Visit (HOSPITAL_COMMUNITY)
Admission: RE | Admit: 2019-04-14 | Discharge: 2019-04-14 | Disposition: A | Discharge status: Home / Self Care | Payer: Medicare HMO | Source: Ambulatory Visit | Attending: Family Medicine | Admitting: Family Medicine

## 2019-04-14 DIAGNOSIS — W19XXXA Unspecified fall, initial encounter: Secondary | ICD-10-CM | POA: Diagnosis not present

## 2019-04-14 DIAGNOSIS — M5489 Other dorsalgia: Secondary | ICD-10-CM | POA: Diagnosis not present

## 2019-04-14 DIAGNOSIS — Z79891 Long term (current) use of opiate analgesic: Secondary | ICD-10-CM | POA: Diagnosis not present

## 2019-04-14 DIAGNOSIS — M545 Low back pain: Secondary | ICD-10-CM | POA: Diagnosis not present

## 2019-04-14 DIAGNOSIS — M150 Primary generalized (osteo)arthritis: Secondary | ICD-10-CM | POA: Diagnosis not present

## 2019-04-19 ENCOUNTER — Emergency Department (HOSPITAL_COMMUNITY): Payer: Medicare HMO

## 2019-04-19 ENCOUNTER — Inpatient Hospital Stay (HOSPITAL_COMMUNITY): Payer: Medicare HMO

## 2019-04-19 ENCOUNTER — Encounter (HOSPITAL_COMMUNITY): Payer: Self-pay | Admitting: *Deleted

## 2019-04-19 ENCOUNTER — Other Ambulatory Visit: Payer: Self-pay

## 2019-04-19 ENCOUNTER — Inpatient Hospital Stay (HOSPITAL_COMMUNITY)
Admission: EM | Admit: 2019-04-19 | Discharge: 2019-04-26 | DRG: 682 | Disposition: A | Discharge status: Skilled Nursing Facility | Payer: Medicare HMO | Attending: Internal Medicine | Admitting: Internal Medicine

## 2019-04-19 DIAGNOSIS — Z9181 History of falling: Secondary | ICD-10-CM | POA: Diagnosis not present

## 2019-04-19 DIAGNOSIS — M25551 Pain in right hip: Secondary | ICD-10-CM | POA: Diagnosis not present

## 2019-04-19 DIAGNOSIS — S52611A Displaced fracture of right ulna styloid process, initial encounter for closed fracture: Secondary | ICD-10-CM | POA: Diagnosis not present

## 2019-04-19 DIAGNOSIS — M25512 Pain in left shoulder: Secondary | ICD-10-CM | POA: Diagnosis not present

## 2019-04-19 DIAGNOSIS — M4810 Ankylosing hyperostosis [Forestier], site unspecified: Secondary | ICD-10-CM | POA: Diagnosis present

## 2019-04-19 DIAGNOSIS — S79811A Other specified injuries of right hip, initial encounter: Secondary | ICD-10-CM | POA: Diagnosis not present

## 2019-04-19 DIAGNOSIS — M6281 Muscle weakness (generalized): Secondary | ICD-10-CM | POA: Diagnosis not present

## 2019-04-19 DIAGNOSIS — W1830XA Fall on same level, unspecified, initial encounter: Secondary | ICD-10-CM | POA: Diagnosis not present

## 2019-04-19 DIAGNOSIS — Z1159 Encounter for screening for other viral diseases: Secondary | ICD-10-CM | POA: Diagnosis not present

## 2019-04-19 DIAGNOSIS — Y92013 Bedroom of single-family (private) house as the place of occurrence of the external cause: Secondary | ICD-10-CM | POA: Diagnosis not present

## 2019-04-19 DIAGNOSIS — M25552 Pain in left hip: Secondary | ICD-10-CM | POA: Diagnosis not present

## 2019-04-19 DIAGNOSIS — R1110 Vomiting, unspecified: Secondary | ICD-10-CM | POA: Diagnosis not present

## 2019-04-19 DIAGNOSIS — Z20828 Contact with and (suspected) exposure to other viral communicable diseases: Secondary | ICD-10-CM | POA: Diagnosis not present

## 2019-04-19 DIAGNOSIS — Z79899 Other long term (current) drug therapy: Secondary | ICD-10-CM

## 2019-04-19 DIAGNOSIS — M7989 Other specified soft tissue disorders: Secondary | ICD-10-CM

## 2019-04-19 DIAGNOSIS — S52614D Nondisplaced fracture of right ulna styloid process, subsequent encounter for closed fracture with routine healing: Secondary | ICD-10-CM | POA: Diagnosis not present

## 2019-04-19 DIAGNOSIS — G8929 Other chronic pain: Secondary | ICD-10-CM | POA: Diagnosis not present

## 2019-04-19 DIAGNOSIS — S79812A Other specified injuries of left hip, initial encounter: Secondary | ICD-10-CM | POA: Diagnosis not present

## 2019-04-19 DIAGNOSIS — T796XXA Traumatic ischemia of muscle, initial encounter: Secondary | ICD-10-CM | POA: Diagnosis present

## 2019-04-19 DIAGNOSIS — G894 Chronic pain syndrome: Secondary | ICD-10-CM

## 2019-04-19 DIAGNOSIS — K219 Gastro-esophageal reflux disease without esophagitis: Secondary | ICD-10-CM | POA: Diagnosis present

## 2019-04-19 DIAGNOSIS — N179 Acute kidney failure, unspecified: Secondary | ICD-10-CM | POA: Diagnosis not present

## 2019-04-19 DIAGNOSIS — H9190 Unspecified hearing loss, unspecified ear: Secondary | ICD-10-CM | POA: Insufficient documentation

## 2019-04-19 DIAGNOSIS — R7989 Other specified abnormal findings of blood chemistry: Secondary | ICD-10-CM

## 2019-04-19 DIAGNOSIS — M6282 Rhabdomyolysis: Secondary | ICD-10-CM | POA: Diagnosis not present

## 2019-04-19 DIAGNOSIS — N390 Urinary tract infection, site not specified: Secondary | ICD-10-CM | POA: Diagnosis not present

## 2019-04-19 DIAGNOSIS — D72829 Elevated white blood cell count, unspecified: Secondary | ICD-10-CM | POA: Diagnosis not present

## 2019-04-19 DIAGNOSIS — S52501A Unspecified fracture of the lower end of right radius, initial encounter for closed fracture: Secondary | ICD-10-CM | POA: Diagnosis not present

## 2019-04-19 DIAGNOSIS — Z7952 Long term (current) use of systemic steroids: Secondary | ICD-10-CM | POA: Diagnosis not present

## 2019-04-19 DIAGNOSIS — S52591A Other fractures of lower end of right radius, initial encounter for closed fracture: Secondary | ICD-10-CM | POA: Diagnosis not present

## 2019-04-19 DIAGNOSIS — W19XXXA Unspecified fall, initial encounter: Secondary | ICD-10-CM

## 2019-04-19 DIAGNOSIS — S52591D Other fractures of lower end of right radius, subsequent encounter for closed fracture with routine healing: Secondary | ICD-10-CM | POA: Diagnosis not present

## 2019-04-19 DIAGNOSIS — R4182 Altered mental status, unspecified: Secondary | ICD-10-CM | POA: Diagnosis present

## 2019-04-19 DIAGNOSIS — S0990XA Unspecified injury of head, initial encounter: Secondary | ICD-10-CM | POA: Diagnosis not present

## 2019-04-19 DIAGNOSIS — T796XXD Traumatic ischemia of muscle, subsequent encounter: Secondary | ICD-10-CM | POA: Diagnosis not present

## 2019-04-19 DIAGNOSIS — M4692 Unspecified inflammatory spondylopathy, cervical region: Secondary | ICD-10-CM | POA: Diagnosis not present

## 2019-04-19 DIAGNOSIS — Z79891 Long term (current) use of opiate analgesic: Secondary | ICD-10-CM

## 2019-04-19 DIAGNOSIS — E876 Hypokalemia: Secondary | ICD-10-CM | POA: Diagnosis present

## 2019-04-19 DIAGNOSIS — R2689 Other abnormalities of gait and mobility: Secondary | ICD-10-CM | POA: Diagnosis not present

## 2019-04-19 DIAGNOSIS — F17220 Nicotine dependence, chewing tobacco, uncomplicated: Secondary | ICD-10-CM | POA: Diagnosis present

## 2019-04-19 DIAGNOSIS — G92 Toxic encephalopathy: Secondary | ICD-10-CM | POA: Diagnosis not present

## 2019-04-19 DIAGNOSIS — M545 Low back pain: Secondary | ICD-10-CM | POA: Diagnosis not present

## 2019-04-19 DIAGNOSIS — S52531A Colles' fracture of right radius, initial encounter for closed fracture: Secondary | ICD-10-CM | POA: Diagnosis not present

## 2019-04-19 DIAGNOSIS — M7918 Myalgia, other site: Secondary | ICD-10-CM

## 2019-04-19 DIAGNOSIS — R945 Abnormal results of liver function studies: Secondary | ICD-10-CM | POA: Diagnosis not present

## 2019-04-19 DIAGNOSIS — R41841 Cognitive communication deficit: Secondary | ICD-10-CM | POA: Diagnosis not present

## 2019-04-19 DIAGNOSIS — R079 Chest pain, unspecified: Secondary | ICD-10-CM | POA: Diagnosis not present

## 2019-04-19 DIAGNOSIS — R69 Illness, unspecified: Secondary | ICD-10-CM | POA: Diagnosis not present

## 2019-04-19 LAB — CBC WITH DIFFERENTIAL/PLATELET
Abs Immature Granulocytes: 0.08 10*3/uL — ABNORMAL HIGH (ref 0.00–0.07)
Basophils Absolute: 0 10*3/uL (ref 0.0–0.1)
Basophils Relative: 0 %
Eosinophils Absolute: 0.1 10*3/uL (ref 0.0–0.5)
Eosinophils Relative: 0 %
HCT: 41.7 % (ref 39.0–52.0)
Hemoglobin: 14.3 g/dL (ref 13.0–17.0)
Immature Granulocytes: 1 %
Lymphocytes Relative: 7 %
Lymphs Abs: 1.1 10*3/uL (ref 0.7–4.0)
MCH: 29.5 pg (ref 26.0–34.0)
MCHC: 34.3 g/dL (ref 30.0–36.0)
MCV: 86 fL (ref 80.0–100.0)
Monocytes Absolute: 1 10*3/uL (ref 0.1–1.0)
Monocytes Relative: 7 %
Neutro Abs: 12.7 10*3/uL — ABNORMAL HIGH (ref 1.7–7.7)
Neutrophils Relative %: 85 %
Platelets: 309 10*3/uL (ref 150–400)
RBC: 4.85 MIL/uL (ref 4.22–5.81)
RDW: 13.2 % (ref 11.5–15.5)
WBC: 15 10*3/uL — ABNORMAL HIGH (ref 4.0–10.5)
nRBC: 0 % (ref 0.0–0.2)

## 2019-04-19 LAB — CREATININE, SERUM
Creatinine, Ser: 2.2 mg/dL — ABNORMAL HIGH (ref 0.61–1.24)
GFR calc Af Amer: 34 mL/min — ABNORMAL LOW (ref >60)
GFR calc non Af Amer: 29 mL/min — ABNORMAL LOW (ref >60)

## 2019-04-19 LAB — CK
Total CK: 9598 U/L — ABNORMAL HIGH (ref 49–397)
Total CK: 9702 U/L — ABNORMAL HIGH (ref 49–397)

## 2019-04-19 LAB — COMPREHENSIVE METABOLIC PANEL
ALT: 107 U/L — ABNORMAL HIGH (ref 0–44)
AST: 264 U/L — ABNORMAL HIGH (ref 15–41)
Albumin: 3.1 g/dL — ABNORMAL LOW (ref 3.5–5.0)
Alkaline Phosphatase: 131 U/L — ABNORMAL HIGH (ref 38–126)
Anion gap: 14 (ref 5–15)
BUN: 52 mg/dL — ABNORMAL HIGH (ref 8–23)
CO2: 25 mmol/L (ref 22–32)
Calcium: 8.4 mg/dL — ABNORMAL LOW (ref 8.9–10.3)
Chloride: 93 mmol/L — ABNORMAL LOW (ref 98–111)
Creatinine, Ser: 2.52 mg/dL — ABNORMAL HIGH (ref 0.61–1.24)
GFR calc Af Amer: 29 mL/min — ABNORMAL LOW (ref >60)
GFR calc non Af Amer: 25 mL/min — ABNORMAL LOW (ref >60)
Glucose, Bld: 132 mg/dL — ABNORMAL HIGH (ref 70–99)
Potassium: 3.7 mmol/L (ref 3.5–5.1)
Sodium: 132 mmol/L — ABNORMAL LOW (ref 135–145)
Total Bilirubin: 0.7 mg/dL (ref 0.3–1.2)
Total Protein: 6.7 g/dL (ref 6.5–8.1)

## 2019-04-19 LAB — SARS CORONAVIRUS 2 BY RT PCR (HOSPITAL ORDER, PERFORMED IN ~~LOC~~ HOSPITAL LAB): SARS Coronavirus 2: NEGATIVE

## 2019-04-19 LAB — URIC ACID: Uric Acid, Serum: 10.4 mg/dL — ABNORMAL HIGH (ref 3.7–8.6)

## 2019-04-19 MED ORDER — ENOXAPARIN SODIUM 40 MG/0.4ML ~~LOC~~ SOLN
40.0000 mg | SUBCUTANEOUS | Status: DC
  Filled 2019-04-19: qty 0.4

## 2019-04-19 MED ORDER — HYDROMORPHONE HCL 1 MG/ML IJ SOLN
1.0000 mg | INTRAMUSCULAR | Status: DC | PRN
  Administered 2019-04-19 – 2019-04-20 (×3): 1 mg via INTRAVENOUS
  Filled 2019-04-19 (×3): qty 1

## 2019-04-19 MED ORDER — SODIUM CHLORIDE 0.9 % IV BOLUS
1000.0000 mL | Freq: Once | INTRAVENOUS | Status: AC
  Administered 2019-04-19: 1000 mL via INTRAVENOUS

## 2019-04-19 MED ORDER — GABAPENTIN 600 MG PO TABS
600.0000 mg | ORAL_TABLET | Freq: Every day | ORAL | Status: DC
  Filled 2019-04-19 (×2): qty 1

## 2019-04-19 MED ORDER — ONDANSETRON HCL 4 MG/2ML IJ SOLN
4.0000 mg | Freq: Once | INTRAMUSCULAR | Status: AC
  Administered 2019-04-19: 4 mg via INTRAVENOUS
  Filled 2019-04-19: qty 2

## 2019-04-19 MED ORDER — MORPHINE SULFATE ER 30 MG PO TBCR
30.0000 mg | EXTENDED_RELEASE_TABLET | Freq: Two times a day (BID) | ORAL | Status: DC
  Administered 2019-04-19 – 2019-04-20 (×2): 30 mg via ORAL
  Filled 2019-04-19 (×4): qty 1

## 2019-04-19 MED ORDER — ONDANSETRON HCL 4 MG PO TABS: 4.0000 mg | ORAL_TABLET | Freq: Four times a day (QID) | ORAL | Status: DC | PRN

## 2019-04-19 MED ORDER — MORPHINE SULFATE ER 15 MG PO TBCR: 15.0000 mg | EXTENDED_RELEASE_TABLET | Freq: Two times a day (BID) | ORAL | Status: DC

## 2019-04-19 MED ORDER — DULOXETINE HCL 60 MG PO CPEP
60.0000 mg | ORAL_CAPSULE | Freq: Two times a day (BID) | ORAL | Status: DC
  Administered 2019-04-19 – 2019-04-23 (×8): 60 mg via ORAL
  Filled 2019-04-19 (×8): qty 1

## 2019-04-19 MED ORDER — HYDROMORPHONE HCL 1 MG/ML IJ SOLN
1.0000 mg | Freq: Once | INTRAMUSCULAR | Status: AC
  Administered 2019-04-19: 1 mg via INTRAVENOUS
  Filled 2019-04-19: qty 1

## 2019-04-19 MED ORDER — ENOXAPARIN SODIUM 60 MG/0.6ML ~~LOC~~ SOLN
50.0000 mg | SUBCUTANEOUS | Status: DC
  Administered 2019-04-19 – 2019-04-25 (×7): 50 mg via SUBCUTANEOUS
  Filled 2019-04-19 (×7): qty 0.6

## 2019-04-19 MED ORDER — TEMAZEPAM 15 MG PO CAPS
30.0000 mg | ORAL_CAPSULE | Freq: Every evening | ORAL | Status: DC | PRN
  Administered 2019-04-19: 30 mg via ORAL
  Filled 2019-04-19: qty 2

## 2019-04-19 MED ORDER — HYDROMORPHONE HCL 2 MG/ML IJ SOLN
2.0000 mg | Freq: Once | INTRAMUSCULAR | Status: AC
  Administered 2019-04-19: 2 mg via INTRAVENOUS
  Filled 2019-04-19: qty 1

## 2019-04-19 MED ORDER — TRAZODONE HCL 50 MG PO TABS
50.0000 mg | ORAL_TABLET | Freq: Every day | ORAL | Status: DC
  Administered 2019-04-19 – 2019-04-20 (×2): 50 mg via ORAL
  Filled 2019-04-19 (×2): qty 1

## 2019-04-19 MED ORDER — OXYCODONE HCL 5 MG PO TABS
5.0000 mg | ORAL_TABLET | Freq: Four times a day (QID) | ORAL | Status: DC | PRN
  Administered 2019-04-19 – 2019-04-20 (×2): 5 mg via ORAL
  Filled 2019-04-19 (×2): qty 1

## 2019-04-19 MED ORDER — SODIUM CHLORIDE 0.9 % IV SOLN
INTRAVENOUS | Status: DC
  Administered 2019-04-19 – 2019-04-22 (×8): via INTRAVENOUS

## 2019-04-19 MED ORDER — POLYETHYLENE GLYCOL 3350 17 G PO PACK: 17.0000 g | PACK | Freq: Every day | ORAL | Status: DC | PRN

## 2019-04-19 MED ORDER — PANTOPRAZOLE SODIUM 40 MG PO TBEC
40.0000 mg | DELAYED_RELEASE_TABLET | Freq: Every day | ORAL | Status: DC
  Administered 2019-04-20 – 2019-04-26 (×7): 40 mg via ORAL
  Filled 2019-04-19 (×8): qty 1

## 2019-04-19 MED ORDER — GABAPENTIN 300 MG PO CAPS
600.0000 mg | ORAL_CAPSULE | Freq: Every day | ORAL | Status: DC
  Administered 2019-04-19 – 2019-04-25 (×7): 600 mg via ORAL
  Filled 2019-04-19 (×7): qty 2

## 2019-04-19 MED ORDER — MORPHINE SULFATE (PF) 4 MG/ML IV SOLN
4.0000 mg | Freq: Once | INTRAVENOUS | Status: AC
  Administered 2019-04-19: 4 mg via INTRAVENOUS
  Filled 2019-04-19: qty 1

## 2019-04-19 MED ORDER — MORPHINE SULFATE ER 30 MG PO TBCR: 60.0000 mg | EXTENDED_RELEASE_TABLET | Freq: Two times a day (BID) | ORAL | Status: DC

## 2019-04-19 MED ORDER — ONDANSETRON HCL 4 MG/2ML IJ SOLN
4.0000 mg | Freq: Four times a day (QID) | INTRAMUSCULAR | Status: DC | PRN
  Administered 2019-04-21 – 2019-04-22 (×2): 4 mg via INTRAVENOUS
  Filled 2019-04-19 (×2): qty 2

## 2019-04-19 NOTE — ED Notes (Signed)
Pt refused xrays for the night

## 2019-04-19 NOTE — ED Notes (Signed)
Pt in xray

## 2019-04-19 NOTE — ED Provider Notes (Signed)
[facility] EMERGENCY DEPARTMENT Provider Note   CSN: [id number] Arrival date & time: 04/19/19  1245    History   Chief Complaint Chief Complaint  Patient presents with   Arm Injury    HPI [name] is a 68 y.o. male pt of Dr Sudie Bailey with a past medical history significant for DISH causing chronic pain, Genella Rife and is hearing impaired presenting with pain in his left upper arm/shoulder and also in his left hip and posterior buttocks, apparently fell 2 nights ago and was on the floor until around noon yesterday before a neighbor came in and helped him up.  He does not recall falling, stating he was in his bed sleeping, and when he woke he was lying on his spare bedroom floor.  To his knowledge he does not sleep walk but is on narcotic medications along with anxiety meds and restoril for qhs use.  He spent yesterday in bed but decided his pain was too severe, so presents for evaluation. He has a prescription for MS Contin with oxycodone for breakthrough pain but reports he does not take this daily.  His last dose of either pain medication was taken the night he fell.  He endorses a mild headache, denies numbness or weakness in his extremities, denies dizziness but does endorse generalized myalgia.  No cp, sob, abd pain, n/v.      The history is provided by the patient.    Past Medical History:  Diagnosis Date   Cataract    Chronic pain    DISH (diffuse idiopathic skeletal hyperostosis)    GERD (gastroesophageal reflux disease)    Impaired hearing     Patient Active Problem List   Diagnosis Date Noted   Rhabdomyolysis 04/19/2019   DISH (diffuse idiopathic skeletal hyperostosis)    GERD (gastroesophageal reflux disease)    Impaired hearing    Chronic pain     Past Surgical History:  Procedure Laterality Date   BACK SURGERY     X2   CATARACT EXTRACTION W/PHACO  12/03/2011   Procedure: CATARACT EXTRACTION PHACO AND INTRAOCULAR LENS PLACEMENT (IOC);  Surgeon:  Gemma Payor;  Location: [facility];  Service: Ophthalmology;  Laterality: Right;  CDE: 7.86   CATARACT EXTRACTION W/PHACO  12/21/2011   Procedure: CATARACT EXTRACTION PHACO AND INTRAOCULAR LENS PLACEMENT (IOC);  Surgeon: Gemma Payor, MD;  Location: [facility];  Service: Ophthalmology;  Laterality: Left;  CDE=5.51   KNEE ARTHROSCOPY W/ ACL RECONSTRUCTION  05/2000   right, [facility]BO   KNEE SURGERY  10/2000   remove screw after ACL   LAMINECTOMY     TUMOR EXCISION     back, cyst        Home Medications    Prior to Admission medications   Medication Sig Start Date End Date Taking? Authorizing Provider  ALPRAZolam Prudy Feeler) 1 MG tablet Take 1.5 mg by mouth at bedtime as needed. Sleep     [provider]  azithromycin (ZITHROMAX) 250 MG tablet Take 1-2 tablets by mouth as directed. Patient takes 2 tablets by mouth on day 1 of treatment; on days 2-5, patient takes 1 tablet daily 04/17/19   [provider]  DULoxetine (CYMBALTA) 60 MG capsule Take 60 mg by mouth 2 (two) times daily.     [provider]  fluticasone (CUTIVATE) 0.05 % cream Apply 1 application topically daily as needed. 12/21/18   [provider]  gabapentin (NEURONTIN) 600 MG tablet Take 600 mg by mouth at bedtime.  [provider]  ketoconazole (NIZORAL) 2 % shampoo Apply 1 application topically daily as needed. 12/16/18   [provider]  lansoprazole (PREVACID) 30 MG capsule Take 30 mg by mouth daily.     [provider]  morphine (MS CONTIN) 15 MG 12 hr tablet Take 15 mg by mouth 2 (two) times daily.      [provider]  morphine (MS CONTIN) 60 MG 12 hr tablet Take 60 mg by mouth 2 (two) times daily.      [provider]  oxyCODONE (OXY IR/ROXICODONE) 5 MG immediate release tablet Take 5 mg by mouth 3 (three) times daily.      [provider]  sildenafil (VIAGRA) 100 MG tablet Take 1 tablet by mouth as directed. 01/17/19   [provider]    temazepam (RESTORIL) 30 MG capsule Take 30 mg by mouth at bedtime as needed. Sleep     [provider]  traZODone (DESYREL) 50 MG tablet Take 1 tablet by mouth at bedtime. 03/15/19   [provider]    Family History Family History  Problem Relation Age of Onset   Anesthesia problems Neg Hx    Hypotension Neg Hx    Malignant hyperthermia Neg Hx    Pseudochol deficiency Neg Hx     Social History Social History   Tobacco Use   Smoking status: Never Smoker   Smokeless tobacco: Current User    Types: Chew  Substance Use Topics   Alcohol use: No   Drug use: No     Allergies   Ceclor [cefaclor]; Nsaids; and Other   Review of Systems Review of Systems  Constitutional: Negative for fever.  HENT: Negative for congestion and sore throat.   Eyes: Negative.   Respiratory: Negative for chest tightness and shortness of breath.   Cardiovascular: Negative for chest pain.  Gastrointestinal: Negative for abdominal pain, nausea and vomiting.  Genitourinary: Negative.   Musculoskeletal: Positive for arthralgias, back pain and joint swelling. Negative for neck pain.  Skin: Negative.  Negative for rash and wound.  Neurological: Positive for weakness. Negative for dizziness, light-headedness, numbness and headaches.  Psychiatric/Behavioral: Negative.      Physical Exam Updated Vital Signs BP 102/77    Pulse 97    Temp 98.9 F (37.2 C) (Oral)    Resp 12    Ht  (1.727 m)    Wt 99.8 kg    SpO2 100%    BMI 33.45 kg/m   Physical Exam Vitals signs and nursing note reviewed.  Constitutional:      Appearance: He is well-developed.  HENT:     Head: Normocephalic and atraumatic.  Eyes:     Conjunctiva/sclera: Conjunctivae normal.  Neck:     Musculoskeletal: Muscular tenderness present.     Comments: Paracervical ttp. No midline pain or palpable deformity. Cardiovascular:     Rate and Rhythm: Normal rate and regular rhythm.     Heart sounds: Normal heart  sounds.  Pulmonary:     Effort: Pulmonary effort is normal.     Breath sounds: Normal breath sounds. No wheezing.  Abdominal:     General: Bowel sounds are normal.     Palpations: Abdomen is soft.     Tenderness: There is no abdominal tenderness. There is no guarding.  Musculoskeletal: Normal range of motion.        General: Tenderness present. No deformity.     Left shoulder: He exhibits tenderness and swelling.  Right hip: He exhibits no bony tenderness and no crepitus.     Left hip: He exhibits bony tenderness. He exhibits no crepitus.     Comments: ttp with edema noted left upper arm.  ttp left anterior and posterior shoulder.  No clavicle ttp or deformity. Arm is soft.  Elbow and forearm nontender.  Radial pulse intact.  Lateral ttp with attempts to passively internal/external rotate the left leg at the hip.  No shortening or external rotation.  Skin:    General: Skin is warm and dry.  Neurological:     Mental Status: He is alert.      ED Treatments / Results  Labs (all labs ordered are listed, but only abnormal results are displayed) Labs Reviewed  CBC WITH DIFFERENTIAL/PLATELET - Abnormal; Notable for the following components:      Result Value   WBC 15.0 (*)    Neutro Abs 12.7 (*)    Abs Immature Granulocytes 0.08 (*)    All other components within normal limits  COMPREHENSIVE METABOLIC PANEL - Abnormal; Notable for the following components:   Sodium 132 (*)    Chloride 93 (*)    Glucose, Bld 132 (*)    BUN 52 (*)    Creatinine, Ser 2.52 (*)    Calcium 8.4 (*)    Albumin 3.1 (*)    AST 264 (*)    ALT 107 (*)    Alkaline Phosphatase 131 (*)    GFR calc non Af Amer 25 (*)    GFR calc Af Amer 29 (*)    All other components within normal limits  CK - Abnormal; Notable for the following components:   Total CK 9,598 (*)    All other components within normal limits  SARS CORONAVIRUS 2 (HOSPITAL ORDER, PERFORMED IN [facility])  URIC ACID  CK     EKG None  Radiology Dg Chest 1 View  Result Date: 04/19/2019 CLINICAL DATA:  Pain status post fall EXAM: CHEST  1 VIEW COMPARISON:  02/16/2012 FINDINGS: The heart size and mediastinal contours are within normal limits. Both lungs are clear. The visualized skeletal structures are unremarkable. IMPRESSION: No active disease. Electronically Signed   By: Katherine Mantlehristopher  Green M.D.   On: 04/19/2019 15:54   Ct Head Wo Contrast  Result Date: 04/19/2019 CLINICAL DATA:  Fall. EXAM: CT HEAD WITHOUT CONTRAST CT CERVICAL SPINE WITHOUT CONTRAST TECHNIQUE: Multidetector CT imaging of the head and cervical spine was performed following the standard protocol without intravenous contrast. Multiplanar CT image reconstructions of the cervical spine were also generated. COMPARISON:  Cervical spine x-rays dated October 07, 2009. FINDINGS: CT HEAD FINDINGS Brain: No evidence of acute infarction, hemorrhage, hydrocephalus, extra-axial collection or mass lesion/mass effect. Vascular: No hyperdense vessel or unexpected calcification. Skull: Normal. Negative for fracture or focal lesion. Sinuses/Orbits: No acute finding. Other: None. CT CERVICAL SPINE FINDINGS Alignment: Straightening of the normal cervical lordosis. No traumatic malalignment. Skull base and vertebrae: No acute fracture. No primary bone lesion or focal pathologic process. Soft tissues and spinal canal: No prevertebral fluid or swelling. No visible canal hematoma. Disc levels: Disc heights are relatively preserved. Evidence of diffuse idiopathic skeletal hyperostosis. Ankylosed bilateral facet joints at C3-C4 and C4-C5. Ankylosed bilateral uncovertebral joints from C3-C4 through C6-C7. Moderate to severe bilateral facet arthropathy at C2-C3. Upper chest: Negative. Other: None. IMPRESSION: 1.  No acute intracranial abnormality. 2.  No acute cervical spine fracture. Electronically Signed   By: Obie DredgeWilliam T Derry M.D.   On:  04/19/2019 15:28   Ct Cervical Spine Wo  Contrast  Result Date: 04/19/2019 CLINICAL DATA:  Fall. EXAM: CT HEAD WITHOUT CONTRAST CT CERVICAL SPINE WITHOUT CONTRAST TECHNIQUE: Multidetector CT imaging of the head and cervical spine was performed following the standard protocol without intravenous contrast. Multiplanar CT image reconstructions of the cervical spine were also generated. COMPARISON:  Cervical spine x-rays dated October 07, 2009. FINDINGS: CT HEAD FINDINGS Brain: No evidence of acute infarction, hemorrhage, hydrocephalus, extra-axial collection or mass lesion/mass effect. Vascular: No hyperdense vessel or unexpected calcification. Skull: Normal. Negative for fracture or focal lesion. Sinuses/Orbits: No acute finding. Other: None. CT CERVICAL SPINE FINDINGS Alignment: Straightening of the normal cervical lordosis. No traumatic malalignment. Skull base and vertebrae: No acute fracture. No primary bone lesion or focal pathologic process. Soft tissues and spinal canal: No prevertebral fluid or swelling. No visible canal hematoma. Disc levels: Disc heights are relatively preserved. Evidence of diffuse idiopathic skeletal hyperostosis. Ankylosed bilateral facet joints at C3-C4 and C4-C5. Ankylosed bilateral uncovertebral joints from C3-C4 through C6-C7. Moderate to severe bilateral facet arthropathy at C2-C3. Upper chest: Negative. Other: None. IMPRESSION: 1.  No acute intracranial abnormality. 2.  No acute cervical spine fracture. Electronically Signed   By: Obie Dredge M.D.   On: 04/19/2019 15:28   Dg Shoulder Left  Result Date: 04/19/2019 CLINICAL DATA:  Pain status post fall EXAM: LEFT SHOULDER - 2+ VIEW COMPARISON:  None. FINDINGS: There is no acute displaced fracture or dislocation. There are degenerative changes of the glenohumeral and acromioclavicular joints. The osseous mineralization is within normal limits. IMPRESSION: No acute osseous abnormality. Electronically Signed   By: Katherine Mantle M.D.   On: 04/19/2019 15:53   Dg  Humerus Left  Result Date: 04/19/2019 CLINICAL DATA:  68 year old male with a history of fall EXAM: LEFT HUMERUS - 2+ VIEW COMPARISON:  None FINDINGS: No acute displaced fracture. Glenohumeral joint appears congruent. No radiopaque foreign body. IMPRESSION: Negative for acute bony abnormality. Electronically Signed   By: Gilmer Mor D.O.   On: 04/19/2019 15:56   Dg Hips Bilat With Pelvis Min 5 Views  Result Date: 04/19/2019 CLINICAL DATA:  Pain status post fall EXAM: DG HIP (WITH OR WITHOUT PELVIS) 5+V BILAT COMPARISON:  None. FINDINGS: There is no acute displaced fracture or dislocation. Mild-to-moderate degenerative changes are noted of both hips. IMPRESSION: No acute osseous abnormality. Electronically Signed   By: Katherine Mantle M.D.   On: 04/19/2019 15:56    Procedures Procedures (including critical care time)  Medications Ordered in ED Medications  0.9 %  sodium chloride infusion ( Intravenous Hold 04/19/19 1634)  morphine 4 MG/ML injection 4 mg (4 mg Intravenous Given 04/19/19 1431)  ondansetron (ZOFRAN) injection 4 mg (4 mg Intravenous Given 04/19/19 1431)  sodium chloride 0.9 % bolus 1,000 mL (1,000 mLs Intravenous New Bag/Given 04/19/19 1614)  HYDROmorphone (DILAUDID) injection 1 mg (1 mg Intravenous Given 04/19/19 1612)  HYDROmorphone (DILAUDID) injection 2 mg (2 mg Intravenous Given 04/19/19 1630)  sodium chloride 0.9 % bolus 1,000 mL (1,000 mLs Intravenous New Bag/Given 04/19/19 1630)     Initial Impression / Assessment and Plan / ED Course  I have reviewed the triage vital signs and the nursing notes.  Pertinent labs & imaging results that were available during my care of the patient were reviewed by me and considered in my medical decision making (see chart for details).        Imaging and labs reviewed and discussed with pt.  He was given  morphine 4 mg IV with transient improvement.  Added dilaudid 1 mg without relief, increased to dilaudid 2 mg.  IV fluids started.   Pt will need admission which was discussed with Dr. Adrian Blackwater.    At recheck of pain, he endorses new worsened and severe pain in the lumbar spine since having to move in xray.  Reports prior lumbar surgery x 2.  Imaging ordered.  Final Clinical Impressions(s) / ED Diagnoses   Final diagnoses:  Traumatic rhabdomyolysis, initial encounter [facility])  Musculoskeletal pain  Acute renal injury [facility])    ED Discharge Orders    None       Victoriano Lain 04/19/19 1641    Samuel Jester, DO 04/24/19 [phone]

## 2019-04-19 NOTE — H&P (Addendum)
History and Physical  [name]:[id number] DOB: Aug 24, 1951 DOA: 04/19/2019  Referring physician: Burgess Amor, Cordelia Poche, ED provider PCP: Gareth Morgan, MD  Outpatient Specialists:   Patient Coming From: Home  Chief Complaint: Generalized pain  HPI: [name] is a 68 y.o. male with a history of diffuse idiopathic skeletal hyperostosis (DISH), chronic pain management, GERD, impaired hearing.  Patient seen after a fall Monday night.  Patient does not recall the fall but woke up on the floor in the morning.  He laid on the floor until approximately noon Tuesday, when a neighbor checked on him and helped him to the bed.  Patient laid in bed for the next 24 hours attempting to recover, however continue to mount increasing pain.  Pain mostly in the patient's back, left buttock, left shoulder.  Pain is nonradiating and is severe.  No palliating or provoking factors.  Patient has not been taking his pain medication since the fall.  Emergency Department Course: Pelvis x-ray, shoulder x-ray, CT scan of head and neck all negative.  Creatinine elevated to 0.52 with elevated LFTs: AST 264 and ALT 107.  CK total is 9500.  Patient has a white blood cell count of 15.  Patient bolused with 1 L IV fluids and given IV pain management with morphine 4 mg and Dilaudid 1 mg.  Review of Systems:   Pt denies any fevers, chills, nausea, vomiting, diarrhea, constipation, abdominal pain, shortness of breath, dyspnea on exertion, orthopnea, cough, wheezing, palpitations, headache, vision changes, lightheadedness, dizziness, melena, rectal bleeding.  Review of systems are otherwise negative  Past Medical History:  Diagnosis Date   Cataract    Chronic pain    DISH (diffuse idiopathic skeletal hyperostosis)    GERD (gastroesophageal reflux disease)    Impaired hearing    Past Surgical History:  Procedure Laterality Date   BACK SURGERY     X2   CATARACT EXTRACTION W/PHACO  12/03/2011   Procedure:  CATARACT EXTRACTION PHACO AND INTRAOCULAR LENS PLACEMENT (IOC);  Surgeon: Gemma Payor;  Location: [facility];  Service: Ophthalmology;  Laterality: Right;  CDE: 7.86   CATARACT EXTRACTION W/PHACO  12/21/2011   Procedure: CATARACT EXTRACTION PHACO AND INTRAOCULAR LENS PLACEMENT (IOC);  Surgeon: Gemma Payor, MD;  Location: [facility];  Service: Ophthalmology;  Laterality: Left;  CDE=5.51   KNEE ARTHROSCOPY W/ ACL RECONSTRUCTION  05/2000   right, [facility]   KNEE SURGERY  10/2000   remove screw after ACL   LAMINECTOMY     TUMOR EXCISION     back, cyst   Social History:  reports that he has never smoked. His smokeless tobacco use includes chew. He reports that he does not drink alcohol or use drugs. Patient lives at home  Allergies  Allergen Reactions   Ceclor [Cefaclor] Other (See Comments)    Keeps patient wake for days.    Nsaids Other (See Comments)    G.I. Upset   Other Other (See Comments)    Steroids make patient extremely agitated     Family History  Problem Relation Age of Onset   Anesthesia problems Neg Hx    Hypotension Neg Hx    Malignant hyperthermia Neg Hx    Pseudochol deficiency Neg Hx       Prior to Admission medications   Medication Sig Start Date End Date Taking? Authorizing Provider  ALPRAZolam Prudy Feeler) 1 MG tablet Take 1.5 mg by mouth at bedtime as needed. Sleep     [provider]  azithromycin [facility])  250 MG tablet Take 1-2 tablets by mouth as directed. Patient takes 2 tablets by mouth on day 1 of treatment; on days 2-5, patient takes 1 tablet daily 04/17/19   [provider]  DULoxetine (CYMBALTA) 60 MG capsule Take 60 mg by mouth 2 (two) times daily.     [provider]  fluticasone (CUTIVATE) 0.05 % cream Apply 1 application topically daily as needed. 12/21/18   [provider]  gabapentin (NEURONTIN) 600 MG tablet Take 600 mg by mouth at bedtime.      [provider]  ketoconazole (NIZORAL) 2 % shampoo Apply 1  application topically daily as needed. 12/16/18   [provider]  lansoprazole (PREVACID) 30 MG capsule Take 30 mg by mouth daily.     [provider]  morphine (MS CONTIN) 15 MG 12 hr tablet Take 15 mg by mouth 2 (two) times daily.      [provider]  morphine (MS CONTIN) 60 MG 12 hr tablet Take 60 mg by mouth 2 (two) times daily.      [provider]  oxyCODONE (OXY IR/ROXICODONE) 5 MG immediate release tablet Take 5 mg by mouth 3 (three) times daily.      [provider]  sildenafil (VIAGRA) 100 MG tablet Take 1 tablet by mouth as directed. 01/17/19   [provider]  temazepam (RESTORIL) 30 MG capsule Take 30 mg by mouth at bedtime as needed. Sleep     [provider]  traZODone (DESYREL) 50 MG tablet Take 1 tablet by mouth at bedtime. 03/15/19   [provider]    Physical Exam: BP 102/77    Pulse 97    Temp 98.9 F (37.2 C) (Oral)    Resp 12    Ht  (1.727 m)    Wt 99.8 kg    SpO2 100%    BMI 33.45 kg/m    General: Elderly Caucasian male. Awake and alert and oriented x3. No acute cardiopulmonary distress.  Patient is writhing in bed in pain.  HEENT: Normocephalic atraumatic.  Right and left ears normal in appearance.  Pupils equal, round, reactive to light. Extraocular muscles are intact. Sclerae anicteric and noninjected.  Moist mucosal membranes. No mucosal lesions.   Neck: Neck supple without lymphadenopathy. No carotid bruits. No masses palpated.   Cardiovascular: Regular rate with normal S1-S2 sounds. No murmurs, rubs, gallops auscultated. No JVD.   Respiratory: Good respiratory effort with no wheezes, rales, rhonchi. Lungs clear to auscultation bilaterally.  No accessory muscle use.  Abdomen: Soft, nontender, nondistended. Active bowel sounds. No masses or hepatosplenomegaly   Skin: Left shoulder slightly swollen.  Dry, warm to touch. 2+ dorsalis pedis and radial pulses.  Musculoskeletal: No calf or  leg pain. All major joints not erythematous nontender.  No upper or lower joint deformation.  Good ROM.  No contractures   Psychiatric: Intact judgment and insight. Pleasant and cooperative.  Neurologic: No focal neurological deficits. Strength is 5/5 and symmetric in upper and lower extremities.  Cranial nerves II through XII are grossly intact.           Labs on Admission: I have personally reviewed following labs and imaging studies  CBC: Recent Labs  Lab 04/19/19 1421  WBC 15.0*  NEUTROABS 12.7*  HGB 14.3  HCT 41.7  MCV 86.0  PLT 309   Basic Metabolic Panel: Recent Labs  Lab 04/19/19 1421  NA 132*  K 3.7  CL 93*  CO2 25  GLUCOSE  132*  BUN 52*  CREATININE 2.52*  CALCIUM 8.4*   GFR: Estimated Creatinine Clearance: 31.7 mL/min (A) (by C-G formula based on SCr of 2.52 mg/dL (H)). Liver Function Tests: Recent Labs  Lab 04/19/19 1421  AST 264*  ALT 107*  ALKPHOS 131*  BILITOT 0.7  PROT 6.7  ALBUMIN 3.1*   No results for input(s): LIPASE, AMYLASE in the last 168 hours. No results for input(s): AMMONIA in the last 168 hours. Coagulation Profile: No results for input(s): INR, PROTIME in the last 168 hours. Cardiac Enzymes: Recent Labs  Lab 04/19/19 1421  CKTOTAL 9,598*   BNP (last 3 results) No results for input(s): PROBNP in the last 8760 hours. HbA1C: No results for input(s): HGBA1C in the last 72 hours. CBG: No results for input(s): GLUCAP in the last 168 hours. Lipid Profile: No results for input(s): CHOL, HDL, LDLCALC, TRIG, CHOLHDL, LDLDIRECT in the last 72 hours. Thyroid Function Tests: No results for input(s): TSH, T4TOTAL, FREET4, T3FREE, THYROIDAB in the last 72 hours. Anemia Panel: No results for input(s): VITAMINB12, FOLATE, FERRITIN, TIBC, IRON, RETICCTPCT in the last 72 hours. Urine analysis:    Component Value Date/Time   COLORURINE YELLOW 07/11/2007 0010   APPEARANCEUR CLEAR 07/11/2007 0010   LABSPEC 1.015 07/11/2007 0010    PHURINE 8.5 (H) 07/11/2007 0010   GLUCOSEU NEGATIVE 07/11/2007 0010   HGBUR TRACE (A) 07/11/2007 0010   BILIRUBINUR NEGATIVE 07/11/2007 0010   KETONESUR NEGATIVE 07/11/2007 0010   PROTEINUR NEGATIVE 07/11/2007 0010   UROBILINOGEN 0.2 07/11/2007 0010   NITRITE NEGATIVE 07/11/2007 0010   LEUKOCYTESUR NEGATIVE 07/11/2007 0010   Sepsis Labs: @LABRCNTIP (procalcitonin:4,lacticidven:4) )No results found for this or any previous visit (from the past 240 hour(s)).   Radiological Exams on Admission: Dg Chest 1 View  Result Date: 04/19/2019 CLINICAL DATA:  Pain status post fall EXAM: CHEST  1 VIEW COMPARISON:  02/16/2012 FINDINGS: The heart size and mediastinal contours are within normal limits. Both lungs are clear. The visualized skeletal structures are unremarkable. IMPRESSION: No active disease. Electronically Signed   By: Katherine Mantlehristopher  Green M.D.   On: 04/19/2019 15:54   Ct Head Wo Contrast  Result Date: 04/19/2019 CLINICAL DATA:  Fall. EXAM: CT HEAD WITHOUT CONTRAST CT CERVICAL SPINE WITHOUT CONTRAST TECHNIQUE: Multidetector CT imaging of the head and cervical spine was performed following the standard protocol without intravenous contrast. Multiplanar CT image reconstructions of the cervical spine were also generated. COMPARISON:  Cervical spine x-rays dated October 07, 2009. FINDINGS: CT HEAD FINDINGS Brain: No evidence of acute infarction, hemorrhage, hydrocephalus, extra-axial collection or mass lesion/mass effect. Vascular: No hyperdense vessel or unexpected calcification. Skull: Normal. Negative for fracture or focal lesion. Sinuses/Orbits: No acute finding. Other: None. CT CERVICAL SPINE FINDINGS Alignment: Straightening of the normal cervical lordosis. No traumatic malalignment. Skull base and vertebrae: No acute fracture. No primary bone lesion or focal pathologic process. Soft tissues and spinal canal: No prevertebral fluid or swelling. No visible canal hematoma. Disc levels: Disc heights  are relatively preserved. Evidence of diffuse idiopathic skeletal hyperostosis. Ankylosed bilateral facet joints at C3-C4 and C4-C5. Ankylosed bilateral uncovertebral joints from C3-C4 through C6-C7. Moderate to severe bilateral facet arthropathy at C2-C3. Upper chest: Negative. Other: None. IMPRESSION: 1.  No acute intracranial abnormality. 2.  No acute cervical spine fracture. Electronically Signed   By: Obie DredgeWilliam T Derry M.D.   On: 04/19/2019 15:28   Ct Cervical Spine Wo Contrast  Result Date: 04/19/2019 CLINICAL DATA:  Fall. EXAM: CT HEAD WITHOUT CONTRAST CT CERVICAL  SPINE WITHOUT CONTRAST TECHNIQUE: Multidetector CT imaging of the head and cervical spine was performed following the standard protocol without intravenous contrast. Multiplanar CT image reconstructions of the cervical spine were also generated. COMPARISON:  Cervical spine x-rays dated October 07, 2009. FINDINGS: CT HEAD FINDINGS Brain: No evidence of acute infarction, hemorrhage, hydrocephalus, extra-axial collection or mass lesion/mass effect. Vascular: No hyperdense vessel or unexpected calcification. Skull: Normal. Negative for fracture or focal lesion. Sinuses/Orbits: No acute finding. Other: None. CT CERVICAL SPINE FINDINGS Alignment: Straightening of the normal cervical lordosis. No traumatic malalignment. Skull base and vertebrae: No acute fracture. No primary bone lesion or focal pathologic process. Soft tissues and spinal canal: No prevertebral fluid or swelling. No visible canal hematoma. Disc levels: Disc heights are relatively preserved. Evidence of diffuse idiopathic skeletal hyperostosis. Ankylosed bilateral facet joints at C3-C4 and C4-C5. Ankylosed bilateral uncovertebral joints from C3-C4 through C6-C7. Moderate to severe bilateral facet arthropathy at C2-C3. Upper chest: Negative. Other: None. IMPRESSION: 1.  No acute intracranial abnormality. 2.  No acute cervical spine fracture. Electronically Signed   By: Obie Dredge M.D.    On: 04/19/2019 15:28   Dg Shoulder Left  Result Date: 04/19/2019 CLINICAL DATA:  Pain status post fall EXAM: LEFT SHOULDER - 2+ VIEW COMPARISON:  None. FINDINGS: There is no acute displaced fracture or dislocation. There are degenerative changes of the glenohumeral and acromioclavicular joints. The osseous mineralization is within normal limits. IMPRESSION: No acute osseous abnormality. Electronically Signed   By: Katherine Mantle M.D.   On: 04/19/2019 15:53   Dg Humerus Left  Result Date: 04/19/2019 CLINICAL DATA:  68 year old male with a history of fall EXAM: LEFT HUMERUS - 2+ VIEW COMPARISON:  None FINDINGS: No acute displaced fracture. Glenohumeral joint appears congruent. No radiopaque foreign body. IMPRESSION: Negative for acute bony abnormality. Electronically Signed   By: Gilmer Mor D.O.   On: 04/19/2019 15:56   Dg Hips Bilat With Pelvis Min 5 Views  Result Date: 04/19/2019 CLINICAL DATA:  Pain status post fall EXAM: DG HIP (WITH OR WITHOUT PELVIS) 5+V BILAT COMPARISON:  None. FINDINGS: There is no acute displaced fracture or dislocation. Mild-to-moderate degenerative changes are noted of both hips. IMPRESSION: No acute osseous abnormality. Electronically Signed   By: Katherine Mantle M.D.   On: 04/19/2019 15:56    Assessment/Plan: Principal Problem:   Rhabdomyolysis Active Problems:   DISH (diffuse idiopathic skeletal hyperostosis)   GERD (gastroesophageal reflux disease)   Chronic pain   AKI (acute kidney injury) (HCC)   Hypocalcemia   Leukocytosis   Elevated LFTs    This patient was discussed with the ED physician, including pertinent vitals, physical exam findings, labs, and imaging.  We also discussed care given by the ED provider.  1. Rhabdomyolysis a. Admit b. Given degree of rhabdomyolysis and the patient, the patient qualifies for full admit as patient will likely need more than 8 hours to correct electrolyte imbalance, and clear creatinine to avoid worsening  kidney injury c. Check CK and creatinine tonight d. Repeat labs in the morning: CBC, CMP, and CK e. We will give the patient other liter of IV fluids and start maintenance fluids at 200 mL's per hour. 2. AKI a. Elevated creatinine is concerning for heme pigment induced AKI. b. Baseline creatinine is 0.7. c. Continue fluid management and monitor creatinine as above 3. Hypocalcemia a. Given patient is hypocalcemic, patient does not qualify for bicarb infusion to aid in renal protection. b. We will continue to watch calcium levels.  We will not replace at this time due to risk of calcium phosphatemia deposits 4. Leukocytosis a. No evidence of infection b. Repeat WBC in the morning 5. Elevated LFTs a. Transaminitis can occur with patient's who have rhabdo. b. We will hold off on acute hepatic panel and watch for improvement with improvement of the patient's rhabdo. 6. Chronic pain a. Patient quite tolerant b. Although the patient has not had his home pain medicine since Tuesday, he reports no symptoms of withdrawal.   c. We will continue his home pain medicine 7. GERD a. Continue PPI 8. DISH  DVT prophylaxis: Lovenox Consultants: None Code Status: Full code Family Communication: None Disposition Plan: Should be able to return home following improvement of patient's AKI and resolution of rhabdomyolysis   Levie Heritage, DO

## 2019-04-19 NOTE — ED Triage Notes (Addendum)
Pt c/o left upper arm/shoulder pain after fall night before last. Pt reports he doesn't remember the fall or how he fell but that he had someone find him in the floor yesterday and helped get him up and into the bed. Pt has swelling and pain to left upper arm. Decreased mobility of left arm. Pt also c/o pain to left side and left buttocks.

## 2019-04-20 ENCOUNTER — Inpatient Hospital Stay (HOSPITAL_COMMUNITY): Payer: Medicare HMO

## 2019-04-20 DIAGNOSIS — R945 Abnormal results of liver function studies: Secondary | ICD-10-CM

## 2019-04-20 LAB — COMPREHENSIVE METABOLIC PANEL
ALT: 112 U/L — ABNORMAL HIGH (ref 0–44)
ALT: 111 U/L — ABNORMAL HIGH (ref 0–44)
AST: 277 U/L — ABNORMAL HIGH (ref 15–41)
AST: 277 U/L — ABNORMAL HIGH (ref 15–41)
Albumin: 2.9 g/dL — ABNORMAL LOW (ref 3.5–5.0)
Albumin: 2.6 g/dL — ABNORMAL LOW (ref 3.5–5.0)
Alkaline Phosphatase: 109 U/L (ref 38–126)
Alkaline Phosphatase: 100 U/L (ref 38–126)
Anion gap: 13 (ref 5–15)
Anion gap: 9 (ref 5–15)
BUN: 39 mg/dL — ABNORMAL HIGH (ref 8–23)
BUN: 32 mg/dL — ABNORMAL HIGH (ref 8–23)
CO2: 27 mmol/L (ref 22–32)
CO2: 25 mmol/L (ref 22–32)
Calcium: 8.3 mg/dL — ABNORMAL LOW (ref 8.9–10.3)
Calcium: 8.2 mg/dL — ABNORMAL LOW (ref 8.9–10.3)
Chloride: 99 mmol/L (ref 98–111)
Chloride: 102 mmol/L (ref 98–111)
Creatinine, Ser: 1.83 mg/dL — ABNORMAL HIGH (ref 0.61–1.24)
Creatinine, Ser: 1.53 mg/dL — ABNORMAL HIGH (ref 0.61–1.24)
GFR calc Af Amer: 43 mL/min — ABNORMAL LOW (ref >60)
GFR calc Af Amer: 53 mL/min — ABNORMAL LOW (ref >60)
GFR calc non Af Amer: 37 mL/min — ABNORMAL LOW (ref >60)
GFR calc non Af Amer: 46 mL/min — ABNORMAL LOW (ref >60)
Glucose, Bld: 113 mg/dL — ABNORMAL HIGH (ref 70–99)
Glucose, Bld: 113 mg/dL — ABNORMAL HIGH (ref 70–99)
Potassium: 3.4 mmol/L — ABNORMAL LOW (ref 3.5–5.1)
Potassium: 3.8 mmol/L (ref 3.5–5.1)
Sodium: 139 mmol/L (ref 135–145)
Sodium: 136 mmol/L (ref 135–145)
Total Bilirubin: 0.7 mg/dL (ref 0.3–1.2)
Total Bilirubin: 0.7 mg/dL (ref 0.3–1.2)
Total Protein: 6.5 g/dL (ref 6.5–8.1)
Total Protein: 5.9 g/dL — ABNORMAL LOW (ref 6.5–8.1)

## 2019-04-20 LAB — CBC
HCT: 40.4 % (ref 39.0–52.0)
Hemoglobin: 13.1 g/dL (ref 13.0–17.0)
MCH: 29.3 pg (ref 26.0–34.0)
MCHC: 32.4 g/dL (ref 30.0–36.0)
MCV: 90.4 fL (ref 80.0–100.0)
Platelets: 250 10*3/uL (ref 150–400)
RBC: 4.47 MIL/uL (ref 4.22–5.81)
RDW: 13.3 % (ref 11.5–15.5)
WBC: 8.5 10*3/uL (ref 4.0–10.5)
nRBC: 0 % (ref 0.0–0.2)

## 2019-04-20 LAB — CK: Total CK: 9086 U/L — ABNORMAL HIGH (ref 49–397)

## 2019-04-20 LAB — MAGNESIUM: Magnesium: 2 mg/dL (ref 1.7–2.4)

## 2019-04-20 MED ORDER — HYDROMORPHONE HCL 1 MG/ML IJ SOLN
1.0000 mg | Freq: Four times a day (QID) | INTRAMUSCULAR | Status: DC | PRN
  Administered 2019-04-20 – 2019-04-22 (×2): 1 mg via INTRAVENOUS
  Filled 2019-04-20 (×3): qty 1

## 2019-04-20 MED ORDER — OXYCODONE HCL 5 MG PO TABS
5.0000 mg | ORAL_TABLET | ORAL | Status: DC | PRN
  Administered 2019-04-20: 5 mg via ORAL
  Filled 2019-04-20: qty 1

## 2019-04-20 MED ORDER — LIDOCAINE 5 % EX PTCH
1.0000 | MEDICATED_PATCH | CUTANEOUS | Status: DC
  Administered 2019-04-20 – 2019-04-26 (×7): 1 via TRANSDERMAL
  Filled 2019-04-20 (×11): qty 1

## 2019-04-20 MED ORDER — POTASSIUM CHLORIDE CRYS ER 20 MEQ PO TBCR
40.0000 meq | EXTENDED_RELEASE_TABLET | Freq: Once | ORAL | Status: AC
  Administered 2019-04-20: 09:00:00 40 meq via ORAL
  Filled 2019-04-20: qty 2

## 2019-04-20 MED ORDER — POLYETHYLENE GLYCOL 3350 17 G PO PACK
17.0000 g | PACK | Freq: Every day | ORAL | Status: DC
  Administered 2019-04-20 – 2019-04-25 (×6): 17 g via ORAL
  Filled 2019-04-20 (×6): qty 1

## 2019-04-20 NOTE — Evaluation (Signed)
Physical Therapy Evaluation Patient Details Name: [name] MRN: [id number] DOB: 25-Jun-1951 Today's Date: 04/20/2019   History of Present Illness  [name] is a 68 y.o. male with a history of diffuse idiopathic skeletal hyperostosis (DISH), chronic pain management, GERD, impaired hearing.  Patient seen after a fall Monday night.  Patient does not recall the fall but woke up on the floor in the morning.  He laid on the floor until approximately noon Tuesday, when a neighbor checked on him and helped him to the bed.  Patient laid in bed for the next 24 hours attempting to recover, however continue to mount increasing pain.  Pain mostly in the patient's back, left buttock, left shoulder.  Pain is nonradiating and is severe.  No palliating or provoking factors.  Patient has not been taking his pain medication since the fall.    Clinical Impression  Patient has difficulty with sitting up at bedside mostly due limited use of LUE secondary to increased pain with movement/pressure.  Patient has frequent shaky movement of extremities, states that is baseline for him, slightly unsteady slow labored cadence without loss of balance and limited secondary to fatigue.  Patient tolerated sitting up in chair after therapy - RN aware.  Patient will benefit from continued physical therapy in hospital and recommended venue below to increase strength, balance, endurance for safe ADLs and gait.    Follow Up Recommendations SNF;Supervision for mobility/OOB;Supervision - Intermittent    Equipment Recommendations  None recommended by PT    Recommendations for Other Services       Precautions / Restrictions Precautions Precautions: Fall Restrictions Weight Bearing Restrictions: No      Mobility  Bed Mobility Overal bed mobility: Needs Assistance Bed Mobility: Supine to Sit     Supine to sit: Min assist;Mod assist     General bed mobility comments: slow labored movement with difficulty using LUE  due to pain  Transfers Overall transfer level: Needs assistance Equipment used: Rolling walker (2 wheeled) Transfers: Sit to/from [employer] Sit to Stand: Min guard;Min assist Stand pivot transfers: Min guard;Min assist       General transfer comment: slow unsteady labored movement  Ambulation/Gait Ambulation/Gait assistance: Min guard;Min assist Gait Distance (Feet): 55 Feet Assistive device: Rolling walker (2 wheeled) Gait Pattern/deviations: Decreased step length - right;Decreased step length - left;Decreased stride length Gait velocity: decreased   General Gait Details: slightly labored cadence with wide base of support and shakiness, limited secondary to fatigue, no loss of balance  Stairs            Wheelchair Mobility    Modified Rankin (Stroke Patients Only)       Balance Overall balance assessment: Needs assistance Sitting-balance support: Feet supported;No upper extremity supported Sitting balance-Leahy Scale: Fair     Standing balance support: During functional activity;No upper extremity supported Standing balance-Leahy Scale: Fair Standing balance comment: fair/poor without AD, fair using RW                             Pertinent Vitals/Pain Pain Assessment: 0-10 Pain Score: 5  Pain Location: low back and LUE  Pain Descriptors / Indicators: Sore;Grimacing;Guarding Pain Intervention(s): Limited activity within patient's tolerance;Monitored during session    Home Living Family/patient expects to be discharged to:: Private residence Living Arrangements: Alone Available Help at Discharge: Friend(s) Type of Home: House Home Access: Ramped entrance     Home Layout: One level Home Equipment:  Walker - 4 wheels;Crutches;Wheelchair - Manufacturing systems engineermanual;Shower seat;Other (comment) Additional Comments: has an adjustable bed    Prior Function Level of Independence: Independent with assistive device(s)         Comments: Household  and short distanced Scientist, research (medical)community ambulator using Rollator     Hand Dominance        Extremity/Trunk Assessment   Upper Extremity Assessment Upper Extremity Assessment: Generalized weakness;RUE deficits/detail;LUE deficits/detail RUE Deficits / Details: grossly 4+/5 LUE Deficits / Details: grossly 3+/5 LUE: Unable to fully assess due to pain    Lower Extremity Assessment Lower Extremity Assessment: Generalized weakness    Cervical / Trunk Assessment Cervical / Trunk Assessment: Kyphotic  Communication   Communication: No difficulties  Cognition Arousal/Alertness: Awake/alert Behavior During Therapy: WFL for tasks assessed/performed Overall Cognitive Status: Within Functional Limits for tasks assessed                                        General Comments      Exercises     Assessment/Plan    PT Assessment Patient needs continued PT services  PT Problem List Decreased strength;Decreased activity tolerance;Decreased balance;Decreased mobility       PT Treatment Interventions Therapeutic exercise;Gait training;Stair training;Functional mobility training;Therapeutic activities;Patient/family education    PT Goals (Current goals can be found in the Care Plan section)  Acute Rehab PT Goals Patient Stated Goal: return home with friends to assist PT Goal Formulation: With patient Time For Goal Achievement: 05/04/19 Potential to Achieve Goals: Good    Frequency Min 3X/week   Barriers to discharge        Co-evaluation               AM-PAC PT "6 Clicks" Mobility  Outcome Measure Help needed turning from your back to your side while in a flat bed without using bedrails?: A Little Help needed moving from lying on your back to sitting on the side of a flat bed without using bedrails?: A Lot Help needed moving to and from a bed to a chair (including a wheelchair)?: A Little Help needed standing up from a chair using your arms (e.g., wheelchair or  bedside chair)?: A Little Help needed to walk in hospital room?: A Little Help needed climbing 3-5 steps with a railing? : A Lot 6 Click Score: 16    End of Session Equipment Utilized During Treatment: Gait belt Activity Tolerance: Patient tolerated treatment well;Patient limited by fatigue Patient left: in chair;with call bell/phone within reach;with chair alarm set Nurse Communication: Mobility status PT Visit Diagnosis: Unsteadiness on feet (R26.81);Other abnormalities of gait and mobility (R26.89);Muscle weakness (generalized) (M62.81)    Time: [id number] PT Time Calculation (min) (ACUTE ONLY): 34 min   Charges:   PT Evaluation $PT Eval Moderate Complexity: 1 Mod PT Treatments $Therapeutic Activity: 23-37 mins        11:18 AM, 04/20/19 Ocie BobJames Buford Bremer, MPT Physical Therapist with [facility] [phone] office [phone] mobile phone

## 2019-04-20 NOTE — Plan of Care (Signed)
  Problem: Acute Rehab PT Goals(only PT should resolve) Goal: Pt Will Go Supine/Side To Sit Outcome: Progressing Flowsheets (Taken 04/20/2019 1120) Pt will go Supine/Side to Sit: with supervision Goal: Patient Will Transfer Sit To/From Stand Outcome: Progressing Flowsheets (Taken 04/20/2019 1120) Patient will transfer sit to/from stand: with supervision; with min guard assist Goal: Pt Will Transfer Bed To Chair/Chair To Bed Outcome: Progressing Flowsheets (Taken 04/20/2019 1120) Pt will Transfer Bed to Chair/Chair to Bed: with supervision; min guard assist Goal: Pt Will Ambulate Outcome: Progressing Flowsheets (Taken 04/20/2019 1120) Pt will Ambulate: 75 feet; with supervision; with min guard assist; with rolling walker   11:20 AM, 04/20/19 Ocie Bob, MPT Physical Therapist with [facility] [phone] office [phone] mobile phone

## 2019-04-20 NOTE — Progress Notes (Addendum)
PROGRESS NOTE    [name]   PPG:[id number]  DOB: [birth year]  DOA: 04/19/2019 PCP: Gareth Morgan, MD   Brief Narrative:  [name] is a 68 y.o. male with a history of diffuse idiopathic skeletal hyperostosis (DISH), chronic pain management, GERD, impaired hearing. He presents after being found on the floor. The patient states that the room he was found in is not a room that he routinely goes into and he does not recall walking into this room. He was laying on his left side when he was found and was noted to have severe left arm swelling and pain in the ED along with difficulty moving the arm. He was also noted to have rhabdomyolysis and AKI.    Subjective: He continues to have pain in his left arm and in the mid lower back. He fell about 3 wks ago and has had pain in the center of his lower back since the fall.     Assessment & Plan:   Principal Problem:   Rhabdomyolysis/ elevated LFTs - likely due to fall and trauma to his left arm and leg hip - cont IVF and follow  Active Problems: Syncope - cause is undetermined- he does not recall even entering the room where he was found on the floor- he states he had not taken too much pain medications - head CT is unrevealing- I would like and MRI but with his current back pain and left arm pain, this may be difficult- will order MRI and ensure he has pain medication before he goes down for it.   Trauma to left arm - significant left arm swelling due to laying on the arm - continue conservative management- no signs of compartment syndrome   Lower back pain after a fall 3 wks ago - has midline lumbar pain without radiation to his legs - xray of L spine > Stable lucency at T12-L1 - will obtain an MRI of L spine   AKI (acute kidney injury)   - due to rhabdomyolysis vs dehydration follow  Hypokalemia - replacing    DISH (diffuse idiopathic skeletal hyperostosis)   Chronic pain - cont narcotics for pain> Neurotntin, MS Contin,  Oxycodone, Cymbalta -  MS Contin has been doubled due to acute pain from his fall  Time spent in minutes: 35 DVT prophylaxis: Lovenox Code Status: Full code Family Communication:  Disposition Plan: likely will need to go to Rehab- PT eval ordered Consultants:   none Procedures:   none Antimicrobials:  Anti-infectives (From admission, onward)   None       Objective: Vitals:   04/19/19 1700 04/19/19 1810 04/19/19 1955 04/20/19 0525  BP: (!) 155/69 108/89 111/60 104/68  Pulse: (!) 44 (!) 102 (!) 106 87  Resp: 15 14 17 16   Temp:   99.1 F (37.3 C) 98.1 F (36.7 C)  TempSrc:   Oral Oral  SpO2: 100% 99% 92%   Weight:      Height:        Intake/Output Summary (Last 24 hours) at 04/20/2019 1158 Last data filed at 04/20/2019 0940 Gross per 24 hour  Intake 2900 ml  Output 1550 ml  Net 1350 ml   Filed Weights   04/19/19 1326  Weight: 99.8 kg    Examination: General exam: Appears comfortable  HEENT: PERRLA, oral mucosa moist, no sclera icterus or thrush Respiratory system: Clear to auscultation. Respiratory effort normal. Cardiovascular system: S1 & S2 heard, RRR.   Gastrointestinal system: Abdomen soft, non-tender, nondistended.  Normal bowel sounds. Central nervous system: Alert and oriented. No focal neurological deficits. Extremities: No cyanosis, clubbing - severe swelling and tenderness in left arm Skin: No rashes or ulcers Psychiatry:  Mood & affect appropriate.     Data Reviewed: I have personally reviewed following labs and imaging studies  CBC: Recent Labs  Lab 04/19/19 1421 04/20/19 0600  WBC 15.0* 8.5  NEUTROABS 12.7*  --   HGB 14.3 13.1  HCT 41.7 40.4  MCV 86.0 90.4  PLT 309 250   Basic Metabolic Panel: Recent Labs  Lab 04/19/19 1421 04/19/19 2011 04/20/19 0600 04/20/19 0815  NA 132*  --  139  --   K 3.7  --  3.4*  --   CL 93*  --  99  --   CO2 25  --  27  --   GLUCOSE 132*  --  113*  --   BUN 52*  --  39*  --   CREATININE 2.52*  2.20* 1.83*  --   CALCIUM 8.4*  --  8.3*  --   MG  --   --   --  2.0   GFR: Estimated Creatinine Clearance: 43.6 mL/min (A) (by C-G formula based on SCr of 1.83 mg/dL (H)). Liver Function Tests: Recent Labs  Lab 04/19/19 1421 04/20/19 0600  AST 264* 277*  ALT 107* 112*  ALKPHOS 131* 109  BILITOT 0.7 0.7  PROT 6.7 6.5  ALBUMIN 3.1* 2.9*   No results for input(s): LIPASE, AMYLASE in the last 168 hours. No results for input(s): AMMONIA in the last 168 hours. Coagulation Profile: No results for input(s): INR, PROTIME in the last 168 hours. Cardiac Enzymes: Recent Labs  Lab 04/19/19 1421 04/19/19 2011 04/20/19 0600  CKTOTAL 9,598* 9,702* 9,086*   BNP (last 3 results) No results for input(s): PROBNP in the last 8760 hours. HbA1C: No results for input(s): HGBA1C in the last 72 hours. CBG: No results for input(s): GLUCAP in the last 168 hours. Lipid Profile: No results for input(s): CHOL, HDL, LDLCALC, TRIG, CHOLHDL, LDLDIRECT in the last 72 hours. Thyroid Function Tests: No results for input(s): TSH, T4TOTAL, FREET4, T3FREE, THYROIDAB in the last 72 hours. Anemia Panel: No results for input(s): VITAMINB12, FOLATE, FERRITIN, TIBC, IRON, RETICCTPCT in the last 72 hours. Urine analysis:    Component Value Date/Time   COLORURINE YELLOW 07/11/2007 0010   APPEARANCEUR CLEAR 07/11/2007 0010   LABSPEC 1.015 07/11/2007 0010   PHURINE 8.5 (H) 07/11/2007 0010   GLUCOSEU NEGATIVE 07/11/2007 0010   HGBUR TRACE (A) 07/11/2007 0010   BILIRUBINUR NEGATIVE 07/11/2007 0010   KETONESUR NEGATIVE 07/11/2007 0010   PROTEINUR NEGATIVE 07/11/2007 0010   UROBILINOGEN 0.2 07/11/2007 0010   NITRITE NEGATIVE 07/11/2007 0010   LEUKOCYTESUR NEGATIVE 07/11/2007 0010   Sepsis Labs: (procalcitonin:4,lacticidven:4) ) Recent Results (from the past 240 hour(s))  SARS Coronavirus 2 (CEPHEID - Performed in [facility] hospital lab), Hosp Order     Status: None   Collection Time: 04/19/19   4:26 PM  Result Value Ref Range Status   SARS Coronavirus 2 NEGATIVE NEGATIVE Final    Comment: (NOTE) If result is NEGATIVE SARS-CoV-2 target nucleic acids are NOT DETECTED. The SARS-CoV-2 RNA is generally detectable in upper and lower  respiratory specimens during the acute phase of infection. The lowest  concentration of SARS-CoV-2 viral copies this assay can detect is 250  copies / mL. A negative result does not preclude SARS-CoV-2 infection  and should not be used as the sole basis  for treatment or other  patient management decisions.  A negative result may occur with  improper specimen collection / handling, submission of specimen other  than nasopharyngeal swab, presence of viral mutation(s) within the  areas targeted by this assay, and inadequate number of viral copies  (<250 copies / mL). A negative result must be combined with clinical  observations, patient history, and epidemiological information. If result is POSITIVE SARS-CoV-2 target nucleic acids are DETECTED. The SARS-CoV-2 RNA is generally detectable in upper and lower  respiratory specimens dur ing the acute phase of infection.  Positive  results are indicative of active infection with SARS-CoV-2.  Clinical  correlation with patient history and other diagnostic information is  necessary to determine patient infection status.  Positive results do  not rule out bacterial infection or co-infection with other viruses. If result is PRESUMPTIVE POSTIVE SARS-CoV-2 nucleic acids MAY BE PRESENT.   A presumptive positive result was obtained on the submitted specimen  and confirmed on repeat testing.  While 2019 novel coronavirus  (SARS-CoV-2) nucleic acids may be present in the submitted sample  additional confirmatory testing may be necessary for epidemiological  and / or clinical management purposes  to differentiate between  SARS-CoV-2 and other Sarbecovirus currently known to infect humans.  If clinically indicated  additional testing with an alternate test  methodology [phone]) is advised. The SARS-CoV-2 RNA is generally  detectable in upper and lower respiratory sp ecimens during the acute  phase of infection. The expected result is Negative. Fact Sheet for Patients:  BoilerBrush.com.cy Fact Sheet for Healthcare Providers: https://pope.com/ This test is not yet approved or cleared by the [place] FDA and has been authorized for detection and/or diagnosis of SARS-CoV-2 by FDA under an Emergency Use Authorization (EUA).  This EUA will remain in effect (meaning this test can be used) for the duration of the COVID-19 declaration under Section 564(b)(1) of the Act, 21 U.S.C. section 360bbb-3(b)(1), unless the authorization is terminated or revoked sooner. Performed at [facility], [address] [place], [place] [address]          Radiology Studies: Dg Chest 1 View  Result Date: 04/19/2019 CLINICAL DATA:  Pain status post fall EXAM: CHEST  1 VIEW COMPARISON:  02/16/2012 FINDINGS: The heart size and mediastinal contours are within normal limits. Both lungs are clear. The visualized skeletal structures are unremarkable. IMPRESSION: No active disease. Electronically Signed   By: Katherine Mantle M.D.   On: 04/19/2019 15:54   Dg Lumbar Spine Complete  Result Date: 04/20/2019 CLINICAL DATA:  Fall several days ago with back pain, initial encounter EXAM: LUMBAR SPINE - COMPLETE 4+ VIEW COMPARISON:  04/14/2019 FINDINGS: Vertebral body height is well maintained. Multilevel osteophytic changes are noted. No anterolisthesis is seen. Disc space narrowing is noted with the exception of L5-S1. Previously seen lucency at T12-L1 is again noted and stable. No new focal abnormality is noted. IMPRESSION: Stable lucency at T12-L1. This again would be better evaluated with CT as previously described. No new focal abnormality is seen. Electronically Signed   By: Alcide Clever M.D.   On: 04/20/2019 11:53   Ct Head Wo Contrast  Result Date: 04/19/2019 CLINICAL DATA:  Fall. EXAM: CT HEAD WITHOUT CONTRAST CT CERVICAL SPINE WITHOUT CONTRAST TECHNIQUE: Multidetector CT imaging of the head and cervical spine was performed following the standard protocol without intravenous contrast. Multiplanar CT image reconstructions of the cervical spine were also generated. COMPARISON:  Cervical spine x-rays dated October 07, 2009. FINDINGS: CT  HEAD FINDINGS Brain: No evidence of acute infarction, hemorrhage, hydrocephalus, extra-axial collection or mass lesion/mass effect. Vascular: No hyperdense vessel or unexpected calcification. Skull: Normal. Negative for fracture or focal lesion. Sinuses/Orbits: No acute finding. Other: None. CT CERVICAL SPINE FINDINGS Alignment: Straightening of the normal cervical lordosis. No traumatic malalignment. Skull base and vertebrae: No acute fracture. No primary bone lesion or focal pathologic process. Soft tissues and spinal canal: No prevertebral fluid or swelling. No visible canal hematoma. Disc levels: Disc heights are relatively preserved. Evidence of diffuse idiopathic skeletal hyperostosis. Ankylosed bilateral facet joints at C3-C4 and C4-C5. Ankylosed bilateral uncovertebral joints from C3-C4 through C6-C7. Moderate to severe bilateral facet arthropathy at C2-C3. Upper chest: Negative. Other: None. IMPRESSION: 1.  No acute intracranial abnormality. 2.  No acute cervical spine fracture. Electronically Signed   By: Obie Dredge M.D.   On: 04/19/2019 15:28   Ct Cervical Spine Wo Contrast  Result Date: 04/19/2019 CLINICAL DATA:  Fall. EXAM: CT HEAD WITHOUT CONTRAST CT CERVICAL SPINE WITHOUT CONTRAST TECHNIQUE: Multidetector CT imaging of the head and cervical spine was performed following the standard protocol without intravenous contrast. Multiplanar CT image reconstructions of the cervical spine were also generated. COMPARISON:  Cervical spine  x-rays dated October 07, 2009. FINDINGS: CT HEAD FINDINGS Brain: No evidence of acute infarction, hemorrhage, hydrocephalus, extra-axial collection or mass lesion/mass effect. Vascular: No hyperdense vessel or unexpected calcification. Skull: Normal. Negative for fracture or focal lesion. Sinuses/Orbits: No acute finding. Other: None. CT CERVICAL SPINE FINDINGS Alignment: Straightening of the normal cervical lordosis. No traumatic malalignment. Skull base and vertebrae: No acute fracture. No primary bone lesion or focal pathologic process. Soft tissues and spinal canal: No prevertebral fluid or swelling. No visible canal hematoma. Disc levels: Disc heights are relatively preserved. Evidence of diffuse idiopathic skeletal hyperostosis. Ankylosed bilateral facet joints at C3-C4 and C4-C5. Ankylosed bilateral uncovertebral joints from C3-C4 through C6-C7. Moderate to severe bilateral facet arthropathy at C2-C3. Upper chest: Negative. Other: None. IMPRESSION: 1.  No acute intracranial abnormality. 2.  No acute cervical spine fracture. Electronically Signed   By: Obie Dredge M.D.   On: 04/19/2019 15:28   Dg Shoulder Left  Result Date: 04/19/2019 CLINICAL DATA:  Pain status post fall EXAM: LEFT SHOULDER - 2+ VIEW COMPARISON:  None. FINDINGS: There is no acute displaced fracture or dislocation. There are degenerative changes of the glenohumeral and acromioclavicular joints. The osseous mineralization is within normal limits. IMPRESSION: No acute osseous abnormality. Electronically Signed   By: Katherine Mantle M.D.   On: 04/19/2019 15:53   Dg Humerus Left  Result Date: 04/19/2019 CLINICAL DATA:  68 year old male with a history of fall EXAM: LEFT HUMERUS - 2+ VIEW COMPARISON:  None FINDINGS: No acute displaced fracture. Glenohumeral joint appears congruent. No radiopaque foreign body. IMPRESSION: Negative for acute bony abnormality. Electronically Signed   By: Gilmer Mor D.O.   On: 04/19/2019 15:56   Dg  Hips Bilat With Pelvis Min 5 Views  Result Date: 04/19/2019 CLINICAL DATA:  Pain status post fall EXAM: DG HIP (WITH OR WITHOUT PELVIS) 5+V BILAT COMPARISON:  None. FINDINGS: There is no acute displaced fracture or dislocation. Mild-to-moderate degenerative changes are noted of both hips. IMPRESSION: No acute osseous abnormality. Electronically Signed   By: Katherine Mantle M.D.   On: 04/19/2019 15:56      Scheduled Meds:  DULoxetine  60 mg Oral BID   enoxaparin (LOVENOX) injection  50 mg Subcutaneous Q24H   gabapentin  600 mg Oral QHS  lidocaine  1 patch Transdermal Q24H   morphine  30 mg Oral Q12H   pantoprazole  40 mg Oral Daily   polyethylene glycol  17 g Oral Daily   traZODone  50 mg Oral QHS   Continuous Infusions:  sodium chloride 125 mL/hr at 04/20/19 1041     LOS: 1 day      Calvert CantorSaima Endiya Klahr, MD [facility] Pager: [email or web address] Password [email or web address] 04/20/2019, 11:58 AM

## 2019-04-20 NOTE — TOC Initial Note (Signed)
Transition of Care [facility](TOC) - Initial/Assessment Note    Patient Details  Name: [name] MRN: [id number] Date of Birth: [birth year]  Transition of Care [facility](TOC) CM/SW Contact:    Annice NeedySettle, Cung Masterson D, LCSW Phone Number: 04/20/2019, 2:24 PM  Clinical Narrative:                 PT evaluation and recommendations were discussed. Patient feels like he can manage at home. [facility] (Attending notified of PT, RN, aide home health need at d/c). Patient is independent in his ADLs and drives.  He has a ramp leading up to his home. In the home he has a shower chair, WC, and rollator.   Expected Discharge Plan: Home w Home Health Services Barriers to Discharge: No Barriers Identified   Patient Goals and CMS Choice   CMS Medicare.gov Compare Post Acute Care list provided to:: Patient Choice offered to / list presented to : Patient  Expected Discharge Plan and Services Expected Discharge Plan: Home w Home Health Services In-house Referral: Clinical Social Work Discharge Planning Services: CM Consult Post Acute Care Choice: Home Health Living arrangements for the past 2 months: Single Family Home                           HH Arranged: RN, PT, Nurse's Aide HH Agency: [facility] Date HH Agency Contacted: 04/20/19 Time HH Agency Contacted: 1409 [facility] Agency: Bonita QuinLinda  Prior Living Arrangements/Services Living arrangements for the past 2 months: Single Family Home Lives with:: Self Patient language and need for interpreter reviewed:: Yes Do you feel safe going back to the place where you live?: Yes      Need for Family Participation in Patient Care: Yes (Comment) Care giver support system in place?: Yes (comment) Current home services: DME Criminal Activity/Legal Involvement Pertinent to Current Situation/Hospitalization: No - Comment as needed  Activities of Daily Living Home Assistive Devices/Equipment: Environmental consultantWalker (specify  type)(rollator) ADL Screening (condition at time of admission) Patient's cognitive ability adequate to safely complete daily activities?: Yes Is the patient deaf or have difficulty hearing?: No Does the patient have difficulty seeing, even when wearing glasses/contacts?: No Does the patient have difficulty concentrating, remembering, or making decisions?: No Patient able to express need for assistance with ADLs?: Yes Does the patient have difficulty dressing or bathing?: No Independently performs ADLs?: Yes (appropriate for developmental age) Does the patient have difficulty walking or climbing stairs?: Yes Weakness of Legs: Both(painful left shoulder ) Weakness of Arms/Hands: Left(painful left shoulder )  Permission Sought/Granted                  Emotional Assessment Appearance:: Appears stated age   Affect (typically observed): Appropriate Orientation: : Oriented to Self, Oriented to Place, Oriented to Situation, Oriented to  Time Alcohol / Substance Use: Not Applicable Psych Involvement: No (comment)  Admission diagnosis:  Fall [W19.XXXA] Musculoskeletal pain [M79.18] Acute renal injury (HCC) [N17.9] Fall, initial encounter [W19.XXXA] Traumatic rhabdomyolysis, initial encounter (HCC) [T79.6XXA] Patient Active Problem List   Diagnosis Date Noted  . Rhabdomyolysis 04/19/2019  . AKI (acute kidney injury) (HCC) 04/19/2019  . Hypocalcemia 04/19/2019  . Leukocytosis 04/19/2019  . Elevated LFTs 04/19/2019  . DISH (diffuse idiopathic skeletal hyperostosis)   . GERD (gastroesophageal reflux disease)   . Impaired hearing   . Chronic pain    PCP:  Gareth MorganKnowlton, Steve, MD [facility]  [place],  - [address] Jerolyn Shin [place] [address] Phone: [phone] Fax: [phone]     Social Determinants of Health (SDOH) Interventions    Readmission Risk Interventions No flowsheet data found.

## 2019-04-21 DIAGNOSIS — T796XXA Traumatic ischemia of muscle, initial encounter: Secondary | ICD-10-CM

## 2019-04-21 LAB — CK: Total CK: 5742 U/L — ABNORMAL HIGH (ref 49–397)

## 2019-04-21 MED ORDER — MORPHINE SULFATE ER 15 MG PO TBCR
15.0000 mg | EXTENDED_RELEASE_TABLET | Freq: Two times a day (BID) | ORAL | Status: DC
  Administered 2019-04-21 – 2019-04-23 (×4): 15 mg via ORAL
  Filled 2019-04-21 (×4): qty 1

## 2019-04-21 MED ORDER — TEMAZEPAM 15 MG PO CAPS
15.0000 mg | ORAL_CAPSULE | Freq: Every evening | ORAL | Status: DC | PRN
  Administered 2019-04-21 – 2019-04-25 (×2): 15 mg via ORAL
  Filled 2019-04-21 (×2): qty 1

## 2019-04-21 NOTE — TOC Transition Note (Signed)
Transition of Care [facility]) - CM/SW Discharge Note   Patient Details  Name: [name] MRN: [id number] Date of Birth: 1951/03/17  [facility] CM/SW Contact:  Annice Needy, LCSW Phone Number: 04/21/2019, 3:21 PM   Clinical Narrative:    Patient scheduled to discharge with [facility] (RN,PT, aide) and hospital bed 04/22/2019.      Barriers to Discharge: No Barriers Identified   Patient Goals and CMS Choice   CMS Medicare.gov Compare Post Acute Care list provided to:: Patient Choice offered to / list presented to : Patient  Discharge Placement                       Discharge Plan and Services In-house Referral: Clinical Social Work Discharge Planning Services: CM Consult Post Acute Care Choice: Home Health          DME Arranged: Hospital bed DME Agency: [facility] Date DME Agency Contacted: 04/21/19 Time DME Agency Contacted: 1520 Representative spoke with at DME Agency: Therisa Doyne  HH Arranged: RN, PT, Nurse's Aide HH Agency: [facility] Date HH Agency Contacted: 04/20/19 Time HH Agency Contacted: 1409 Representative spoke with at [facility] Agency: Bonita Quin  Social Determinants of Health (SDOH) Interventions     Readmission Risk Interventions No flowsheet data found.

## 2019-04-21 NOTE — Progress Notes (Signed)
PROGRESS NOTE    [name]   OZD:[id number]  DOB: 05-Feb-1951  DOA: 04/19/2019 PCP: Gareth Morgan, MD   Brief Narrative:  [name] is a 68 y.o. male with a history of diffuse idiopathic skeletal hyperostosis (DISH), chronic pain management, GERD, impaired hearing. He presents after being found on the floor. The patient states that the room he was found in is not a room that he routinely goes into and he does not recall walking into this room. He was laying on his left side when he was found and was noted to have severe left arm swelling and pain in the ED along with difficulty moving the arm. He was also noted to have rhabdomyolysis and AKI.    Subjective: Very sleepy this AM. He has no complaints today.     Assessment & Plan:   Principal Problem:   Rhabdomyolysis/ elevated LFTs - likely due to fall and trauma to his left arm and leg hip - CK is improving- LFTs unchanged- will need to cont IVF for at least another 24 hrs  Active Problems: Syncope - cause is undetermined- he does not recall even entering the room where he was found on the floor- he states he had not taken too much pain medications - head CT is unrevealing- I did order an MRI however, he was too big for the MRI machine.  Trauma to left arm - significant left arm swelling due to laying on the arm - continue conservative management- no signs of compartment syndrome   Lower back pain after a fall 3 wks ago - has midline lumbar pain without radiation to his legs - xray of L spine > Stable lucency at T12-L1 - Unable to obtain MRI as mentioned above - cont pain control- he is able to ambulate and per PT, can go home with HHPT   AKI (acute kidney injury)   - due to rhabdomyolysis vs dehydration  - is improving- cont IVF  Hypokalemia - replaced    DISH (diffuse idiopathic skeletal hyperostosis)   Chronic pain - cont narcotics for pain> Neurotntin, MS Contin, Oxycodone, Cymbalta -    Time spent in  minutes: 35 DVT prophylaxis: Lovenox Code Status: Full code Family Communication:  Disposition Plan: likely home tomorrow Consultants:   none Procedures:   none Antimicrobials:  Anti-infectives (From admission, onward)   None       Objective: Vitals:   04/20/19 0525 04/20/19 1319 04/20/19 2120 04/21/19 0524  BP: 104/68 (!) 110/53 131/67 (!) 154/89  Pulse: 87 87 87 (!) 58  Resp: Temp: 98.1 F (36.7 C) 98.1 F (36.7 C) 98.5 F (36.9 C) 98.7 F (37.1 C)  TempSrc: Oral Oral Oral Oral  SpO2:  95% 98% 91%  Weight:      Height:        Intake/Output Summary (Last 24 hours) at 04/21/2019 1030 Last data filed at 04/21/2019 0900 Gross per 24 hour  Intake 3836.25 ml  Output 2350 ml  Net 1486.25 ml   Filed Weights   04/19/19 1326  Weight: 99.8 kg    Examination: General exam: Appears comfortable  HEENT: PERRLA, oral mucosa moist, no sclera icterus or thrush Respiratory system: Clear to auscultation. Respiratory effort normal. Cardiovascular system: S1 & S2 heard,  No murmurs  Gastrointestinal system: Abdomen soft, non-tender, nondistended. Normal bowel sounds   Central nervous system: Alert and oriented. No focal neurological deficits. Extremities: No cyanosis, clubbing - left arm quite swollen and  tender Skin: No rashes or ulcers Psychiatry:  Mood & affect appropriate.      Data Reviewed: I have personally reviewed following labs and imaging studies  CBC: Recent Labs  Lab 04/19/19 1421 04/20/19 0600  WBC 15.0* 8.5  NEUTROABS 12.7*  --   HGB 14.3 13.1  HCT 41.7 40.4  MCV 86.0 90.4  PLT 309 250   Basic Metabolic Panel: Recent Labs  Lab 04/19/19 1421 04/19/19 2011 04/20/19 0600 04/20/19 0815 04/20/19 1322  NA 132*  --  139  --  136  K 3.7  --  3.4*  --  3.8  CL 93*  --  99  --  102  CO2 25  --  27  --  25  GLUCOSE 132*  --  113*  --  113*  BUN 52*  --  39*  --  32*  CREATININE 2.52* 2.20* 1.83*  --  1.53*  CALCIUM 8.4*  --  8.3*  --   8.2*  MG  --   --   --  2.0  --    GFR: Estimated Creatinine Clearance: 52.2 mL/min (A) (by C-G formula based on SCr of 1.53 mg/dL (H)). Liver Function Tests: Recent Labs  Lab 04/19/19 1421 04/20/19 0600 04/20/19 1322  AST 264* 277* 277*  ALT 107* 112* 111*  ALKPHOS 131* 109 100  BILITOT 0.7 0.7 0.7  PROT 6.7 6.5 5.9*  ALBUMIN 3.1* 2.9* 2.6*   No results for input(s): LIPASE, AMYLASE in the last 168 hours. No results for input(s): AMMONIA in the last 168 hours. Coagulation Profile: No results for input(s): INR, PROTIME in the last 168 hours. Cardiac Enzymes: Recent Labs  Lab 04/19/19 1421 04/19/19 2011 04/20/19 0600 04/21/19 0431  CKTOTAL 9,598* 9,702* 9,086* 5,742*   BNP (last 3 results) No results for input(s): PROBNP in the last 8760 hours. HbA1C: No results for input(s): HGBA1C in the last 72 hours. CBG: No results for input(s): GLUCAP in the last 168 hours. Lipid Profile: No results for input(s): CHOL, HDL, LDLCALC, TRIG, CHOLHDL, LDLDIRECT in the last 72 hours. Thyroid Function Tests: No results for input(s): TSH, T4TOTAL, FREET4, T3FREE, THYROIDAB in the last 72 hours. Anemia Panel: No results for input(s): VITAMINB12, FOLATE, FERRITIN, TIBC, IRON, RETICCTPCT in the last 72 hours. Urine analysis:    Component Value Date/Time   COLORURINE YELLOW 07/11/2007 0010   APPEARANCEUR CLEAR 07/11/2007 0010   LABSPEC 1.015 07/11/2007 0010   PHURINE 8.5 (H) 07/11/2007 0010   GLUCOSEU NEGATIVE 07/11/2007 0010   HGBUR TRACE (A) 07/11/2007 0010   BILIRUBINUR NEGATIVE 07/11/2007 0010   KETONESUR NEGATIVE 07/11/2007 0010   PROTEINUR NEGATIVE 07/11/2007 0010   UROBILINOGEN 0.2 07/11/2007 0010   NITRITE NEGATIVE 07/11/2007 0010   LEUKOCYTESUR NEGATIVE 07/11/2007 0010   Sepsis Labs: @LABRCNTIP (procalcitonin:4,lacticidven:4) ) Recent Results (from the past 240 hour(s))  SARS Coronavirus 2 (CEPHEID - Performed in [facility] hospital lab), Hosp Order     Status: None     Collection Time: 04/19/19  4:26 PM  Result Value Ref Range Status   SARS Coronavirus 2 NEGATIVE NEGATIVE Final    Comment: (NOTE) If result is NEGATIVE SARS-CoV-2 target nucleic acids are NOT DETECTED. The SARS-CoV-2 RNA is generally detectable in upper and lower  respiratory specimens during the acute phase of infection. The lowest  concentration of SARS-CoV-2 viral copies this assay can detect is 250  copies / mL. A negative result does not preclude SARS-CoV-2 infection  and should not be used as the  sole basis for treatment or other  patient management decisions.  A negative result may occur with  improper specimen collection / handling, submission of specimen other  than nasopharyngeal swab, presence of viral mutation(s) within the  areas targeted by this assay, and inadequate number of viral copies  (<250 copies / mL). A negative result must be combined with clinical  observations, patient history, and epidemiological information. If result is POSITIVE SARS-CoV-2 target nucleic acids are DETECTED. The SARS-CoV-2 RNA is generally detectable in upper and lower  respiratory specimens dur ing the acute phase of infection.  Positive  results are indicative of active infection with SARS-CoV-2.  Clinical  correlation with patient history and other diagnostic information is  necessary to determine patient infection status.  Positive results do  not rule out bacterial infection or co-infection with other viruses. If result is PRESUMPTIVE POSTIVE SARS-CoV-2 nucleic acids MAY BE PRESENT.   A presumptive positive result was obtained on the submitted specimen  and confirmed on repeat testing.  While 2019 novel coronavirus  (SARS-CoV-2) nucleic acids may be present in the submitted sample  additional confirmatory testing may be necessary for epidemiological  and / or clinical management purposes  to differentiate between  SARS-CoV-2 and other Sarbecovirus currently known to infect humans.   If clinically indicated additional testing with an alternate test  methodology [phone]) is advised. The SARS-CoV-2 RNA is generally  detectable in upper and lower respiratory sp ecimens during the acute  phase of infection. The expected result is Negative. Fact Sheet for Patients:  BoilerBrush.com.cy Fact Sheet for Healthcare Providers: https://pope.com/ This test is not yet approved or cleared by the [place] FDA and has been authorized for detection and/or diagnosis of SARS-CoV-2 by FDA under an Emergency Use Authorization (EUA).  This EUA will remain in effect (meaning this test can be used) for the duration of the COVID-19 declaration under Section 564(b)(1) of the Act, 21 U.S.C. section 360bbb-3(b)(1), unless the authorization is terminated or revoked sooner. Performed at [facility], [address] [place], [place] [address]          Radiology Studies: Dg Chest 1 View  Result Date: 04/19/2019 CLINICAL DATA:  Pain status post fall EXAM: CHEST  1 VIEW COMPARISON:  02/16/2012 FINDINGS: The heart size and mediastinal contours are within normal limits. Both lungs are clear. The visualized skeletal structures are unremarkable. IMPRESSION: No active disease. Electronically Signed   By: Katherine Mantle M.D.   On: 04/19/2019 15:54   Dg Lumbar Spine Complete  Result Date: 04/20/2019 CLINICAL DATA:  Fall several days ago with back pain, initial encounter EXAM: LUMBAR SPINE - COMPLETE 4+ VIEW COMPARISON:  04/14/2019 FINDINGS: Vertebral body height is well maintained. Multilevel osteophytic changes are noted. No anterolisthesis is seen. Disc space narrowing is noted with the exception of L5-S1. Previously seen lucency at T12-L1 is again noted and stable. No new focal abnormality is noted. IMPRESSION: Stable lucency at T12-L1. This again would be better evaluated with CT as previously described. No new focal abnormality is seen.  Electronically Signed   By: Alcide Clever M.D.   On: 04/20/2019 11:53   Ct Head Wo Contrast  Result Date: 04/19/2019 CLINICAL DATA:  Fall. EXAM: CT HEAD WITHOUT CONTRAST CT CERVICAL SPINE WITHOUT CONTRAST TECHNIQUE: Multidetector CT imaging of the head and cervical spine was performed following the standard protocol without intravenous contrast. Multiplanar CT image reconstructions of the cervical spine were also generated. COMPARISON:  Cervical spine x-rays dated October 07, 2009.  FINDINGS: CT HEAD FINDINGS Brain: No evidence of acute infarction, hemorrhage, hydrocephalus, extra-axial collection or mass lesion/mass effect. Vascular: No hyperdense vessel or unexpected calcification. Skull: Normal. Negative for fracture or focal lesion. Sinuses/Orbits: No acute finding. Other: None. CT CERVICAL SPINE FINDINGS Alignment: Straightening of the normal cervical lordosis. No traumatic malalignment. Skull base and vertebrae: No acute fracture. No primary bone lesion or focal pathologic process. Soft tissues and spinal canal: No prevertebral fluid or swelling. No visible canal hematoma. Disc levels: Disc heights are relatively preserved. Evidence of diffuse idiopathic skeletal hyperostosis. Ankylosed bilateral facet joints at C3-C4 and C4-C5. Ankylosed bilateral uncovertebral joints from C3-C4 through C6-C7. Moderate to severe bilateral facet arthropathy at C2-C3. Upper chest: Negative. Other: None. IMPRESSION: 1.  No acute intracranial abnormality. 2.  No acute cervical spine fracture. Electronically Signed   By: Obie Dredge M.D.   On: 04/19/2019 15:28   Ct Cervical Spine Wo Contrast  Result Date: 04/19/2019 CLINICAL DATA:  Fall. EXAM: CT HEAD WITHOUT CONTRAST CT CERVICAL SPINE WITHOUT CONTRAST TECHNIQUE: Multidetector CT imaging of the head and cervical spine was performed following the standard protocol without intravenous contrast. Multiplanar CT image reconstructions of the cervical spine were also generated.  COMPARISON:  Cervical spine x-rays dated October 07, 2009. FINDINGS: CT HEAD FINDINGS Brain: No evidence of acute infarction, hemorrhage, hydrocephalus, extra-axial collection or mass lesion/mass effect. Vascular: No hyperdense vessel or unexpected calcification. Skull: Normal. Negative for fracture or focal lesion. Sinuses/Orbits: No acute finding. Other: None. CT CERVICAL SPINE FINDINGS Alignment: Straightening of the normal cervical lordosis. No traumatic malalignment. Skull base and vertebrae: No acute fracture. No primary bone lesion or focal pathologic process. Soft tissues and spinal canal: No prevertebral fluid or swelling. No visible canal hematoma. Disc levels: Disc heights are relatively preserved. Evidence of diffuse idiopathic skeletal hyperostosis. Ankylosed bilateral facet joints at C3-C4 and C4-C5. Ankylosed bilateral uncovertebral joints from C3-C4 through C6-C7. Moderate to severe bilateral facet arthropathy at C2-C3. Upper chest: Negative. Other: None. IMPRESSION: 1.  No acute intracranial abnormality. 2.  No acute cervical spine fracture. Electronically Signed   By: Obie Dredge M.D.   On: 04/19/2019 15:28   Dg Shoulder Left  Result Date: 04/19/2019 CLINICAL DATA:  Pain status post fall EXAM: LEFT SHOULDER - 2+ VIEW COMPARISON:  None. FINDINGS: There is no acute displaced fracture or dislocation. There are degenerative changes of the glenohumeral and acromioclavicular joints. The osseous mineralization is within normal limits. IMPRESSION: No acute osseous abnormality. Electronically Signed   By: Katherine Mantle M.D.   On: 04/19/2019 15:53   Dg Humerus Left  Result Date: 04/19/2019 CLINICAL DATA:  68 year old male with a history of fall EXAM: LEFT HUMERUS - 2+ VIEW COMPARISON:  None FINDINGS: No acute displaced fracture. Glenohumeral joint appears congruent. No radiopaque foreign body. IMPRESSION: Negative for acute bony abnormality. Electronically Signed   By: Gilmer Mor D.O.    On: 04/19/2019 15:56   Dg Hips Bilat With Pelvis Min 5 Views  Result Date: 04/19/2019 CLINICAL DATA:  Pain status post fall EXAM: DG HIP (WITH OR WITHOUT PELVIS) 5+V BILAT COMPARISON:  None. FINDINGS: There is no acute displaced fracture or dislocation. Mild-to-moderate degenerative changes are noted of both hips. IMPRESSION: No acute osseous abnormality. Electronically Signed   By: Katherine Mantle M.D.   On: 04/19/2019 15:56      Scheduled Meds:  DULoxetine  60 mg Oral BID   enoxaparin (LOVENOX) injection  50 mg Subcutaneous Q24H   gabapentin  600 mg  Oral QHS   lidocaine  1 patch Transdermal Q24H   morphine  30 mg Oral Q12H   pantoprazole  40 mg Oral Daily   polyethylene glycol  17 g Oral Daily   traZODone  50 mg Oral QHS   Continuous Infusions:  sodium chloride 125 mL/hr at 04/21/19 0503     LOS: 2 days      Calvert CantorSaima Adeyemi Hamad, MD [facility] Pager: [email or web address] Password [email or web address] 04/21/2019, 10:30 AM

## 2019-04-21 NOTE — Clinical Social Work Note (Signed)
LCSW spoke with Maggie Schwalbe, friend, listed on chart. She provided hx. Patient has a bone disease that impacts his walking. He is in bed most of the day eating cookies, drinking coke, watching the news, reading the Bible and listening to gospel. She stated that prior to church discontinuing meeting, he would drive to church. Patient has pain issues due to bone disease and falls. She states that he takes too much pain medication in an attempt to alleviate his pain.  He bathes on Saturday and it takes him 4.5 hours and eats fast food too often.  Ms. Christell Constant stated that she checks on patient twice daily. LCSW discussed that patient was not interested in SNF and had chose HHPT (RN, PT, aide). LCSW discussed that patient had ambulated 36ft with a rolling walker during PT evaluation and discussed recommendation. Ms. Christell Constant stated that she is agreeable to patient returning home as long as HH is ordered.  She also requested a hospital bed for patient. (Attending aware and ordered). Ms. Christell Constant indicated that she would take increase her visits with patient while he recovers.     [employer], Juleen China, LCSW

## 2019-04-21 NOTE — Progress Notes (Signed)
Physical Therapy Treatment Patient Details Name: [name] MRN: [id number] DOB: 1951/09/17 Today's Date: 04/21/2019    History of Present Illness [name] is a 68 y.o. male with a history of diffuse idiopathic skeletal hyperostosis (DISH), chronic pain management, GERD, impaired hearing.  Patient seen after a fall Monday night.  Patient does not recall the fall but woke up on the floor in the morning.  He laid on the floor until approximately noon Tuesday, when a neighbor checked on him and helped him to the bed.  Patient laid in bed for the next 24 hours attempting to recover, however continue to mount increasing pain.  Pain mostly in the patient's back, left buttock, left shoulder.  Pain is nonradiating and is severe.  No palliating or provoking factors.  Patient has not been taking his pain medication since the fall.    PT Comments    Pt supine in bed and willing to participate.  Pt slow and jerky movements with bed mobility and transfer training.  Mod A required with rolling in bed, cueing for hand placement on handrails to assist.  Min A with sit to stand and gait training with RW.  Cueing to improve posture, stand within RW and overall gait mechanics to assist with safe mechanics.  Following ambulation 49ft pt very fatigued.  Pt complete 2 sets of sit to stand and small sidestep towards head of bed with increased A required due to fatigue and generalized weakness.  EOS pt left in bed with call bell within reach and bed alarm set.  NT requested for cleansing and informed that condom catheter was removed and laying on floor by bedside.  No reports of increased pain.       Follow Up Recommendations  SNF;Supervision for mobility/OOB;Supervision - Intermittent     Equipment Recommendations  None recommended by PT    Recommendations for Other Services       Precautions / Restrictions Precautions Precautions: Fall Restrictions Weight Bearing Restrictions: No    Mobility  Bed  Mobility Overal bed mobility: Needs Assistance Bed Mobility: Supine to Sit Rolling: Min assist;Mod assist   Supine to sit: Mod assist     General bed mobility comments: slow labored movement with difficulty using LUE due to pain  Transfers Overall transfer level: Needs assistance Equipment used: Rolling walker (2 wheeled) Transfers: Sit to/from Stand Sit to Stand: Min assist         General transfer comment: slow unsteady labored movement  Ambulation/Gait Ambulation/Gait assistance: Editor, commissioning (Feet): 55 Feet Assistive device: Rolling walker (2 wheeled) Gait Pattern/deviations: Decreased step length - right;Decreased step length - left;Decreased stride length Gait velocity: decreased   General Gait Details: slightly labored cadence with wide base of support and shakiness, limited secondary to fatigue, no loss of balance   Stairs             Wheelchair Mobility    Modified Rankin (Stroke Patients Only)       Balance                                            Cognition Arousal/Alertness: Awake/alert Behavior During Therapy: WFL for tasks assessed/performed Overall Cognitive Status: Within Functional Limits for tasks assessed  Exercises      General Comments        Pertinent Vitals/Pain Pain Assessment: Faces Faces Pain Scale: Hurts little more Pain Location: low back and LUE  Pain Descriptors / Indicators: Sore;Grimacing;Guarding Pain Intervention(s): Monitored during session;Limited activity within patient's tolerance    Home Living                      Prior Function            PT Goals (current goals can now be found in the care plan section)      Frequency    Min 3X/week      PT Plan      Co-evaluation              AM-PAC PT "6 Clicks" Mobility   Outcome Measure  Help needed turning from your back to your side while in a  flat bed without using bedrails?: A Little Help needed moving from lying on your back to sitting on the side of a flat bed without using bedrails?: A Lot Help needed moving to and from a bed to a chair (including a wheelchair)?: A Little Help needed standing up from a chair using your arms (e.g., wheelchair or bedside chair)?: A Little Help needed to walk in hospital room?: A Little Help needed climbing 3-5 steps with a railing? : A Lot 6 Click Score: 16    End of Session Equipment Utilized During Treatment: Gait belt Activity Tolerance: Patient tolerated treatment well;Patient limited by fatigue Patient left: in bed;with bed alarm set;with call bell/phone within reach;with nursing/sitter in room Nurse Communication: Mobility status PT Visit Diagnosis: Unsteadiness on feet (R26.81);Other abnormalities of gait and mobility (R26.89);Muscle weakness (generalized) (M62.81)     Time: 1500-1540 PT Time Calculation (min) (ACUTE ONLY): 40 min  Charges:  $Therapeutic Activity: 38-52 mins                     [address]Achaia Garlock, LPTA; CBIS [phone]  Juel BurrowCockerham, Rhylan Kagel Jo 04/21/2019, 3:48 PM

## 2019-04-21 NOTE — Clinical Social Work Note (Cosign Needed Addendum)
Patient requires frequent re-positioning of the body in ways that cannot be achieved with an ordinary bed or wedge pillow to eliminate pain and the head of the bed needs to be evaluated more than 30 degrees most of the time 

## 2019-04-21 NOTE — Care Management Important Message (Signed)
Important Message  Patient Details  Name: [name] MRN: [id number] Date of Birth: 18-Sep-1951   Medicare Important Message Given:  Yes    Corey Harold 04/21/2019, 1:12 PM

## 2019-04-22 LAB — HEPATIC FUNCTION PANEL
ALT: 112 U/L — ABNORMAL HIGH (ref 0–44)
AST: 193 U/L — ABNORMAL HIGH (ref 15–41)
Albumin: 2.8 g/dL — ABNORMAL LOW (ref 3.5–5.0)
Alkaline Phosphatase: 104 U/L (ref 38–126)
Bilirubin, Direct: 0.1 mg/dL (ref 0.0–0.2)
Indirect Bilirubin: 0.6 mg/dL (ref 0.3–0.9)
Total Bilirubin: 0.7 mg/dL (ref 0.3–1.2)
Total Protein: 6.5 g/dL (ref 6.5–8.1)

## 2019-04-22 LAB — BASIC METABOLIC PANEL
Anion gap: 12 (ref 5–15)
BUN: 18 mg/dL (ref 8–23)
CO2: 26 mmol/L (ref 22–32)
Calcium: 8.2 mg/dL — ABNORMAL LOW (ref 8.9–10.3)
Chloride: 104 mmol/L (ref 98–111)
Creatinine, Ser: 1.13 mg/dL (ref 0.61–1.24)
GFR calc Af Amer: >60 mL/min (ref >60)
GFR calc non Af Amer: >60 mL/min (ref >60)
Glucose, Bld: 138 mg/dL — ABNORMAL HIGH (ref 70–99)
Potassium: 3.5 mmol/L (ref 3.5–5.1)
Sodium: 142 mmol/L (ref 135–145)

## 2019-04-22 LAB — CK: Total CK: 3618 U/L — ABNORMAL HIGH (ref 49–397)

## 2019-04-22 MED ORDER — PROMETHAZINE HCL 25 MG/ML IJ SOLN
12.5000 mg | Freq: Once | INTRAMUSCULAR | Status: AC
  Administered 2019-04-22: 12.5 mg via INTRAVENOUS
  Filled 2019-04-22: qty 1

## 2019-04-22 NOTE — NC FL2 (Signed)
[place] MEDICAID FL2 LEVEL OF CARE SCREENING TOOL     IDENTIFICATION  Patient Name: Marlon PelDanny A Sterry Birthdate: 1951/01/26 Sex: male Admission Date (Current Location): 04/19/2019  [facility] [place]   Facility and Address:  [facility],  [address], [place] [phone]      Provider Number: [id number]  Attending Physician Name and Address:  Calvert Cantorizwan, Saima, MD  Relative Name and Phone Number:       Current Level of Care: Hospital Recommended Level of Care: Skilled Nursing Facility Prior Approval Number:    Date Approved/Denied:   PASRR Number:   [id number]   Discharge Plan: SNF    Current Diagnoses: Patient Active Problem List   Diagnosis Date Noted  . Rhabdomyolysis 04/19/2019  . AKI (acute kidney injury) (HCC) 04/19/2019  . Hypocalcemia 04/19/2019  . Leukocytosis 04/19/2019  . Elevated LFTs 04/19/2019  . DISH (diffuse idiopathic skeletal hyperostosis)   . GERD (gastroesophageal reflux disease)   . Impaired hearing   . Chronic pain     Orientation RESPIRATION BLADDER Height & Weight     Self  Normal Continent Weight: 220 lb (99.8 kg) Height:  5\' 8"  (172.7 cm)  BEHAVIORAL SYMPTOMS/MOOD NEUROLOGICAL BOWEL NUTRITION STATUS      Continent Diet(regular)  AMBULATORY STATUS COMMUNICATION OF NEEDS Skin   Limited Assist Verbally Normal                       Personal Care Assistance Level of Assistance  Bathing, Feeding, Dressing Bathing Assistance: Limited assistance Feeding assistance: Independent Dressing Assistance: Limited assistance     Functional Limitations Info             SPECIAL CARE FACTORS FREQUENCY  PT (By licensed PT), OT (By licensed OT)     PT Frequency: 5 OT Frequency: 5            Contractures      Additional Factors Info  Code Status, Allergies Code Status Info: full Allergies Info: CECLOR CEFACLOR, NSAIDS, OTHER            Current Medications (04/22/2019):  This is the current  hospital active medication list Current Facility-Administered Medications  Medication Dose Route Frequency Provider Last Rate Last Dose  . DULoxetine (CYMBALTA) DR capsule 60 mg  60 mg Oral BID Levie HeritageStinson, Jacob J, DO   60 mg at 04/22/19 [id number]  . enoxaparin (LOVENOX) injection 50 mg  50 mg Subcutaneous Q24H Levie HeritageStinson, Jacob J, DO   50 mg at 04/21/19 2125  . gabapentin (NEURONTIN) capsule 600 mg  600 mg Oral QHS Levie HeritageStinson, Jacob J, DO   600 mg at 04/21/19 2118  . lidocaine (LIDODERM) 5 % 1 patch  1 patch Transdermal Q24H Calvert Cantorizwan, Saima, MD   1 patch at 04/22/19 [id number]  . morphine (MS CONTIN) 12 hr tablet 15 mg  15 mg Oral Q12H Calvert Cantorizwan, Saima, MD   15 mg at 04/22/19 0804  . ondansetron (ZOFRAN) tablet 4 mg  4 mg Oral Q6H PRN Levie HeritageStinson, Jacob J, DO       Or  . ondansetron [facility](ZOFRAN) injection 4 mg  4 mg Intravenous Q6H PRN Levie HeritageStinson, Jacob J, DO   4 mg at 04/22/19 [id number]  . oxyCODONE (Oxy IR/ROXICODONE) immediate release tablet 5 mg  5 mg Oral Q4H PRN Calvert Cantorizwan, Saima, MD   5 mg at 04/20/19 1404  . pantoprazole (PROTONIX) EC tablet 40 mg  40 mg Oral Daily Levie HeritageStinson, Jacob J, DO  40 mg at 04/22/19 0804  . polyethylene glycol (MIRALAX / GLYCOLAX) packet 17 g  17 g Oral Daily Calvert Cantor, MD   17 g at 04/22/19 0804  . temazepam (RESTORIL) capsule 15 mg  15 mg Oral QHS PRN Calvert Cantor, MD   15 mg at 04/21/19 2123     Discharge Medications: Please see discharge summary for a list of discharge medications.  Relevant Imaging Results:  Relevant Lab Results:   Additional Information SS: [id number]  Donnie Coffin, LCSW

## 2019-04-22 NOTE — Progress Notes (Signed)
PROGRESS NOTE    [name]   ZOX:[id number]  DOB: Feb 20, 1951  DOA: 04/19/2019 PCP: Gareth Morgan, MD   Brief Narrative:  [name] is a 68 y.o. male with a history of diffuse idiopathic skeletal hyperostosis (DISH), chronic pain management, GERD, impaired hearing. He presents after being found on the floor. The patient states that the room he was found in is not a room that he routinely goes into and he does not recall walking into this room. He was laying on his left side when he was found and was noted to have severe left arm swelling and pain in the ED along with difficulty moving the arm. He was also noted to have rhabdomyolysis and AKI.    Subjective: He states today that he does not want to go home and would like to go to SNF. He is not safe to go home alone.     Assessment & Plan:   Principal Problem:   Rhabdomyolysis/ elevated LFTs - likely due to fall and trauma to his left arm and leg hip - CK is improving-  - will d/c IVF today  Active Problems: Syncope - cause is undetermined- he does not recall even entering the room where he was found on the floor- he states he had not taken too much pain medications - head CT is unrevealing- I did order an MRI however, he was too big for the MRI machine.  Trauma to left arm - significant left arm swelling due to laying on the arm - continue conservative management- no signs of compartment syndrome- swelling continues to improve and he is gaining more motion   Lower back pain after a fall 3 wks ago - has midline lumbar pain without radiation to his legs - xray of L spine > Stable lucency at T12-L1 - Unable to obtain MRI as mentioned above - cont pain control    AKI (acute kidney injury)   - due to rhabdomyolysis vs dehydration  - is improved with IVF  Hypokalemia - replaced    DISH (diffuse idiopathic skeletal hyperostosis)   Chronic pain - cont narcotics for pain> Neurotntin, MS Contin, Oxycodone, Cymbalta  -  have added Lidoderm patch in attempt to limit narcotics   Time spent in minutes: 35 DVT prophylaxis: Lovenox Code Status: Full code Family Communication:  Disposition Plan: likely SNF tomorrow or Monday Consultants:   none Procedures:   none Antimicrobials:  Anti-infectives (From admission, onward)   None       Objective: Vitals:   04/21/19 2107 04/21/19 2107 04/22/19 0555 04/22/19 0823  BP: (!) 161/86 (!) 161/86 (!) 162/89   Pulse: (!) 108 (!) 102 68   Resp:   18   Temp:   98.6 F (37 C)   TempSrc:   Oral   SpO2: 98% 98% 98% 97%  Weight:      Height:        Intake/Output Summary (Last 24 hours) at 04/22/2019 1136 Last data filed at 04/22/2019 0300 Gross per 24 hour  Intake 2463.06 ml  Output 300 ml  Net 2163.06 ml   Filed Weights   04/19/19 1326  Weight: 99.8 kg    Examination: General exam: Appears comfortable  HEENT: PERRLA, oral mucosa moist, no sclera icterus or thrush Respiratory system: Clear to auscultation. Respiratory effort normal. Cardiovascular system: S1 & S2 heard,  No murmurs  Gastrointestinal system: Abdomen soft, non-tender, nondistended. Normal bowel sounds   Central nervous system: Alert and oriented. No focal  neurological deficits. Extremities: No cyanosis, clubbing - left arm less swollen today Skin: No rashes or ulcers Psychiatry:  Mood & affect appropriate.     Data Reviewed: I have personally reviewed following labs and imaging studies  CBC: Recent Labs  Lab 04/19/19 1421 04/20/19 0600  WBC 15.0* 8.5  NEUTROABS 12.7*  --   HGB 14.3 13.1  HCT 41.7 40.4  MCV 86.0 90.4  PLT 309 250   Basic Metabolic Panel: Recent Labs  Lab 04/19/19 1421 04/19/19 2011 04/20/19 0600 04/20/19 0815 04/20/19 1322 04/22/19 0610  NA 132*  --  139  --  136 142  K 3.7  --  3.4*  --  3.8 3.5  CL 93*  --  99  --  102 104  CO2 25  --  27  --  25 26  GLUCOSE 132*  --  113*  --  113* 138*  BUN 52*  --  39*  --  32* 18  CREATININE 2.52*  2.20* 1.83*  --  1.53* 1.13  CALCIUM 8.4*  --  8.3*  --  8.2* 8.2*  MG  --   --   --  2.0  --   --    GFR: Estimated Creatinine Clearance: 70.7 mL/min (by C-G formula based on SCr of 1.13 mg/dL). Liver Function Tests: Recent Labs  Lab 04/19/19 1421 04/20/19 0600 04/20/19 1322  AST 264* 277* 277*  ALT 107* 112* 111*  ALKPHOS 131* 109 100  BILITOT 0.7 0.7 0.7  PROT 6.7 6.5 5.9*  ALBUMIN 3.1* 2.9* 2.6*   No results for input(s): LIPASE, AMYLASE in the last 168 hours. No results for input(s): AMMONIA in the last 168 hours. Coagulation Profile: No results for input(s): INR, PROTIME in the last 168 hours. Cardiac Enzymes: Recent Labs  Lab 04/19/19 1421 04/19/19 2011 04/20/19 0600 04/21/19 0431 04/22/19 0610  CKTOTAL 9,598* 9,702* 9,086* 5,742* 3,618*   BNP (last 3 results) No results for input(s): PROBNP in the last 8760 hours. HbA1C: No results for input(s): HGBA1C in the last 72 hours. CBG: No results for input(s): GLUCAP in the last 168 hours. Lipid Profile: No results for input(s): CHOL, HDL, LDLCALC, TRIG, CHOLHDL, LDLDIRECT in the last 72 hours. Thyroid Function Tests: No results for input(s): TSH, T4TOTAL, FREET4, T3FREE, THYROIDAB in the last 72 hours. Anemia Panel: No results for input(s): VITAMINB12, FOLATE, FERRITIN, TIBC, IRON, RETICCTPCT in the last 72 hours. Urine analysis:    Component Value Date/Time   COLORURINE YELLOW 07/11/2007 0010   APPEARANCEUR CLEAR 07/11/2007 0010   LABSPEC 1.015 07/11/2007 0010   PHURINE 8.5 (H) 07/11/2007 0010   GLUCOSEU NEGATIVE 07/11/2007 0010   HGBUR TRACE (A) 07/11/2007 0010   BILIRUBINUR NEGATIVE 07/11/2007 0010   KETONESUR NEGATIVE 07/11/2007 0010   PROTEINUR NEGATIVE 07/11/2007 0010   UROBILINOGEN 0.2 07/11/2007 0010   NITRITE NEGATIVE 07/11/2007 0010   LEUKOCYTESUR NEGATIVE 07/11/2007 0010   Sepsis Labs: @LABRCNTIP (procalcitonin:4,lacticidven:4) ) Recent Results (from the past 240 hour(s))  SARS Coronavirus  2 (CEPHEID - Performed [facility]), Hosp Order     Status: None   Collection Time: 04/19/19  4:26 PM  Result Value Ref Range Status   SARS Coronavirus 2 NEGATIVE NEGATIVE Final    Comment: (NOTE) If result is NEGATIVE SARS-CoV-2 target nucleic acids are NOT DETECTED. The SARS-CoV-2 RNA is generally detectable in upper and lower  respiratory specimens during the acute phase of infection. The lowest  concentration of SARS-CoV-2 viral copies this assay can  detect is 250  copies / mL. A negative result does not preclude SARS-CoV-2 infection  and should not be used as the sole basis for treatment or other  patient management decisions.  A negative result may occur with  improper specimen collection / handling, submission of specimen other  than nasopharyngeal swab, presence of viral mutation(s) within the  areas targeted by this assay, and inadequate number of viral copies  (<250 copies / mL). A negative result must be combined with clinical  observations, patient history, and epidemiological information. If result is POSITIVE SARS-CoV-2 target nucleic acids are DETECTED. The SARS-CoV-2 RNA is generally detectable in upper and lower  respiratory specimens dur ing the acute phase of infection.  Positive  results are indicative of active infection with SARS-CoV-2.  Clinical  correlation with patient history and other diagnostic information is  necessary to determine patient infection status.  Positive results do  not rule out bacterial infection or co-infection with other viruses. If result is PRESUMPTIVE POSTIVE SARS-CoV-2 nucleic acids MAY BE PRESENT.   A presumptive positive result was obtained on the submitted specimen  and confirmed on repeat testing.  While 2019 novel coronavirus  (SARS-CoV-2) nucleic acids may be present in the submitted sample  additional confirmatory testing may be necessary for epidemiological  and / or clinical management purposes  to  differentiate between  SARS-CoV-2 and other Sarbecovirus currently known to infect humans.  If clinically indicated additional testing with an alternate test  methodology [phone]) is advised. The SARS-CoV-2 RNA is generally  detectable in upper and lower respiratory sp ecimens during the acute  phase of infection. The expected result is Negative. Fact Sheet for Patients:  BoilerBrush.com.cy Fact Sheet for Healthcare Providers: https://pope.com/ This test is not yet approved or cleared by the [place] FDA and has been authorized for detection and/or diagnosis of SARS-CoV-2 by FDA under an Emergency Use Authorization (EUA).  This EUA will remain in effect (meaning this test can be used) for the duration of the COVID-19 declaration under Section 564(b)(1) of the Act, 21 U.S.C. section 360bbb-3(b)(1), unless the authorization is terminated or revoked sooner. Performed at [facility], [address], [place], [place] [address]          Radiology Studies: No results found.    Scheduled Meds: . DULoxetine  60 mg Oral BID  . enoxaparin (LOVENOX) injection  50 mg Subcutaneous Q24H  . gabapentin  600 mg Oral QHS  . lidocaine  1 patch Transdermal Q24H  . morphine  15 mg Oral Q12H  . pantoprazole  40 mg Oral Daily  . polyethylene glycol  17 g Oral Daily   Continuous Infusions:    LOS: 3 days      Calvert Cantor, MD [facility] Pager: [email or web address] Password [facility] 04/22/2019, 11:36 AM

## 2019-04-22 NOTE — Progress Notes (Signed)
Patient has been very tearful and anxious during the night.  Patient stated he was not ready to go home, as he would be alone when he was there. Emotional support given to patient.

## 2019-04-23 ENCOUNTER — Inpatient Hospital Stay (HOSPITAL_COMMUNITY): Payer: Medicare HMO

## 2019-04-23 MED ORDER — LORAZEPAM 2 MG/ML IJ SOLN: 0.5000 mg | Freq: Once | INTRAMUSCULAR | Status: DC

## 2019-04-23 MED ORDER — FENTANYL CITRATE (PF) 100 MCG/2ML IJ SOLN
50.0000 ug | Freq: Once | INTRAMUSCULAR | Status: AC
  Administered 2019-04-23: 23:00:00 50 ug via INTRAVENOUS
  Filled 2019-04-23: qty 2

## 2019-04-23 MED ORDER — LABETALOL HCL 5 MG/ML IV SOLN
10.0000 mg | INTRAVENOUS | Status: DC | PRN
  Administered 2019-04-23: 17:00:00 10 mg via INTRAVENOUS
  Filled 2019-04-23: qty 4

## 2019-04-23 MED ORDER — HALOPERIDOL LACTATE 5 MG/ML IJ SOLN
2.0000 mg | Freq: Four times a day (QID) | INTRAMUSCULAR | Status: DC | PRN
  Administered 2019-04-24 (×2): 2 mg via INTRAVENOUS
  Filled 2019-04-23 (×2): qty 1

## 2019-04-23 MED ORDER — LORAZEPAM 2 MG/ML IJ SOLN
0.5000 mg | Freq: Once | INTRAMUSCULAR | Status: AC
  Administered 2019-04-23: 21:00:00 0.5 mg via INTRAVENOUS
  Filled 2019-04-23: qty 1

## 2019-04-23 MED ORDER — FENTANYL CITRATE (PF) 100 MCG/2ML IJ SOLN
25.0000 ug | INTRAMUSCULAR | Status: DC | PRN
  Administered 2019-04-24: 01:00:00 25 ug via INTRAVENOUS
  Filled 2019-04-23: qty 2

## 2019-04-23 NOTE — Progress Notes (Signed)
PROGRESS NOTE    [name]   ZOX:[id number]  DOB: 06/29/1951  DOA: 04/19/2019 PCP: Gareth Morgan, MD   Brief Narrative:  [name] is a 68 y.o. male with a history of diffuse idiopathic skeletal hyperostosis (DISH), chronic pain management, GERD, impaired hearing. He presents after being found on the floor. The patient states that the room he was found in is not a room that he routinely goes into and he does not recall walking into this room. He was laying on his left side when he was found and was noted to have severe left arm swelling and pain in the ED along with difficulty moving the arm. He was also noted to have rhabdomyolysis and AKI.    Subjective: No complaints.     Assessment & Plan:   Principal Problem:   Rhabdomyolysis/ elevated LFTs - likely due to fall and trauma to his left arm and leg hip - CK is improving-     Active Problems: Syncope - cause is undetermined- he does not recall even entering the room where he was found on the floor- he states he had not taken too much pain medications - head CT is unrevealing- I did order an MRI however, he was too big for the MRI machine.  Trauma to left arm - significant left arm swelling due to laying on the arm - continue conservative management- no signs of compartment syndrome- swelling continues to improve and he is gaining more motion   Lower back pain after a fall 3 wks ago - has midline lumbar pain without radiation to his legs - xray of L spine > Stable lucency at T12-L1 - Unable to obtain MRI as mentioned above - cont pain control    AKI (acute kidney injury)   - due to rhabdomyolysis vs dehydration  - is improved with IVF  Hypokalemia - replaced    DISH (diffuse idiopathic skeletal hyperostosis)   Chronic pain - cont narcotics for pain> Neurotntin, MS Contin, Oxycodone, Cymbalta  - have added Lidoderm patch in attempt to limit narcotics   Time spent in minutes: 35 DVT prophylaxis: Lovenox  Code Status: Full code Family Communication:  Disposition Plan:  Awaiting SNF Consultants:   none Procedures:   none Antimicrobials:  Anti-infectives (From admission, onward)   None       Objective: Vitals:   04/22/19 2127 04/22/19 2341 04/23/19 0647 04/23/19 0747  BP: (!) 170/94 (!) 150/89 (!) 151/76   Pulse: (!) 110 (!) 108 94   Resp: 18  20   Temp: 98.5 F (36.9 C)  98.4 F (36.9 C)   TempSrc: Oral  Oral   SpO2: 99%  96% 95%  Weight:      Height:        Intake/Output Summary (Last 24 hours) at 04/23/2019 1228 Last data filed at 04/23/2019 8119 Gross per 24 hour  Intake 240 ml  Output -  Net 240 ml   Filed Weights   04/19/19 1326  Weight: 99.8 kg    Examination: General exam: Appears comfortable  HEENT: PERRLA, oral mucosa moist, no sclera icterus or thrush Respiratory system: Clear to auscultation. Respiratory effort normal. Cardiovascular system: S1 & S2 heard,  No murmurs  Gastrointestinal system: Abdomen soft, non-tender, nondistended. Normal bowel sounds   Central nervous system: Alert and oriented. No focal neurological deficits. Extremities: No cyanosis, clubbing - swelling of left arm is improving but still present Skin: No rashes or ulcers Psychiatry:  Mood & affect appropriate.  Data Reviewed: I have personally reviewed following labs and imaging studies  CBC: Recent Labs  Lab 04/19/19 1421 04/20/19 0600  WBC 15.0* 8.5  NEUTROABS 12.7*  --   HGB 14.3 13.1  HCT 41.7 40.4  MCV 86.0 90.4  PLT 309 250   Basic Metabolic Panel: Recent Labs  Lab 04/19/19 1421 04/19/19 2011 04/20/19 0600 04/20/19 0815 04/20/19 1322 04/22/19 0610  NA 132*  --  139  --  136 142  K 3.7  --  3.4*  --  3.8 3.5  CL 93*  --  99  --  102 104  CO2 25  --  27  --  25 26  GLUCOSE 132*  --  113*  --  113* 138*  BUN 52*  --  39*  --  32* 18  CREATININE 2.52* 2.20* 1.83*  --  1.53* 1.13  CALCIUM 8.4*  --  8.3*  --  8.2* 8.2*  MG  --   --   --  2.0  --   --     GFR: Estimated Creatinine Clearance: 70.7 mL/min (by C-G formula based on SCr of 1.13 mg/dL). Liver Function Tests: Recent Labs  Lab 04/19/19 1421 04/20/19 0600 04/20/19 1322 04/22/19 1157  AST 264* 277* 277* 193*  ALT 107* 112* 111* 112*  ALKPHOS 131* 109 100 104  BILITOT 0.7 0.7 0.7 0.7  PROT 6.7 6.5 5.9* 6.5  ALBUMIN 3.1* 2.9* 2.6* 2.8*   No results for input(s): LIPASE, AMYLASE in the last 168 hours. No results for input(s): AMMONIA in the last 168 hours. Coagulation Profile: No results for input(s): INR, PROTIME in the last 168 hours. Cardiac Enzymes: Recent Labs  Lab 04/19/19 1421 04/19/19 2011 04/20/19 0600 04/21/19 0431 04/22/19 0610  CKTOTAL 9,598* 9,702* 9,086* 5,742* 3,618*   BNP (last 3 results) No results for input(s): PROBNP in the last 8760 hours. HbA1C: No results for input(s): HGBA1C in the last 72 hours. CBG: No results for input(s): GLUCAP in the last 168 hours. Lipid Profile: No results for input(s): CHOL, HDL, LDLCALC, TRIG, CHOLHDL, LDLDIRECT in the last 72 hours. Thyroid Function Tests: No results for input(s): TSH, T4TOTAL, FREET4, T3FREE, THYROIDAB in the last 72 hours. Anemia Panel: No results for input(s): VITAMINB12, FOLATE, FERRITIN, TIBC, IRON, RETICCTPCT in the last 72 hours. Urine analysis:    Component Value Date/Time   COLORURINE YELLOW 07/11/2007 0010   APPEARANCEUR CLEAR 07/11/2007 0010   LABSPEC 1.015 07/11/2007 0010   PHURINE 8.5 (H) 07/11/2007 0010   GLUCOSEU NEGATIVE 07/11/2007 0010   HGBUR TRACE (A) 07/11/2007 0010   BILIRUBINUR NEGATIVE 07/11/2007 0010   KETONESUR NEGATIVE 07/11/2007 0010   PROTEINUR NEGATIVE 07/11/2007 0010   UROBILINOGEN 0.2 07/11/2007 0010   NITRITE NEGATIVE 07/11/2007 0010   LEUKOCYTESUR NEGATIVE 07/11/2007 0010   Sepsis Labs: @LABRCNTIP (procalcitonin:4,lacticidven:4) ) Recent Results (from the past 240 hour(s))  SARS Coronavirus 2 (CEPHEID - Performed in [facility] hospital lab), Hosp  Order     Status: None   Collection Time: 04/19/19  4:26 PM  Result Value Ref Range Status   SARS Coronavirus 2 NEGATIVE NEGATIVE Final    Comment: (NOTE) If result is NEGATIVE SARS-CoV-2 target nucleic acids are NOT DETECTED. The SARS-CoV-2 RNA is generally detectable in upper and lower  respiratory specimens during the acute phase of infection. The lowest  concentration of SARS-CoV-2 viral copies this assay can detect is 250  copies / mL. A negative result does not preclude SARS-CoV-2 infection  and should not  be used as the sole basis for treatment or other  patient management decisions.  A negative result may occur with  improper specimen collection / handling, submission of specimen other  than nasopharyngeal swab, presence of viral mutation(s) within the  areas targeted by this assay, and inadequate number of viral copies  (<250 copies / mL). A negative result must be combined with clinical  observations, patient history, and epidemiological information. If result is POSITIVE SARS-CoV-2 target nucleic acids are DETECTED. The SARS-CoV-2 RNA is generally detectable in upper and lower  respiratory specimens dur ing the acute phase of infection.  Positive  results are indicative of active infection with SARS-CoV-2.  Clinical  correlation with patient history and other diagnostic information is  necessary to determine patient infection status.  Positive results do  not rule out bacterial infection or co-infection with other viruses. If result is PRESUMPTIVE POSTIVE SARS-CoV-2 nucleic acids MAY BE PRESENT.   A presumptive positive result was obtained on the submitted specimen  and confirmed on repeat testing.  While 2019 novel coronavirus  (SARS-CoV-2) nucleic acids may be present in the submitted sample  additional confirmatory testing may be necessary for epidemiological  and / or clinical management purposes  to differentiate between  SARS-CoV-2 and other Sarbecovirus currently  known to infect humans.  If clinically indicated additional testing with an alternate test  methodology [phone]) is advised. The SARS-CoV-2 RNA is generally  detectable in upper and lower respiratory sp ecimens during the acute  phase of infection. The expected result is Negative. Fact Sheet for Patients:  BoilerBrush.com.cy Fact Sheet for Healthcare Providers: https://pope.com/ This test is not yet approved or cleared by the [place] FDA and has been authorized for detection and/or diagnosis of SARS-CoV-2 by FDA under an Emergency Use Authorization (EUA).  This EUA will remain in effect (meaning this test can be used) for the duration of the COVID-19 declaration under Section 564(b)(1) of the Act, 21 U.S.C. section 360bbb-3(b)(1), unless the authorization is terminated or revoked sooner. Performed at [facility], [address], [place], [place] [address]          Radiology Studies: No results found.    Scheduled Meds: . DULoxetine  60 mg Oral BID  . enoxaparin (LOVENOX) injection  50 mg Subcutaneous Q24H  . gabapentin  600 mg Oral QHS  . lidocaine  1 patch Transdermal Q24H  . morphine  15 mg Oral Q12H  . pantoprazole  40 mg Oral Daily  . polyethylene glycol  17 g Oral Daily   Continuous Infusions:    LOS: 4 days      Calvert Cantor, MD [facility] Pager: [email or web address] Password [facility] 04/23/2019, 12:28 PM

## 2019-04-23 NOTE — Progress Notes (Signed)
Patient's vitals are as follows:     04/23/19 1630  Vitals  BP (!) 207/191  BP Location Left Arm  BP Method Automatic  Patient Position (if appropriate) Lying  Pulse Rate 97  Pulse Rate Source Dinamap  Resp 18  Oxygen Therapy  SpO2 100 %  O2 Device Nasal Cannula  O2 Flow Rate (L/min) 2 L/min  Pain Assessment  Pain Scale 0-10  Pain Score 0  POSS Scale ([place]ero Opioid Sedation Scale)  POSS *See Group Information* 1-Acceptable,Awake and alert  Level of Consciousness  Level of Consciousness Alert  MEWS Score  MEWS RR 0  MEWS Pulse 0  MEWS Systolic 2  MEWS LOC 0  MEWS Temp 0  MEWS Score 2  MEWS Score Color Yellow   MD has been verbally informed. Will continue to monitor patient.
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
## 2019-04-23 NOTE — Progress Notes (Signed)
Patient's B/P 190/72, pulse 97. Labetalol injection 10 mg Intravenous administered per PRN order. Will continue to monitor patient.

## 2019-04-23 NOTE — Progress Notes (Signed)
Patient received PRN Labetalol. Vital signs are as follows    04/23/19 1808  Vitals  BP (!) 165/78  BP Location Left Arm  BP Method Automatic  Patient Position (if appropriate) Lying  Pulse Rate 92  Pulse Rate Source Dinamap  Resp 18  Oxygen Therapy  SpO2 100 %  O2 Device Nasal Cannula  O2 Flow Rate (L/min) 2 L/min  PAINAD (Pain Assessment in Advanced Dementia)  Breathing 0  Negative Vocalization 0  Facial Expression 0  Body Language 0  Consolability 0  PAINAD Score 0  MEWS Score  MEWS RR 0  MEWS Pulse 0  MEWS Systolic 0  MEWS LOC 0  MEWS Temp 0  MEWS Score 0  MEWS Score Color Green   Patient is currently resting comfortably. Will continue to monitor patient.

## 2019-04-23 NOTE — Progress Notes (Signed)
04/23/19 1354  What Happened  Was fall witnessed? No  Was patient injured? Yes  Patient found on floor  Found by Staff-comment Misty Stanley(Jahniah Pallas RN )  Stated prior activity ambulating-unassisted  Follow Up  MD notified Dr. Butler Denmarkizwan.   Time MD notified [id number]  Family notified Yes-comment Maggie Schwalbe(Cheryl Moore )  Time family notified 1400  Additional tests Yes-comment  Simple treatment Other (comment)  Progress note created (see row info) Yes  Adult Fall Risk Assessment  Risk Factor Category (scoring not indicated) High fall risk per protocol (document High fall risk)  Patient Fall Risk Level High fall risk  Adult Fall Risk Interventions  Required Bundle Interventions *See Row Information* High fall risk - low, moderate, and high requirements implemented  Additional Interventions Use of appropriate toileting equipment (bedpan, BSC, etc.);Room near nurses station;PT/OT need assessed if change in mobility from baseline;Pharmacy review of medications  Screening for Fall Injury Risk (To be completed on HIGH fall risk patients) - Assessing Need for Low Bed  Risk For Fall Injury- Low Bed Criteria None identified - Continue screening  Will Implement Low Bed and Floor Mats Low bed contraindicated, floor mats in place  Screening for Fall Injury Risk (To be completed on HIGH fall risk patients who do not meet crieteria for Low Bed) - Assessing Need for Floor Mats Only  Risk For Fall Injury- Criteria for Floor Mats Noncompliant with safety precautions  Vitals  Temp 98.2 F (36.8 C)  Temp Source Oral  BP (!) 143/125  BP Location Left Arm  BP Method Automatic  Patient Position (if appropriate) Sitting  Pulse Rate (!) 104  Pulse Rate Source Dinamap  Resp 18  Oxygen Therapy  SpO2 100 %  O2 Device Room Air  Pain Assessment  Pain Scale 0-10  Pain Score 0  Pain Type Chronic pain  Pain Location Back  Pain Orientation Lower  Pain Descriptors / Indicators Aching;Constant;Discomfort  Pain Frequency Constant   Pain Onset On-going  Patients Stated Pain Goal 0  Pain Intervention(s) Environmental changes;Emotional support;Rest;Relaxation  Multiple Pain Sites No  Neurological  Neuro (WDL) WDL  Level of Consciousness Alert  Orientation Level Oriented X4  Cognition Follows commands  Speech Clear  Pupil Assessment  No  Neuro Symptoms Tremors  Neuro Additional Assessments Yes  Musculoskeletal  Musculoskeletal (WDL) X  Assistive Device Front wheel walker  Generalized Weakness Yes  Weight Bearing Restrictions No  Integumentary  Integumentary (WDL) X  Skin Color Appropriate for ethnicity  Skin Condition Dry  Skin Integrity Abrasion;Ecchymosis  Abrasion Location Leg  Abrasion Location Orientation Left;Right  Abrasion Intervention Other (Comment) (assessed)  Ecchymosis Location Shoulder;Knee  Ecchymosis Location Orientation Left;Right  Ecchymosis Intervention Other (Comment) (assessed)  Skin Turgor Non-tenting  Pain Assessment  Result of Injury No  Pain Screening  Clinical Progression Not changed  Pain Assessment  Work-Related Injury No   Patient complained to HCPOA that he wants to sit in chair. HCPOA called me to inform me that patient wanted to be up in chair. Patient was ambulated to chair with chair alarm in place, telephone and call button within reach. Patient's Bedside table was also placed in front of patient and chair alarm active. Patient had all fall prevention protocol in place including yellow arm band and yellow socks.  Patient ambulated unassisted despite education on fall prevention plan including calling for assistance. Patient is alert and oriented.   Patient found on floor at door entrance of his room. Patient states he hit his head. Abrasion to  right forehead noted. Patient denies any acute pain or discomfort. Patient alert and oriented but is unable to indicate why he was ambulating unassisted. MD has been informed and CT scan has been ordered. Patient has been assisted  back to bed and resting comfortably. Telemonitoring [employer] is being implemented.

## 2019-04-23 NOTE — Progress Notes (Signed)
Pt tx from 300 d/t fall and AMS. When pt arrived all pain medications were discontinued d/t AMS- Pt alert to self, year when asked but keeps forgetting where he is.  Xray of R wrist shows fracture- pt continuously c/o pain in back, wrist and hips. Dr. Robb Matar paged to see if pain medication could be ordered for patient. Waiting for call back/orders. Will continue to monitor pt.

## 2019-04-23 NOTE — Progress Notes (Signed)
Patient returned from CT scan to head. Patient very disoriented and alert to self. MD has been notified.

## 2019-04-23 NOTE — Progress Notes (Signed)
Informed by patient's caregiver/HCPOA Maggie Schwalbe that 4 days prior to patient's fall at home, patient had stated that he slept all night in his car and was unaware of his location. Per caregiver patient did not know how he arrived at home. In addition, caregiver indicated that patient takes his own medication but is excessive with his pain medication regime and takes more than prescribed dosage. Per caregiver she does not know if this can be helpful to MD.

## 2019-04-23 NOTE — Progress Notes (Signed)
Night shift floor coverage note.  The patient was seen due to emesis, AMS and restlessness.  Patient had a fall injuring his head earlier today.  His vital signs are stable.  He is oriented to name, has some difficulty knowing he is out of the hospital and is disoriented to time.  Chest was clear.  CV, S1S2 RRR.  Abdomen was soft.  Pupils were mydriatic and equal at 7 mm.  He is able to move all extremities and seemed to be nonfocal.  However, his right-hand looked edematous.  Repeat CT head was done and did not show any acute infarct cranial pathology.  Right hand and right wrist films were obtained and revealed a comminuted and dorsally impacted distal right radius fracture with DRU involvement.  There was ulnar styloid fracture.  Temporary immobilization has been done twice, but the patient removes it.  He has been placed on bilateral hand mitts.  Analgesics and anxiolytics have been given.  After checking MAR, the patient probably may have some degree to opioid withdrawal, since he has not been getting MS Contin and as needed oxycodone as he is normally scheduled to do.  Will consult orthopedic surgery to see if the patient will need surgery or cast immobilization.  Sanda Klein, MD.  This document was prepared using Dragon voice recognition software and may contain some unintended transcription errors.

## 2019-04-24 ENCOUNTER — Other Ambulatory Visit: Payer: Self-pay

## 2019-04-24 LAB — BASIC METABOLIC PANEL
Anion gap: 14 (ref 5–15)
BUN: 21 mg/dL (ref 8–23)
CO2: 28 mmol/L (ref 22–32)
Calcium: 8.4 mg/dL — ABNORMAL LOW (ref 8.9–10.3)
Chloride: 99 mmol/L (ref 98–111)
Creatinine, Ser: 1.06 mg/dL (ref 0.61–1.24)
GFR calc Af Amer: >60 mL/min (ref >60)
GFR calc non Af Amer: >60 mL/min (ref >60)
Glucose, Bld: 107 mg/dL — ABNORMAL HIGH (ref 70–99)
Potassium: 3.4 mmol/L — ABNORMAL LOW (ref 3.5–5.1)
Sodium: 141 mmol/L (ref 135–145)

## 2019-04-24 LAB — URINALYSIS, ROUTINE W REFLEX MICROSCOPIC
Bilirubin Urine: NEGATIVE
Glucose, UA: NEGATIVE mg/dL
Ketones, ur: NEGATIVE mg/dL
Nitrite: NEGATIVE
Protein, ur: 30 mg/dL — AB
Specific Gravity, Urine: 1.017 (ref 1.005–1.030)
WBC, UA: >50 WBC/hpf — ABNORMAL HIGH (ref 0–5)
pH: 6 (ref 5.0–8.0)

## 2019-04-24 LAB — CBC
HCT: 36.3 % — ABNORMAL LOW (ref 39.0–52.0)
Hemoglobin: 12.4 g/dL — ABNORMAL LOW (ref 13.0–17.0)
MCH: 29.9 pg (ref 26.0–34.0)
MCHC: 34.2 g/dL (ref 30.0–36.0)
MCV: 87.5 fL (ref 80.0–100.0)
Platelets: 259 10*3/uL (ref 150–400)
RBC: 4.15 MIL/uL — ABNORMAL LOW (ref 4.22–5.81)
RDW: 13.1 % (ref 11.5–15.5)
WBC: 13.2 10*3/uL — ABNORMAL HIGH (ref 4.0–10.5)
nRBC: 0 % (ref 0.0–0.2)

## 2019-04-24 LAB — MRSA PCR SCREENING: MRSA by PCR: NEGATIVE

## 2019-04-24 MED ORDER — QUETIAPINE FUMARATE 25 MG PO TABS
12.5000 mg | ORAL_TABLET | Freq: Two times a day (BID) | ORAL | Status: DC
  Administered 2019-04-24 – 2019-04-26 (×5): 12.5 mg via ORAL
  Filled 2019-04-24 (×5): qty 1

## 2019-04-24 MED ORDER — LORAZEPAM 2 MG/ML IJ SOLN
0.5000 mg | Freq: Once | INTRAMUSCULAR | Status: AC
  Administered 2019-04-24: 0.5 mg via INTRAVENOUS
  Filled 2019-04-24: qty 1

## 2019-04-24 MED ORDER — HYDROMORPHONE HCL 1 MG/ML IJ SOLN
1.0000 mg | Freq: Once | INTRAMUSCULAR | Status: AC
  Administered 2019-04-24: 02:00:00 1 mg via INTRAVENOUS
  Filled 2019-04-24: qty 1

## 2019-04-24 MED ORDER — OXYCODONE HCL 5 MG PO TABS
5.0000 mg | ORAL_TABLET | Freq: Four times a day (QID) | ORAL | Status: DC | PRN
  Administered 2019-04-25 – 2019-04-26 (×2): 5 mg via ORAL
  Filled 2019-04-24 (×2): qty 1

## 2019-04-24 MED ORDER — LORAZEPAM 2 MG/ML IJ SOLN
0.5000 mg | Freq: Four times a day (QID) | INTRAMUSCULAR | Status: DC | PRN
  Administered 2019-04-24: 17:00:00 0.5 mg via INTRAVENOUS
  Filled 2019-04-24: qty 1

## 2019-04-24 MED ORDER — ACETAMINOPHEN 325 MG PO TABS: 650.0000 mg | ORAL_TABLET | Freq: Four times a day (QID) | ORAL | Status: DC | PRN

## 2019-04-24 MED ORDER — LORAZEPAM 2 MG/ML IJ SOLN
0.5000 mg | INTRAMUSCULAR | Status: AC | PRN
  Administered 2019-04-24 (×2): 0.5 mg via INTRAVENOUS
  Filled 2019-04-24 (×2): qty 1

## 2019-04-24 MED ORDER — DULOXETINE HCL 60 MG PO CPEP
60.0000 mg | ORAL_CAPSULE | Freq: Two times a day (BID) | ORAL | Status: DC
  Administered 2019-04-24 – 2019-04-26 (×5): 60 mg via ORAL
  Filled 2019-04-24 (×5): qty 1

## 2019-04-24 MED ORDER — ORAL CARE MOUTH RINSE
15.0000 mL | Freq: Two times a day (BID) | OROMUCOSAL | Status: DC
  Administered 2019-04-24 – 2019-04-26 (×4): 15 mL via OROMUCOSAL

## 2019-04-24 MED ORDER — HYDROMORPHONE HCL 1 MG/ML IJ SOLN
1.0000 mg | Freq: Once | INTRAMUSCULAR | Status: AC
  Administered 2019-04-24: 03:00:00 1 mg via INTRAVENOUS
  Filled 2019-04-24: qty 1

## 2019-04-24 MED ORDER — MORPHINE SULFATE ER 15 MG PO TBCR
15.0000 mg | EXTENDED_RELEASE_TABLET | Freq: Two times a day (BID) | ORAL | Status: DC
  Administered 2019-04-24 – 2019-04-26 (×5): 15 mg via ORAL
  Filled 2019-04-24 (×5): qty 1

## 2019-04-24 MED ORDER — CHLORHEXIDINE GLUCONATE CLOTH 2 % EX PADS
6.0000 | MEDICATED_PAD | Freq: Every day | CUTANEOUS | Status: DC
  Administered 2019-04-24 – 2019-04-26 (×3): 6 via TOPICAL

## 2019-04-24 MED ORDER — HYDROMORPHONE HCL 1 MG/ML IJ SOLN
1.0000 mg | INTRAMUSCULAR | Status: DC | PRN
  Administered 2019-04-24: 1 mg via INTRAVENOUS
  Filled 2019-04-24: qty 1

## 2019-04-24 MED ORDER — ALPRAZOLAM 0.5 MG PO TABS
0.5000 mg | ORAL_TABLET | Freq: Three times a day (TID) | ORAL | Status: DC
  Administered 2019-04-24 – 2019-04-26 (×6): 0.5 mg via ORAL
  Filled 2019-04-24 (×6): qty 1

## 2019-04-24 NOTE — Progress Notes (Signed)
Physical Therapy Treatment/Reassessment Patient Details Name: [name] MRN: [id number] DOB: 1951/11/18 Today's Date: 04/24/2019    History of Present Illness [name] is a 68 y.o. male with a history of diffuse idiopathic skeletal hyperostosis (DISH), chronic pain management, GERD, impaired hearing.  Patient seen after a fall Monday night.  Patient does not recall the fall but woke up on the floor in the morning.  He laid on the floor until approximately noon Tuesday, when a neighbor checked on him and helped him to the bed.  Patient laid in bed for the next 24 hours attempting to recover, however continue to mount increasing pain.  Pain mostly in the patient's back, left buttock, left shoulder.  Pain is nonradiating and is severe.  No palliating or provoking factors.  Patient has not been taking his pain medication since the fall.    PT Comments    REASSESSMENT:  Patient status fall while on 300 resulting in right wrist fracture and presents very confused and disorientated.  Patient requires max verbal/tactile cueing to participate, demonstrated poor return for using right platform walker secondary to picking up walker instead of leaning on it, easily distracted and attention mostly divided.  Patient tolerated sitting up in chair after therapy with nursing staff in room.  PLAN: Patient will benefit from continued physical therapy in hospital and recommended venue below to increase strength, balance, endurance for safe ADLs and gait.   Follow Up Recommendations  SNF;Supervision/Assistance - 24 hour;Supervision for mobility/OOB     Equipment Recommendations  None recommended by PT    Recommendations for Other Services       Precautions / Restrictions Precautions Precautions: Fall Restrictions Weight Bearing Restrictions: No    Mobility  Bed Mobility Overal bed mobility: Needs Assistance Bed Mobility: Supine to Sit Rolling: Min assist         General bed mobility  comments: labored movement  Transfers Overall transfer level: Needs assistance Equipment used: Right platform walker Transfers: Sit to/from Stand;Stand Pivot Transfers Sit to Stand: Mod assist Stand pivot transfers: Mod assist       General transfer comment: very unsteady on feet, with frequent near loss of balance  Ambulation/Gait Ambulation/Gait assistance: Mod assist Gait Distance (Feet): 18 Feet Assistive device: Right platform walker Gait Pattern/deviations: Decreased step length - right;Decreased step length - left;Decreased stride length;Staggering left;Staggering right Gait velocity: slow   General Gait Details: unsteady labored cadence with frequent near loss of balance, poor carry over for following instructions, tends to hold platform walker off floor instead of leaning on it   Stairs             Wheelchair Mobility    Modified Rankin (Stroke Patients Only)       Balance Overall balance assessment: Needs assistance Sitting-balance support: Feet supported;No upper extremity supported Sitting balance-Leahy Scale: Fair     Standing balance support: During functional activity;No upper extremity supported Standing balance-Leahy Scale: Poor Standing balance comment: fair using right platform walker                            Cognition Arousal/Alertness: Awake/alert Behavior During Therapy: Restless;Agitated;Impulsive Overall Cognitive Status: Impaired/Different from baseline Area of Impairment: Orientation;Attention;Following commands;Safety/judgement;Awareness;Problem solving                 Orientation Level: Person Current Attention Level: Divided   Following Commands: Follows one step commands inconsistently Safety/Judgement: Decreased awareness of safety;Decreased awareness of deficits  Problem Solving: Difficulty sequencing;Requires tactile cues;Requires verbal cues;Decreased initiation General Comments: Patient appears very  confused and requires Max verbal/tactile cueing to complete functional task      Exercises      General Comments        Pertinent Vitals/Pain Pain Assessment: No/denies pain    Home Living                      Prior Function            PT Goals (current goals can now be found in the care plan section) Acute Rehab PT Goals Patient Stated Goal: return home with friends to assist PT Goal Formulation: With patient Time For Goal Achievement: 05/04/19 Potential to Achieve Goals: Fair Progress towards PT goals: Progressing toward goals    Frequency    Min 3X/week      PT Plan      Co-evaluation              AM-PAC PT "6 Clicks" Mobility   Outcome Measure  Help needed turning from your back to your side while in a flat bed without using bedrails?: A Little Help needed moving from lying on your back to sitting on the side of a flat bed without using bedrails?: A Little Help needed moving to and from a bed to a chair (including a wheelchair)?: A Lot Help needed standing up from a chair using your arms (e.g., wheelchair or bedside chair)?: A Lot Help needed to walk in hospital room?: A Lot Help needed climbing 3-5 steps with a railing? : Total 6 Click Score: 13    End of Session Equipment Utilized During Treatment: Gait belt Activity Tolerance: Patient limited by fatigue;Treatment limited secondary to agitation Patient left: in chair;with call bell/phone within reach;with nursing/sitter in room Nurse Communication: Mobility status PT Visit Diagnosis: Unsteadiness on feet (R26.81);Other abnormalities of gait and mobility (R26.89);Muscle weakness (generalized) (M62.81)     Time: [id number] PT Time Calculation (min) (ACUTE ONLY): 27 min  Charges:  $Therapeutic Activity: 23-37 mins                     1:51 PM, 04/24/19 Ocie BobJames Tristan Proto, MPT Physical Therapist [facility] [phone] office [phone] mobile phone

## 2019-04-24 NOTE — Progress Notes (Signed)
PT Cancellation Note  Patient Details Name: [name] MRN: [id number] DOB: 06/18/1951   Cancelled Treatment:    Reason Eval/Treat Not Completed: Medical issues which prohibited therapy.  Patient transferred to a higher level of care and will need new PT consult resume therapy when patient is medically stable.  Thank you.   9:04 AM, 04/24/19 Ocie Bob, MPT Physical Therapist with [facility] [phone] office [phone] mobile phone

## 2019-04-24 NOTE — Progress Notes (Signed)
[facility]  This change in his mental status is NOT narcotic withdrawal. The patient has been receiving narcotics since the day he was admitted. I feel he has developed a delirium which may simply be related to his hospital stay. Will try antipsychotics. transfer out of ICU.  Full note to follow.   Calvert Cantor, MD

## 2019-04-24 NOTE — Progress Notes (Signed)
Second dose of Dilaudid given per MD order. Will continue to monitor pt

## 2019-04-24 NOTE — Progress Notes (Signed)
PROGRESS NOTE    [name]   WUJ:[id number]  DOB: [birth year]  DOA: 04/19/2019 PCP: Gareth Morgan, MD   Brief Narrative:  [name] is a 68 y.o. male with a history of diffuse idiopathic skeletal hyperostosis (DISH), chronic pain management, GERD, impaired hearing. He presents after being found on the floor. The patient states that the room he was found in is not a room that he routinely goes into and he does not recall walking into this room. He was laying on his left side when he was found and was noted to have severe left arm swelling and pain in the ED along with difficulty moving the arm. He was also noted to have rhabdomyolysis and AKI.    Subjective: He has become increasingly confused since yesterday and today is not able to tell me where he is.     Assessment & Plan:   Principal Problem:   Rhabdomyolysis/ elevated LFTs - likely due to fall and trauma to his left arm and leg hip - CK has improved    Active Problems: Acute encephalopathy - will check UA to make sure he has not developed a UTI - he may have benzodiazepine withdrawal- cont Xanax and Ativan PRN  Fall on 5/17 with wrist fracture - he got up from the chair on 5/17 without assistance and fell  - he developed an abrasion on his forehead- head CT was negative x 2 - he also developed a fracture of the right wrist >Comminuted and dorsally impacted distal right radius fracture with DRU involvement. 2. Ulnar styloid fracture.   Syncope at home - cause is undetermined- he does not recall even entering the room where he was found on the floor- he states he had not taken too much pain medications - head CT is unrevealing- I did order an MRI however, he was too big for the MRI machine.  Trauma to left arm - significant left arm swelling due to laying on the arm - continue conservative management- no signs of compartment syndrome- swelling continues to improve and he is gaining more motion   Lower back pain  after a fall 3 wks ago - has midline lumbar pain without radiation to his legs - xray of L spine > Stable lucency at T12-L1 - Unable to obtain MRI as mentioned above - cont pain control    AKI (acute kidney injury)   - due to rhabdomyolysis vs dehydration  - is improved with IVF  Hypokalemia - replaced    DISH (diffuse idiopathic skeletal hyperostosis)   Chronic pain - cont narcotics for pain> Neurotntin, MS Contin, Oxycodone, Cymbalta  - have added Lidoderm patch in attempt to limit narcotics   Time spent in minutes: 35 DVT prophylaxis: Lovenox Code Status: Full code Family Communication:  Disposition Plan:  Awaiting SNF Consultants:   none Procedures:   none Antimicrobials:  Anti-infectives (From admission, onward)   None       Objective: Vitals:   04/24/19 0505 04/24/19 0728 04/24/19 0800 04/24/19 1113  BP: (!) 139/59  121/74   Pulse: 91 (!) 110 (!) 112   Resp: 15 (!) 26 18   Temp:  98.8 F (37.1 C)  97.7 F (36.5 C)  TempSrc:  Oral  Oral  SpO2: 99% 99% 98%   Weight:      Height:        Intake/Output Summary (Last 24 hours) at 04/24/2019 1444 Last data filed at 04/23/2019 2300 Gross per 24 hour  Intake  100 ml  Output -  Net 100 ml   Filed Weights   04/19/19 1326 04/23/19 2159 04/24/19 0500  Weight: 99.8 kg 112 kg 111.2 kg    Examination: General exam: Appears comfortable but is very confused today HEENT: PERRLA, oral mucosa moist, no sclera icterus or thrush Respiratory system: Clear to auscultation. Respiratory effort normal. Cardiovascular system: S1 & S2 heard,  No murmurs  Gastrointestinal system: Abdomen soft, non-tender, nondistended. Normal bowel sounds   Central nervous system: Alert and oriented only to person. No focal neurological deficits. Extremities: No cyanosis, clubbing or edema Skin: No rashes or ulcers Psychiatry:  Mood & affect appropriate.   Data Reviewed: I have personally reviewed following labs and imaging studies   CBC: Recent Labs  Lab 04/19/19 1421 04/20/19 0600 04/24/19 0357  WBC 15.0* 8.5 13.2*  NEUTROABS 12.7*  --   --   HGB 14.3 13.1 12.4*  HCT 41.7 40.4 36.3*  MCV 86.0 90.4 87.5  PLT 309 250 259   Basic Metabolic Panel: Recent Labs  Lab 04/19/19 1421 04/19/19 2011 04/20/19 0600 04/20/19 0815 04/20/19 1322 04/22/19 0610  NA 132*  --  139  --  136 142  K 3.7  --  3.4*  --  3.8 3.5  CL 93*  --  99  --  102 104  CO2 25  --  27  --  25 26  GLUCOSE 132*  --  113*  --  113* 138*  BUN 52*  --  39*  --  32* 18  CREATININE 2.52* 2.20* 1.83*  --  1.53* 1.13  CALCIUM 8.4*  --  8.3*  --  8.2* 8.2*  MG  --   --   --  2.0  --   --    GFR: Estimated Creatinine Clearance: 74.6 mL/min (by C-G formula based on SCr of 1.13 mg/dL). Liver Function Tests: Recent Labs  Lab 04/19/19 1421 04/20/19 0600 04/20/19 1322 04/22/19 1157  AST 264* 277* 277* 193*  ALT 107* 112* 111* 112*  ALKPHOS 131* 109 100 104  BILITOT 0.7 0.7 0.7 0.7  PROT 6.7 6.5 5.9* 6.5  ALBUMIN 3.1* 2.9* 2.6* 2.8*   No results for input(s): LIPASE, AMYLASE in the last 168 hours. No results for input(s): AMMONIA in the last 168 hours. Coagulation Profile: No results for input(s): INR, PROTIME in the last 168 hours. Cardiac Enzymes: Recent Labs  Lab 04/19/19 1421 04/19/19 2011 04/20/19 0600 04/21/19 0431 04/22/19 0610  CKTOTAL 9,598* 9,702* 9,086* 5,742* 3,618*   BNP (last 3 results) No results for input(s): PROBNP in the last 8760 hours. HbA1C: No results for input(s): HGBA1C in the last 72 hours. CBG: No results for input(s): GLUCAP in the last 168 hours. Lipid Profile: No results for input(s): CHOL, HDL, LDLCALC, TRIG, CHOLHDL, LDLDIRECT in the last 72 hours. Thyroid Function Tests: No results for input(s): TSH, T4TOTAL, FREET4, T3FREE, THYROIDAB in the last 72 hours. Anemia Panel: No results for input(s): VITAMINB12, FOLATE, FERRITIN, TIBC, IRON, RETICCTPCT in the last 72 hours. Urine analysis:     Component Value Date/Time   COLORURINE YELLOW 07/11/2007 0010   APPEARANCEUR CLEAR 07/11/2007 0010   LABSPEC 1.015 07/11/2007 0010   PHURINE 8.5 (H) 07/11/2007 0010   GLUCOSEU NEGATIVE 07/11/2007 0010   HGBUR TRACE (A) 07/11/2007 0010   BILIRUBINUR NEGATIVE 07/11/2007 0010   KETONESUR NEGATIVE 07/11/2007 0010   PROTEINUR NEGATIVE 07/11/2007 0010   UROBILINOGEN 0.2 07/11/2007 0010   NITRITE NEGATIVE 07/11/2007 0010   LEUKOCYTESUR  NEGATIVE 07/11/2007 0010   Sepsis Labs: (procalcitonin:4,lacticidven:4) ) Recent Results (from the past 240 hour(s))  SARS Coronavirus 2 (CEPHEID - Performed in [facility] hospital lab), Hosp Order     Status: None   Collection Time: 04/19/19  4:26 PM  Result Value Ref Range Status   SARS Coronavirus 2 NEGATIVE NEGATIVE Final    Comment: (NOTE) If result is NEGATIVE SARS-CoV-2 target nucleic acids are NOT DETECTED. The SARS-CoV-2 RNA is generally detectable in upper and lower  respiratory specimens during the acute phase of infection. The lowest  concentration of SARS-CoV-2 viral copies this assay can detect is 250  copies / mL. A negative result does not preclude SARS-CoV-2 infection  and should not be used as the sole basis for treatment or other  patient management decisions.  A negative result may occur with  improper specimen collection / handling, submission of specimen other  than nasopharyngeal swab, presence of viral mutation(s) within the  areas targeted by this assay, and inadequate number of viral copies  (<250 copies / mL). A negative result must be combined with clinical  observations, patient history, and epidemiological information. If result is POSITIVE SARS-CoV-2 target nucleic acids are DETECTED. The SARS-CoV-2 RNA is generally detectable in upper and lower  respiratory specimens dur ing the acute phase of infection.  Positive  results are indicative of active infection with SARS-CoV-2.  Clinical  correlation with  patient history and other diagnostic information is  necessary to determine patient infection status.  Positive results do  not rule out bacterial infection or co-infection with other viruses. If result is PRESUMPTIVE POSTIVE SARS-CoV-2 nucleic acids MAY BE PRESENT.   A presumptive positive result was obtained on the submitted specimen  and confirmed on repeat testing.  While 2019 novel coronavirus  (SARS-CoV-2) nucleic acids may be present in the submitted sample  additional confirmatory testing may be necessary for epidemiological  and / or clinical management purposes  to differentiate between  SARS-CoV-2 and other Sarbecovirus currently known to infect humans.  If clinically indicated additional testing with an alternate test  methodology [phone]) is advised. The SARS-CoV-2 RNA is generally  detectable in upper and lower respiratory sp ecimens during the acute  phase of infection. The expected result is Negative. Fact Sheet for Patients:  BoilerBrush.com.cy Fact Sheet for Healthcare Providers: https://pope.com/ This test is not yet approved or cleared by the [place] FDA and has been authorized for detection and/or diagnosis of SARS-CoV-2 by FDA under an Emergency Use Authorization (EUA).  This EUA will remain in effect (meaning this test can be used) for the duration of the COVID-19 declaration under Section 564(b)(1) of the Act, 21 U.S.C. section 360bbb-3(b)(1), unless the authorization is terminated or revoked sooner. Performed at [facility], [address] [place], [place] [address]   MRSA PCR Screening     Status: None   Collection Time: 04/23/19 10:32 PM  Result Value Ref Range Status   MRSA by PCR NEGATIVE NEGATIVE Final    Comment:        The GeneXpert MRSA Assay (FDA approved for NASAL specimens only), is one component of a comprehensive MRSA colonization surveillance program. It is not intended to diagnose  MRSA infection nor to guide or monitor treatment for MRSA infections. Performed at [facility], [address], [place], [place] [address]          Radiology Studies: Dg Wrist Complete Right  Result Date: 04/23/2019 CLINICAL DATA:  68 year old male status post fall with  pain and swelling. EXAM: RIGHT HAND - 2 VIEW; RIGHT WRIST - COMPLETE 3+ VIEW COMPARISON:  None. FINDINGS: Right hand: Distal right radius and ulna fracture described below. No carpal or metacarpal fracture identified. No phalanx fracture identified. Generalized soft tissue swelling. Right wrist: Comminuted and dorsally impacted distal right radius fracture with DRU involvement. No definite radiocarpal joint involvement. Mildly comminuted and displaced ulnar styloid fracture. Carpal bone alignment is maintained and no carpal bone fracture is identified. Generalized soft tissue swelling. IMPRESSION: 1. Comminuted and dorsally impacted distal right radius fracture with DRU involvement. 2. Ulnar styloid fracture. 3. No other acute fracture or dislocation identified about the right hand or wrist. Electronically Signed   By: Odessa Fleming M.D.   On: 04/23/2019 22:12   Ct Head Wo Contrast  Result Date: 04/23/2019 CLINICAL DATA:  68 year old male new onset vomiting after a fall. EXAM: CT HEAD WITHOUT CONTRAST TECHNIQUE: Contiguous axial images were obtained from the base of the skull through the vertex without intravenous contrast. COMPARISON:  Head CT 1609 hours earlier today.  04/19/2019. FINDINGS: Brain: Less motion artifact. Cerebral volume is within normal limits for age. No midline shift, mass effect, or evidence of intracranial mass lesion. No ventriculomegaly. Incidental dural calcifications. No acute intracranial hemorrhage identified. No cortically based acute infarct identified. Normal for age gray-white matter differentiation. Vascular: No suspicious intracranial vascular hyperdensity. Skull: No acute osseous abnormality identified.  Sinuses/Orbits: Visualized paranasal sinuses and mastoids are stable and well pneumatized. Other: No scalp hematoma identified. No acute orbit or scalp soft tissue finding identified. IMPRESSION: Less motion degraded, stable an and normal for age noncontrast CT appearance of the brain. Electronically Signed   By: Odessa Fleming M.D.   On: 04/23/2019 21:31   Ct Head Wo Contrast  Result Date: 04/23/2019 CLINICAL DATA:  Found on floor EXAM: CT HEAD WITHOUT CONTRAST TECHNIQUE: Contiguous axial images were obtained from the base of the skull through the vertex without intravenous contrast. COMPARISON:  04/19/2019 FINDINGS: Examination is significantly limited by patient motion artifact throughout. Brain: No evidence of acute infarction, hemorrhage, hydrocephalus, extra-axial collection or mass lesion/mass effect. Vascular: No hyperdense vessel or unexpected calcification. Skull: Normal. Negative for fracture or focal lesion. Sinuses/Orbits: No acute finding. Other: None. IMPRESSION: Examination is significantly limited by patient motion artifact throughout. Within this limitation, no acute intracranial pathology. Electronically Signed   By: Lauralyn Primes M.D.   On: 04/23/2019 16:56   Dg Hand 2 View Right  Result Date: 04/23/2019 CLINICAL DATA:  69 year old male status post fall with pain and swelling. EXAM: RIGHT HAND - 2 VIEW; RIGHT WRIST - COMPLETE 3+ VIEW COMPARISON:  None. FINDINGS: Right hand: Distal right radius and ulna fracture described below. No carpal or metacarpal fracture identified. No phalanx fracture identified. Generalized soft tissue swelling. Right wrist: Comminuted and dorsally impacted distal right radius fracture with DRU involvement. No definite radiocarpal joint involvement. Mildly comminuted and displaced ulnar styloid fracture. Carpal bone alignment is maintained and no carpal bone fracture is identified. Generalized soft tissue swelling. IMPRESSION: 1. Comminuted and dorsally impacted distal  right radius fracture with DRU involvement. 2. Ulnar styloid fracture. 3. No other acute fracture or dislocation identified about the right hand or wrist. Electronically Signed   By: Odessa Fleming M.D.   On: 04/23/2019 22:12      Scheduled Meds: . Chlorhexidine Gluconate Cloth  6 each Topical Q0600  . DULoxetine  60 mg Oral BID  . enoxaparin (LOVENOX) injection  50 mg Subcutaneous Q24H  .  gabapentin  600 mg Oral QHS  . lidocaine  1 patch Transdermal Q24H  . mouth rinse  15 mL Mouth Rinse BID  . morphine  15 mg Oral BID  . pantoprazole  40 mg Oral Daily  . polyethylene glycol  17 g Oral Daily  . QUEtiapine  12.5 mg Oral BID   Continuous Infusions:    LOS: 5 days      Calvert CantorSaima Roselynne Lortz, MD [facility] Pager: [email or web address] Password [email or web address] 04/24/2019, 2:44 PM

## 2019-04-24 NOTE — Progress Notes (Signed)
Patient refusing to take Tylenol for fever. MD aware.

## 2019-04-24 NOTE — Care Management Important Message (Signed)
Important Message  Patient Details  Name: [name] MRN: [id number] Date of Birth: 07/05/1951   Medicare Important Message Given:  Yes    Corey Harold 04/24/2019, 3:33 PM

## 2019-04-24 NOTE — Clinical Social Work Note (Signed)
PT alerted CSW that recommendation is now shifted to SNF for rehab.  After observing patient interacting with others and consulting with nursing, it would appear patient currently lacks capacity to make decisions about discharge plans.  Will follow up tomorrow.

## 2019-04-24 NOTE — Progress Notes (Signed)
Call placed to Dr. Mort Sawyers office, spoke with Okey Regal who will let him know about the consult.

## 2019-04-24 NOTE — Progress Notes (Signed)
Upon assessment of patient, RN noted patient to be confused and unable to follow commands.  Patient moaned in pain when RN touched his right hand which RN noted to be swollen.  Patient's pupils were dilated to 6 mm and sluggish.  Patient began grunting and vomiting and speech became incomprehensible.  RN paged Dr. Robb Matar to room to assess patient.  Dr. Robb Matar arrived and orders received, including patient to have repeat head CT and transfer to ICU.  RN accompanied patient to CT and also X-ray for hand and wrist X-rays.  Patient transferred to ICU, report given to A. Mattie Marlin, RN

## 2019-04-24 NOTE — Progress Notes (Signed)
Patient refusing to keep on telemetry monitor. Staff unable to to redirect patient at this time. Will continue to monitor.  Quita Skye, RN

## 2019-04-24 NOTE — Progress Notes (Signed)
Pt starting to get very agitated and anxious. Pt pulling off leads, pulse ox, and trying to pull off name bands/fall bracelet. Given Haldol 2mg  per PRN order. Condom catheter came off- bed changed and condom catheter replaced. Will continue to monitor pt.

## 2019-04-24 NOTE — Progress Notes (Signed)
Pts COWS assessment=23 (Moderate opioid withdrawal) Dr. Robb Matar made aware of pts behavior- Dr. Robb Matar in to see patient. Adv RN thinks patient is going through some withdrawal of morphine and Oxycodone and pt is known to abuse the medication. Pt continuously pulling at leads, mitts, pt continuously stating he needs to leave.  "See I'm all backwards. I just can't stop", is what patient keeps repeating. Pupils 4, round reactive. Pt very fidgety, sweaty, and can not stay still. Given dilaudid 1mg  per MD order. Will continue to monitor pt

## 2019-04-24 NOTE — Progress Notes (Signed)
Patient refusing telemetry monitoring. Staff unable to redirect patient at this time, will attempt later on.   Quita Skye, RN

## 2019-04-25 DIAGNOSIS — S52531A Colles' fracture of right radius, initial encounter for closed fracture: Secondary | ICD-10-CM

## 2019-04-25 LAB — CBC
HCT: 35.4 % — ABNORMAL LOW (ref 39.0–52.0)
Hemoglobin: 11.6 g/dL — ABNORMAL LOW (ref 13.0–17.0)
MCH: 29.4 pg (ref 26.0–34.0)
MCHC: 32.8 g/dL (ref 30.0–36.0)
MCV: 89.6 fL (ref 80.0–100.0)
Platelets: 197 10*3/uL (ref 150–400)
RBC: 3.95 MIL/uL — ABNORMAL LOW (ref 4.22–5.81)
RDW: 13.2 % (ref 11.5–15.5)
WBC: 9.4 10*3/uL (ref 4.0–10.5)
nRBC: 0 % (ref 0.0–0.2)

## 2019-04-25 LAB — BASIC METABOLIC PANEL
Anion gap: 10 (ref 5–15)
BUN: 22 mg/dL (ref 8–23)
CO2: 28 mmol/L (ref 22–32)
Calcium: 8.1 mg/dL — ABNORMAL LOW (ref 8.9–10.3)
Chloride: 106 mmol/L (ref 98–111)
Creatinine, Ser: 0.97 mg/dL (ref 0.61–1.24)
GFR calc Af Amer: >60 mL/min (ref >60)
GFR calc non Af Amer: >60 mL/min (ref >60)
Glucose, Bld: 94 mg/dL (ref 70–99)
Potassium: 3.1 mmol/L — ABNORMAL LOW (ref 3.5–5.1)
Sodium: 144 mmol/L (ref 135–145)

## 2019-04-25 MED ORDER — SODIUM CHLORIDE 0.9 % IV SOLN
1.0000 g | INTRAVENOUS | Status: DC
  Administered 2019-04-25 – 2019-04-26 (×2): 1 g via INTRAVENOUS
  Filled 2019-04-25 (×2): qty 10

## 2019-04-25 MED ORDER — POTASSIUM CHLORIDE CRYS ER 20 MEQ PO TBCR
40.0000 meq | EXTENDED_RELEASE_TABLET | ORAL | Status: AC
  Administered 2019-04-25 (×2): 40 meq via ORAL
  Filled 2019-04-25 (×2): qty 2

## 2019-04-25 NOTE — TOC Progression Note (Signed)
Transition of Care [facility]) - Progression Note    Patient Details  Name: [name] MRN: [id number] Date of Birth: 20-Mar-1951  [facility] CM/SW Contact  Shade Flood, LCSW Phone Number: 04/25/2019, 1:05 PM  Clinical Narrative:     Met with pt today to discuss SNF bed offers. Pt initially stated that he wants to go home with [facility] and help from his friend [name] but he ultimately agreed with the recommendations of MD and PT along with his Sig other and [name] that it would be better for him to go to SNF short term for rehab. Pt states he would like to go to [facility]. Updated [name] at [facility]. She will work on Arboriculturist. She states negative COVID of 04/19/19 should be sufficient. DC date not yet known. TOC will follow.  Expected Discharge Plan: [place] Barriers to Discharge: No Barriers Identified  Expected Discharge Plan and Services Expected Discharge Plan: [place] In-house Referral: Clinical Social Work Discharge Planning Services: CM Consult Post Acute Care Choice: [place] arrangements for the past 2 months: Single Family Home                 DME Arranged: Hospital bed DME Agency: [employer] Date DME Agency Contacted: 04/21/19 Time DME Agency Contacted: 1520 Representative spoke with at DME Agency: [name]  HH Arranged: RN, PT, Nurse's Aide [place] Agency: [place] ([place]) Date [place]: 04/20/19 Time [place]: [name] Representative spoke with at [place]: [name] ([place]) Interventions    Readmission Risk Interventions No flowsheet data found.

## 2019-04-25 NOTE — Progress Notes (Addendum)
PROGRESS NOTE    [name]   ZOX:[id number]  DOB: 1951/08/16  DOA: 04/19/2019 PCP: Gareth Morgan, MD   Brief Narrative:  [name] is a 68 y.o. male with a history of diffuse idiopathic skeletal hyperostosis (DISH), chronic pain management, GERD, impaired hearing. He presents after being found on the floor. The patient states that the room he was found in is not a room that he routinely goes into and he does not recall walking into this room. He was laying on his left side when he was found and was noted to have severe left arm swelling and pain in the ED along with difficulty moving the arm. He was also noted to have rhabdomyolysis and AKI.  Per his POA and his caretaker, she thinks he takes more narcotics and benzodiazepine then prescribed and this may have resulted in his fall and the fact that he has no memory of it.    Subjective: He remains confused. No complaints.     Assessment & Plan:   Principal Problem:   Rhabdomyolysis/ elevated LFTs - likely due to fall and trauma to his left arm and leg hip - CK has improved    Active Problems: Acute encephalopathy beginning on the morning 5/17 - he may have delirium related to benzodiazepine withdrawal if he was taking more Xanax than prescribed at home-  cont Xanax and Ativan PRN - he is also found to have a UTI and had a fever of 102  which could be contributing to his severe confusion- start Ceftriaxone and follow urine culture  Fall on 5/17 with right wrist fracture - he began to get confused on 5/17- while being confused, he got up from the chair on 5/17 (without assistance) when no one was in the room and fell  - he developed an abrasion on his forehead- head CT was negative x 2 - he also developed a fracture of the right wrist - Xray>>Comminuted and dorsally impacted distal right radius fracture with DRU involvement and Ulnar styloid fracture.  -Ortho recommends splinting and then a cast in 2 wks when swelling  decreases- also a platform walker  Syncope at home - cause is undetermined- he does not recall even entering the room where he was found on the floor- he states he had not taken too much pain medications but his POA (who does not live with him) says he takes too much pain medication and is often very sleepy - POA also states this is the second fall in the past few weeks. After the first fall, he is no longer able to get up without having severe back pain - head CT is unrevealing - I did order an MRI on admission however, he was too big for the MRI machine.  Trauma to left arm - significant left arm upper swelling due to laying on the arm - continue conservative management- no signs of compartment syndrome- swelling continues to improve and he is gaining more motion   Lower back pain after a fall 3 wks ago - has midline lumbar pain without radiation to his legs - xray of L spine > Stable lucency at T12-L1 - Unable to obtain MRI as mentioned above - cont pain control    AKI (acute kidney injury)   - due to rhabdomyolysis vs dehydration  - is improved with IVF  Hypokalemia - replaced    DISH (diffuse idiopathic skeletal hyperostosis)   Chronic pain - cont  Home doses of narcotics for pain>  Neurotntin, MS Contin, Oxycodone, Cymbalta  - have added Lidoderm patch in attempt to limit narcotics   Time spent in minutes: 35 DVT prophylaxis: Lovenox Code Status: Full code Family Communication:  Disposition Plan:  Will go to SNF once delirium improves Consultants:   ortho Procedures:   none Antimicrobials:  Anti-infectives (From admission, onward)   None       Objective: Vitals:   04/25/19 0752 04/25/19 0800 04/25/19 0802 04/25/19 0900  BP:  121/82  (!) 124/59  Pulse: 90 (!) 108  (!) 105  Resp: (!) 28 (!) 32 18 (!) 21  Temp: 99.5 F (37.5 C)     TempSrc: Axillary     SpO2: 93% 97%  100%  Weight:      Height:       No intake or output data in the 24 hours ending  04/25/19 1052 Filed Weights   04/23/19 2159 04/24/19 0500 04/25/19 0500  Weight: 112 kg 111.2 kg 107.8 kg    Examination: General exam: Appears comfortable  HEENT: PERRLA, oral mucosa moist, no sclera icterus or thrush Respiratory system: Clear to auscultation. Respiratory effort normal. Cardiovascular system: S1 & S2 [name],  No murmurs  Gastrointestinal system: Abdomen soft, non-tender, nondistended. Normal bowel sounds   Central nervous system: Alert and oriented only to person. No focal neurological deficits. Extremities: No cyanosis, clubbing or edema Skin: No rashes or ulcers Psychiatry:  Mood & affect appropriate.    Data Reviewed: I have personally reviewed following labs and imaging studies  CBC: Recent Labs  Lab 04/19/19 1421 04/20/19 0600 04/24/19 0357 04/25/19 0227  WBC 15.0* 8.5 13.2* 9.4  NEUTROABS 12.7*  --   --   --   HGB 14.3 13.1 12.4* 11.6*  HCT 41.7 40.4 36.3* 35.4*  MCV 86.0 90.4 87.5 89.6  PLT 309 250 259 197   Basic Metabolic Panel: Recent Labs  Lab 04/20/19 0600 04/20/19 0815 04/20/19 1322 04/22/19 0610 04/24/19 1533 04/25/19 0227  NA 139  --  136 142 141 144  K 3.4*  --  3.8 3.5 3.4* 3.1*  CL 99  --  102 104 99 106  CO2 27  --  GLUCOSE 113*  --  113* 138* 107* 94  BUN 39*  --  32* CREATININE 1.83*  --  1.53* 1.13 1.06 0.97  CALCIUM 8.3*  --  8.2* 8.2* 8.4* 8.1*  MG  --  2.0  --   --   --   --    GFR: Estimated Creatinine Clearance: 85.6 mL/min (by C-G formula based on SCr of 0.97 mg/dL). Liver Function Tests: Recent Labs  Lab 04/19/19 1421 04/20/19 0600 04/20/19 1322 04/22/19 1157  AST 264* 277* 277* 193*  ALT 107* 112* 111* 112*  ALKPHOS 131* 109 100 104  BILITOT 0.7 0.7 0.7 0.7  PROT 6.7 6.5 5.9* 6.5  ALBUMIN 3.1* 2.9* 2.6* 2.8*   No results for input(s): LIPASE, AMYLASE in the last 168 hours. No results for input(s): AMMONIA in the last 168 hours. Coagulation Profile: No results for input(s): INR,  PROTIME in the last 168 hours. Cardiac Enzymes: Recent Labs  Lab 04/19/19 1421 04/19/19 2011 04/20/19 0600 04/21/19 0431 04/22/19 0610  CKTOTAL 9,598* 9,702* 9,086* 5,742* 3,618*   BNP (last 3 results) No results for input(s): PROBNP in the last 8760 hours. HbA1C: No results for input(s): HGBA1C in the last 72 hours. CBG: No results for input(s): GLUCAP in the last 168  hours. Lipid Profile: No results for input(s): CHOL, HDL, LDLCALC, TRIG, CHOLHDL, LDLDIRECT in the last 72 hours. Thyroid Function Tests: No results for input(s): TSH, T4TOTAL, FREET4, T3FREE, THYROIDAB in the last 72 hours. Anemia Panel: No results for input(s): VITAMINB12, FOLATE, FERRITIN, TIBC, IRON, RETICCTPCT in the last 72 hours. Urine analysis:    Component Value Date/Time   COLORURINE YELLOW 04/24/2019 1459   APPEARANCEUR HAZY (A) 04/24/2019 1459   LABSPEC 1.017 04/24/2019 1459   PHURINE 6.0 04/24/2019 1459   GLUCOSEU NEGATIVE 04/24/2019 1459   HGBUR MODERATE (A) 04/24/2019 1459   BILIRUBINUR NEGATIVE 04/24/2019 1459   KETONESUR NEGATIVE 04/24/2019 1459   PROTEINUR 30 (A) 04/24/2019 1459   UROBILINOGEN 0.2 07/11/2007 0010   NITRITE NEGATIVE 04/24/2019 1459   LEUKOCYTESUR MODERATE (A) 04/24/2019 1459   Sepsis Labs: @LABRCNTIP (procalcitonin:4,lacticidven:4) ) Recent Results (from the past 240 hour(s))  SARS Coronavirus 2 (CEPHEID - [facility]), Hosp Order     Status: None   Collection Time: 04/19/19  4:26 PM  Result Value Ref Range Status   SARS Coronavirus 2 NEGATIVE NEGATIVE Final    Comment: (NOTE) If result is NEGATIVE SARS-CoV-2 target nucleic acids are NOT DETECTED. The SARS-CoV-2 RNA is generally detectable in upper and lower  respiratory specimens during the acute phase of infection. The lowest  concentration of SARS-CoV-2 viral copies this assay can detect is 250  copies / mL. A negative result does not preclude SARS-CoV-2 infection  and should not be used  as the sole basis for treatment or other  patient management decisions.  A negative result may occur with  improper specimen collection / handling, submission of specimen other  than nasopharyngeal swab, presence of viral mutation(s) within the  areas targeted by this assay, and inadequate number of viral copies  (<250 copies / mL). A negative result must be combined with clinical  observations, patient history, and epidemiological information. If result is POSITIVE SARS-CoV-2 target nucleic acids are DETECTED. The SARS-CoV-2 RNA is generally detectable in upper and lower  respiratory specimens dur ing the acute phase of infection.  Positive  results are indicative of active infection with SARS-CoV-2.  Clinical  correlation with patient history and other diagnostic information is  necessary to determine patient infection status.  Positive results do  not rule out bacterial infection or co-infection with other viruses. If result is PRESUMPTIVE POSTIVE SARS-CoV-2 nucleic acids MAY BE PRESENT.   A presumptive positive result was obtained on the submitted specimen  and confirmed on repeat testing.  While 2019 novel coronavirus  (SARS-CoV-2) nucleic acids may be present in the submitted sample  additional confirmatory testing may be necessary for epidemiological  and / or clinical management purposes  to differentiate between  SARS-CoV-2 and other Sarbecovirus currently known to infect humans.  If clinically indicated additional testing with an alternate test  methodology [phone]([id number]) is advised. The SARS-CoV-2 RNA is generally  detectable in upper and lower respiratory sp ecimens during the acute  phase of infection. The expected result is Negative. Fact Sheet for Patients:  BoilerBrush.com.cyhttps://www.fda.gov/media/136312/download Fact Sheet for Healthcare Providers: https://pope.com/https://www.fda.gov/media/136313/download This test is not yet approved or cleared by the [place] FDA and has been authorized for  detection and/or diagnosis of SARS-CoV-2 by FDA under an Emergency Use Authorization (EUA).  This EUA will remain in effect (meaning this test can be used) for the duration of the COVID-19 declaration under Section 564(b)(1) of the Act, 21 U.S.C. section 360bbb-3(b)(1), unless the authorization is terminated  or revoked sooner. Performed at [facility], [address], [place], [place] [address]   MRSA PCR Screening     Status: None   Collection Time: 04/23/19 10:32 PM  Result Value Ref Range Status   MRSA by PCR NEGATIVE NEGATIVE Final    Comment:        The GeneXpert MRSA Assay (FDA approved for NASAL specimens only), is one component of a comprehensive MRSA colonization surveillance program. It is not intended to diagnose MRSA infection nor to guide or monitor treatment for MRSA infections. Performed at [facility], [address] [place], [place] [address]   Culture, blood (Routine X 2) w Reflex to ID Panel     Status: None (Preliminary result)   Collection Time: 04/24/19  4:53 PM  Result Value Ref Range Status   Specimen Description BLOOD LEFT HAND  Final   Special Requests   Final    BOTTLES DRAWN AEROBIC AND ANAEROBIC Blood Culture adequate volume   Culture   Final    NO GROWTH < 12 HOURS Performed at [facility], [address] [place], [place] [address]    Report Status PENDING  Incomplete  Culture, blood (Routine X 2) w Reflex to ID Panel     Status: None (Preliminary result)   Collection Time: 04/25/19  2:27 AM  Result Value Ref Range Status   Specimen Description BLOOD LEFT HAND  Final   Special Requests   Final    BOTTLES DRAWN AEROBIC AND ANAEROBIC Blood Culture adequate volume   Culture   Final    NO GROWTH < 12 HOURS Performed at [facility], [address], [place], [place] [address]    Report Status PENDING  Incomplete         Radiology Studies: Dg Wrist Complete Right  Result Date: 04/23/2019 CLINICAL DATA:  68 year old male status post fall  with pain and swelling. EXAM: RIGHT HAND - 2 VIEW; RIGHT WRIST - COMPLETE 3+ VIEW COMPARISON:  None. FINDINGS: Right hand: Distal right radius and ulna fracture described below. No carpal or metacarpal fracture identified. No phalanx fracture identified. Generalized soft tissue swelling. Right wrist: Comminuted and dorsally impacted distal right radius fracture with DRU involvement. No definite radiocarpal joint involvement. Mildly comminuted and displaced ulnar styloid fracture. Carpal bone alignment is maintained and no carpal bone fracture is identified. Generalized soft tissue swelling. IMPRESSION: 1. Comminuted and dorsally impacted distal right radius fracture with DRU involvement. 2. Ulnar styloid fracture. 3. No other acute fracture or dislocation identified about the right hand or wrist. Electronically Signed   By: Odessa Fleming M.D.   On: 04/23/2019 22:12   Ct Head Wo Contrast  Result Date: 04/23/2019 CLINICAL DATA:  68 year old male new onset vomiting after a fall. EXAM: CT HEAD WITHOUT CONTRAST TECHNIQUE: Contiguous axial images were obtained from the base of the skull through the vertex without intravenous contrast. COMPARISON:  Head CT 1609 hours earlier today.  04/19/2019. FINDINGS: Brain: Less motion artifact. Cerebral volume is within normal limits for age. No midline shift, mass effect, or evidence of intracranial mass lesion. No ventriculomegaly. Incidental dural calcifications. No acute intracranial hemorrhage identified. No cortically based acute infarct identified. Normal for age gray-white matter differentiation. Vascular: No suspicious intracranial vascular hyperdensity. Skull: No acute osseous abnormality identified. Sinuses/Orbits: Visualized paranasal sinuses and mastoids are stable and well pneumatized. Other: No scalp hematoma identified. No acute orbit or scalp soft tissue finding identified. IMPRESSION: Less motion degraded, stable an and normal for age noncontrast CT  appearance of the  brain. Electronically Signed   By: Odessa Fleming M.D.   On: 04/23/2019 21:31   Ct Head Wo Contrast  Result Date: 04/23/2019 CLINICAL DATA:  Found on floor EXAM: CT HEAD WITHOUT CONTRAST TECHNIQUE: Contiguous axial images were obtained from the base of the skull through the vertex without intravenous contrast. COMPARISON:  04/19/2019 FINDINGS: Examination is significantly limited by patient motion artifact throughout. Brain: No evidence of acute infarction, hemorrhage, hydrocephalus, extra-axial collection or mass lesion/mass effect. Vascular: No hyperdense vessel or unexpected calcification. Skull: Normal. Negative for fracture or focal lesion. Sinuses/Orbits: No acute finding. Other: None. IMPRESSION: Examination is significantly limited by patient motion artifact throughout. Within this limitation, no acute intracranial pathology. Electronically Signed   By: Lauralyn Primes M.D.   On: 04/23/2019 16:56   Dg Hand 2 View Right  Result Date: 04/23/2019 CLINICAL DATA:  68 year old male status post fall with pain and swelling. EXAM: RIGHT HAND - 2 VIEW; RIGHT WRIST - COMPLETE 3+ VIEW COMPARISON:  None. FINDINGS: Right hand: Distal right radius and ulna fracture described below. No carpal or metacarpal fracture identified. No phalanx fracture identified. Generalized soft tissue swelling. Right wrist: Comminuted and dorsally impacted distal right radius fracture with DRU involvement. No definite radiocarpal joint involvement. Mildly comminuted and displaced ulnar styloid fracture. Carpal bone alignment is maintained and no carpal bone fracture is identified. Generalized soft tissue swelling. IMPRESSION: 1. Comminuted and dorsally impacted distal right radius fracture with DRU involvement. 2. Ulnar styloid fracture. 3. No other acute fracture or dislocation identified about the right hand or wrist. Electronically Signed   By: Odessa Fleming M.D.   On: 04/23/2019 22:12      Scheduled Meds: . ALPRAZolam  0.5 mg Oral TID  .  Chlorhexidine Gluconate Cloth  6 each Topical Q0600  . DULoxetine  60 mg Oral BID  . enoxaparin (LOVENOX) injection  50 mg Subcutaneous Q24H  . gabapentin  600 mg Oral QHS  . lidocaine  1 patch Transdermal Q24H  . mouth rinse  15 mL Mouth Rinse BID  . morphine  15 mg Oral BID  . pantoprazole  40 mg Oral Daily  . polyethylene glycol  17 g Oral Daily  . potassium chloride  40 mEq Oral Q4H  . QUEtiapine  12.5 mg Oral BID   Continuous Infusions:    LOS: 6 days      Calvert Cantor, MD [facility] Pager: [email or web address] Password [facility] 04/25/2019, 10:52 AM

## 2019-04-25 NOTE — H&P (Signed)
History and Physical  [name] WUJ:[id number] DOB: Aug 24, 1951 DOA: 04/19/2019  Referring physician: Burgess Amor, Cordelia Poche, ED provider PCP: Gareth Morgan, MD  Outpatient Specialists:   Patient Coming From: Home  Chief Complaint: Generalized pain  HPI: [name] is a 68 y.o. male with a history of diffuse idiopathic skeletal hyperostosis (DISH), chronic pain management, GERD, impaired hearing.  Patient seen after a fall Monday night.  Patient does not recall the fall but woke up on the floor in the morning.  He laid on the floor until approximately noon Tuesday, when a neighbor checked on him and helped him to the bed.  Patient laid in bed for the next 24 hours attempting to recover, however continue to mount increasing pain.  Pain mostly in the patient's back, left buttock, left shoulder.  Pain is nonradiating and is severe.  No palliating or provoking factors.  Patient has not been taking his pain medication since the fall.  Emergency Department Course: Pelvis x-ray, shoulder x-ray, CT scan of head and neck all negative.  Creatinine elevated to 0.52 with elevated LFTs: AST 264 and ALT 107.  CK total is 9500.  Patient has a white blood cell count of 15.  Patient bolused with 1 L IV fluids and given IV pain management with morphine 4 mg and Dilaudid 1 mg.  Review of Systems:   Pt denies any fevers, chills, nausea, vomiting, diarrhea, constipation, abdominal pain, shortness of breath, dyspnea on exertion, orthopnea, cough, wheezing, palpitations, headache, vision changes, lightheadedness, dizziness, melena, rectal bleeding.  Review of systems are otherwise negative  Past Medical History:  Diagnosis Date   Cataract    Chronic pain    DISH (diffuse idiopathic skeletal hyperostosis)    GERD (gastroesophageal reflux disease)    Impaired hearing    Past Surgical History:  Procedure Laterality Date   BACK SURGERY     X2   CATARACT EXTRACTION W/PHACO  12/03/2011   Procedure:  CATARACT EXTRACTION PHACO AND INTRAOCULAR LENS PLACEMENT (IOC);  Surgeon: Gemma Payor;  Location: [facility];  Service: Ophthalmology;  Laterality: Right;  CDE: 7.86   CATARACT EXTRACTION W/PHACO  12/21/2011   Procedure: CATARACT EXTRACTION PHACO AND INTRAOCULAR LENS PLACEMENT (IOC);  Surgeon: Gemma Payor, MD;  Location: [facility];  Service: Ophthalmology;  Laterality: Left;  CDE=5.51   KNEE ARTHROSCOPY W/ ACL RECONSTRUCTION  05/2000   right, [facility]   KNEE SURGERY  10/2000   remove screw after ACL   LAMINECTOMY     TUMOR EXCISION     back, cyst   Social History:  reports that he has never smoked. His smokeless tobacco use includes chew. He reports that he does not drink alcohol or use drugs. Patient lives at home  Allergies  Allergen Reactions   Ceclor [Cefaclor] Other (See Comments)    Keeps patient wake for days.    Nsaids Other (See Comments)    G.I. Upset   Other Other (See Comments)    Steroids make patient extremely agitated     Family History  Problem Relation Age of Onset   Anesthesia problems Neg Hx    Hypotension Neg Hx    Malignant hyperthermia Neg Hx    Pseudochol deficiency Neg Hx       Prior to Admission medications   Medication Sig Start Date End Date Taking? Authorizing Provider  ALPRAZolam Prudy Feeler) 1 MG tablet Take 1.5 mg by mouth at bedtime as needed. Sleep     [provider]  azithromycin [facility])  250 MG tablet Take 1-2 tablets by mouth as directed. Patient takes 2 tablets by mouth on day 1 of treatment; on days 2-5, patient takes 1 tablet daily 04/17/19   [provider]  DULoxetine (CYMBALTA) 60 MG capsule Take 60 mg by mouth 2 (two) times daily.     [provider]  fluticasone (CUTIVATE) 0.05 % cream Apply 1 application topically daily as needed. 12/21/18   [provider]  gabapentin (NEURONTIN) 600 MG tablet Take 600 mg by mouth at bedtime.      [provider]  ketoconazole (NIZORAL) 2 % shampoo Apply 1  application topically daily as needed. 12/16/18   [provider]  lansoprazole (PREVACID) 30 MG capsule Take 30 mg by mouth daily.     [provider]  morphine (MS CONTIN) 15 MG 12 hr tablet Take 15 mg by mouth 2 (two) times daily.      [provider]  morphine (MS CONTIN) 60 MG 12 hr tablet Take 60 mg by mouth 2 (two) times daily.      [provider]  oxyCODONE (OXY IR/ROXICODONE) 5 MG immediate release tablet Take 5 mg by mouth 3 (three) times daily.      [provider]  sildenafil (VIAGRA) 100 MG tablet Take 1 tablet by mouth as directed. 01/17/19   [provider]  temazepam (RESTORIL) 30 MG capsule Take 30 mg by mouth at bedtime as needed. Sleep     [provider]  traZODone (DESYREL) 50 MG tablet Take 1 tablet by mouth at bedtime. 03/15/19   [provider]    Physical Exam: BP (!) 124/59    Pulse (!) 105    Temp 99.5 F (37.5 C) (Axillary)    Resp (!) 21    Ht 5\' 8"  (1.727 m)    Wt 107.8 kg    SpO2 100%    BMI 36.14 kg/m    General: Elderly Caucasian male. Awake and alert and oriented x3. No acute cardiopulmonary distress.  Patient is writhing in bed in pain.  HEENT: Normocephalic atraumatic.  Right and left ears normal in appearance.  Pupils equal, round, reactive to light. Extraocular muscles are intact. Sclerae anicteric and noninjected.  Moist mucosal membranes. No mucosal lesions.   Neck: Neck supple without lymphadenopathy. No carotid bruits. No masses palpated.   Cardiovascular: Regular rate with normal S1-S2 sounds. No murmurs, rubs, gallops auscultated. No JVD.   Respiratory: Good respiratory effort with no wheezes, rales, rhonchi. Lungs clear to auscultation bilaterally.  No accessory muscle use.  Abdomen: Soft, nontender, nondistended. Active bowel sounds. No masses or hepatosplenomegaly   Skin: Left shoulder slightly swollen.  Dry, warm to touch. 2+ dorsalis pedis and radial  pulses.  Musculoskeletal: No calf or leg pain. All major joints not erythematous nontender.  No upper or lower joint deformation.  Good ROM.  No contractures   Psychiatric: Intact judgment and insight. Pleasant and cooperative.  Neurologic: No focal neurological deficits. Strength is 5/5 and symmetric in upper and lower extremities.  Cranial nerves II through XII are grossly intact.           Labs on Admission: I have personally reviewed following labs and imaging studies  CBC: Recent Labs  Lab 04/19/19 1421 04/20/19 0600 04/24/19 0357 04/25/19 0227  WBC 15.0* 8.5 13.2* 9.4  NEUTROABS 12.7*  --   --   --   HGB 14.3 13.1 12.4* 11.6*  HCT 41.7 40.4 36.3* 35.4*  MCV 86.0 90.4  87.5 89.6  PLT 309 250 259 197   Basic Metabolic Panel: Recent Labs  Lab 04/20/19 0600 04/20/19 0815 04/20/19 1322 04/22/19 0610 04/24/19 1533 04/25/19 0227  NA 139  --  136 142 141 144  K 3.4*  --  3.8 3.5 3.4* 3.1*  CL 99  --  102 104 99 106  CO2 27  --  GLUCOSE 113*  --  113* 138* 107* 94  BUN 39*  --  32* CREATININE 1.83*  --  1.53* 1.13 1.06 0.97  CALCIUM 8.3*  --  8.2* 8.2* 8.4* 8.1*  MG  --  2.0  --   --   --   --    GFR: Estimated Creatinine Clearance: 85.6 mL/min (by C-G formula based on SCr of 0.97 mg/dL). Liver Function Tests: Recent Labs  Lab 04/19/19 1421 04/20/19 0600 04/20/19 1322 04/22/19 1157  AST 264* 277* 277* 193*  ALT 107* 112* 111* 112*  ALKPHOS 131* 109 100 104  BILITOT 0.7 0.7 0.7 0.7  PROT 6.7 6.5 5.9* 6.5  ALBUMIN 3.1* 2.9* 2.6* 2.8*   No results for input(s): LIPASE, AMYLASE in the last 168 hours. No results for input(s): AMMONIA in the last 168 hours. Coagulation Profile: No results for input(s): INR, PROTIME in the last 168 hours. Cardiac Enzymes: Recent Labs  Lab 04/19/19 1421 04/19/19 2011 04/20/19 0600 04/21/19 0431 04/22/19 0610  CKTOTAL 9,598* 9,702* 9,086* 5,742* 3,618*   BNP (last 3 results) No results for  input(s): PROBNP in the last 8760 hours. HbA1C: No results for input(s): HGBA1C in the last 72 hours. CBG: No results for input(s): GLUCAP in the last 168 hours. Lipid Profile: No results for input(s): CHOL, HDL, LDLCALC, TRIG, CHOLHDL, LDLDIRECT in the last 72 hours. Thyroid Function Tests: No results for input(s): TSH, T4TOTAL, FREET4, T3FREE, THYROIDAB in the last 72 hours. Anemia Panel: No results for input(s): VITAMINB12, FOLATE, FERRITIN, TIBC, IRON, RETICCTPCT in the last 72 hours. Urine analysis:    Component Value Date/Time   COLORURINE YELLOW 04/24/2019 1459   APPEARANCEUR HAZY (A) 04/24/2019 1459   LABSPEC 1.017 04/24/2019 1459   PHURINE 6.0 04/24/2019 1459   GLUCOSEU NEGATIVE 04/24/2019 1459   HGBUR MODERATE (A) 04/24/2019 1459   BILIRUBINUR NEGATIVE 04/24/2019 1459   KETONESUR NEGATIVE 04/24/2019 1459   PROTEINUR 30 (A) 04/24/2019 1459   UROBILINOGEN 0.2 07/11/2007 0010   NITRITE NEGATIVE 04/24/2019 1459   LEUKOCYTESUR MODERATE (A) 04/24/2019 1459   Sepsis Labs: (procalcitonin:4,lacticidven:4) ) Recent Results (from the past 240 hour(s))  SARS Coronavirus 2 (CEPHEID - Performed in [facility] hospital lab), Hosp Order     Status: None   Collection Time: 04/19/19  4:26 PM  Result Value Ref Range Status   SARS Coronavirus 2 NEGATIVE NEGATIVE Final    Comment: (NOTE) If result is NEGATIVE SARS-CoV-2 target nucleic acids are NOT DETECTED. The SARS-CoV-2 RNA is generally detectable in upper and lower  respiratory specimens during the acute phase of infection. The lowest  concentration of SARS-CoV-2 viral copies this assay can detect is 250  copies / mL. A negative result does not preclude SARS-CoV-2 infection  and should not be used as the sole basis for treatment or other  patient management decisions.  A negative result may occur with  improper specimen collection / handling, submission of specimen other  than nasopharyngeal swab, presence of viral  mutation(s) within the  areas targeted by this assay, and inadequate number of  viral copies  (<250 copies / mL). A negative result must be combined with clinical  observations, patient history, and epidemiological information. If result is POSITIVE SARS-CoV-2 target nucleic acids are DETECTED. The SARS-CoV-2 RNA is generally detectable in upper and lower  respiratory specimens dur ing the acute phase of infection.  Positive  results are indicative of active infection with SARS-CoV-2.  Clinical  correlation with patient history and other diagnostic information is  necessary to determine patient infection status.  Positive results do  not rule out bacterial infection or co-infection with other viruses. If result is PRESUMPTIVE POSTIVE SARS-CoV-2 nucleic acids MAY BE PRESENT.   A presumptive positive result was obtained on the submitted specimen  and confirmed on repeat testing.  While 2019 novel coronavirus  (SARS-CoV-2) nucleic acids may be present in the submitted sample  additional confirmatory testing may be necessary for epidemiological  and / or clinical management purposes  to differentiate between  SARS-CoV-2 and other Sarbecovirus currently known to infect humans.  If clinically indicated additional testing with an alternate test  methodology [phone]) is advised. The SARS-CoV-2 RNA is generally  detectable in upper and lower respiratory sp ecimens during the acute  phase of infection. The expected result is Negative. Fact Sheet for Patients:  BoilerBrush.com.cy Fact Sheet for Healthcare Providers: https://pope.com/ This test is not yet approved or cleared by the [place] FDA and has been authorized for detection and/or diagnosis of SARS-CoV-2 by FDA under an Emergency Use Authorization (EUA).  This EUA will remain in effect (meaning this test can be used) for the duration of the COVID-19 declaration under Section 564(b)(1)  of the Act, 21 U.S.C. section 360bbb-3(b)(1), unless the authorization is terminated or revoked sooner. Performed at [facility], [address], [place], [place] [address]   MRSA PCR Screening     Status: None   Collection Time: 04/23/19 10:32 PM  Result Value Ref Range Status   MRSA by PCR NEGATIVE NEGATIVE Final    Comment:        The GeneXpert MRSA Assay (FDA approved for NASAL specimens only), is one component of a comprehensive MRSA colonization surveillance program. It is not intended to diagnose MRSA infection nor to guide or monitor treatment for MRSA infections. Performed at [facility], [address], [place], [place] [address]   Culture, blood (Routine X 2) w Reflex to ID Panel     Status: None (Preliminary result)   Collection Time: 04/24/19  4:53 PM  Result Value Ref Range Status   Specimen Description BLOOD LEFT HAND  Final   Special Requests   Final    BOTTLES DRAWN AEROBIC AND ANAEROBIC Blood Culture adequate volume   Culture   Final    NO GROWTH < 12 HOURS Performed at [facility], [address] [place], [place] [address]    Report Status PENDING  Incomplete  Culture, blood (Routine X 2) w Reflex to ID Panel     Status: None (Preliminary result)   Collection Time: 04/25/19  2:27 AM  Result Value Ref Range Status   Specimen Description BLOOD LEFT HAND  Final   Special Requests   Final    BOTTLES DRAWN AEROBIC AND ANAEROBIC Blood Culture adequate volume   Culture   Final    NO GROWTH < 12 HOURS Performed at [facility], [address], [place], [place] [address]    Report Status PENDING  Incomplete     Radiological Exams on Admission: Dg Wrist Complete Right  Result  Date: 04/23/2019 CLINICAL DATA:  68 year old male status post fall with pain and swelling. EXAM: RIGHT HAND - 2 VIEW; RIGHT WRIST - COMPLETE 3+ VIEW COMPARISON:  None. FINDINGS: Right hand: Distal right radius and ulna fracture described below. No carpal or metacarpal fracture identified.  No phalanx fracture identified. Generalized soft tissue swelling. Right wrist: Comminuted and dorsally impacted distal right radius fracture with DRU involvement. No definite radiocarpal joint involvement. Mildly comminuted and displaced ulnar styloid fracture. Carpal bone alignment is maintained and no carpal bone fracture is identified. Generalized soft tissue swelling. IMPRESSION: 1. Comminuted and dorsally impacted distal right radius fracture with DRU involvement. 2. Ulnar styloid fracture. 3. No other acute fracture or dislocation identified about the right hand or wrist. Electronically Signed   By: Odessa FlemingH  Hall M.D.   On: 04/23/2019 22:12   Ct Head Wo Contrast  Result Date: 04/23/2019 CLINICAL DATA:  68 year old male new onset vomiting after a fall. EXAM: CT HEAD WITHOUT CONTRAST TECHNIQUE: Contiguous axial images were obtained from the base of the skull through the vertex without intravenous contrast. COMPARISON:  Head CT 1609 hours earlier today.  04/19/2019. FINDINGS: Brain: Less motion artifact. Cerebral volume is within normal limits for age. No midline shift, mass effect, or evidence of intracranial mass lesion. No ventriculomegaly. Incidental dural calcifications. No acute intracranial hemorrhage identified. No cortically based acute infarct identified. Normal for age gray-white matter differentiation. Vascular: No suspicious intracranial vascular hyperdensity. Skull: No acute osseous abnormality identified. Sinuses/Orbits: Visualized paranasal sinuses and mastoids are stable and well pneumatized. Other: No scalp hematoma identified. No acute orbit or scalp soft tissue finding identified. IMPRESSION: Less motion degraded, stable an and normal for age noncontrast CT appearance of the brain. Electronically Signed   By: Odessa FlemingH  Hall M.D.   On: 04/23/2019 21:31   Ct Head Wo Contrast  Result Date: 04/23/2019 CLINICAL DATA:  Found on floor EXAM: CT HEAD WITHOUT CONTRAST TECHNIQUE: Contiguous axial images were  obtained from the base of the skull through the vertex without intravenous contrast. COMPARISON:  04/19/2019 FINDINGS: Examination is significantly limited by patient motion artifact throughout. Brain: No evidence of acute infarction, hemorrhage, hydrocephalus, extra-axial collection or mass lesion/mass effect. Vascular: No hyperdense vessel or unexpected calcification. Skull: Normal. Negative for fracture or focal lesion. Sinuses/Orbits: No acute finding. Other: None. IMPRESSION: Examination is significantly limited by patient motion artifact throughout. Within this limitation, no acute intracranial pathology. Electronically Signed   By: Lauralyn PrimesAlex  Bibbey M.D.   On: 04/23/2019 16:56   Dg Hand 2 View Right  Result Date: 04/23/2019 CLINICAL DATA:  68 year old male status post fall with pain and swelling. EXAM: RIGHT HAND - 2 VIEW; RIGHT WRIST - COMPLETE 3+ VIEW COMPARISON:  None. FINDINGS: Right hand: Distal right radius and ulna fracture described below. No carpal or metacarpal fracture identified. No phalanx fracture identified. Generalized soft tissue swelling. Right wrist: Comminuted and dorsally impacted distal right radius fracture with DRU involvement. No definite radiocarpal joint involvement. Mildly comminuted and displaced ulnar styloid fracture. Carpal bone alignment is maintained and no carpal bone fracture is identified. Generalized soft tissue swelling. IMPRESSION: 1. Comminuted and dorsally impacted distal right radius fracture with DRU involvement. 2. Ulnar styloid fracture. 3. No other acute fracture or dislocation identified about the right hand or wrist. Electronically Signed   By: Odessa FlemingH  Hall M.D.   On: 04/23/2019 22:12    Assessment/Plan: Principal Problem:   Rhabdomyolysis Active Problems:   DISH (diffuse idiopathic skeletal hyperostosis)   GERD (gastroesophageal reflux disease)  Chronic pain   AKI (acute kidney injury) (HCC)   Hypocalcemia   Leukocytosis   Elevated LFTs    This  patient was discussed with the ED physician, including pertinent vitals, physical exam findings, labs, and imaging.  We also discussed care given by the ED provider.  1. Rhabdomyolysis a. Admit b. Given degree of rhabdomyolysis and the patient, the patient qualifies for full admit as patient will likely need more than 8 hours to correct electrolyte imbalance, and clear creatinine to avoid worsening kidney injury c. Check CK and creatinine tonight d. Repeat labs in the morning: CBC, CMP, and CK e. We will give the patient other liter of IV fluids and start maintenance fluids at 200 mL's per hour. 2. AKI a. Elevated creatinine is concerning for heme pigment induced AKI. b. Baseline creatinine is 0.7. c. Continue fluid management and monitor creatinine as above 3. Hypocalcemia a. Given patient is hypocalcemic, patient does not qualify for bicarb infusion to aid in renal protection. b. We will continue to watch calcium levels.  We will not replace at this time due to risk of calcium phosphatemia deposits 4. Leukocytosis a. No evidence of infection b. Repeat WBC in the morning 5. Elevated LFTs a. Transaminitis can occur with patient's who have rhabdo. b. We will hold off on acute hepatic panel and watch for improvement with improvement of the patient's rhabdo. 6. Chronic pain a. Patient quite tolerant b. Although the patient has not had his home pain medicine since Tuesday, he reports no symptoms of withdrawal.   c. We will continue his home pain medicine 7. GERD a. Continue PPI 8. DISH  DVT prophylaxis: Lovenox Consultants: None Code Status: Full code Family Communication: None Disposition Plan: Should be able to return home following improvement of patient's AKI and resolution of rhabdomyolysis   Vickki Hearing, MD

## 2019-04-25 NOTE — Consult Note (Addendum)
Patient ID: [name], male   DOB: 15-Sep-1951, 68 y.o.   MRN: [id number]  Detailed history Detailed exam Low complexity  Requested by Dr Calvert Cantor  Reason for consultation fracture right wrist  Chief Complaint  Patient presents with  . Arm Injury   04/19/2019 HPI [name] is a 68 y.o. male.  The medical record has been reviewed and appropriate portions are incorporated by reference.  He is in the intensive care unit.  He was found after falling and brought into the emergency room and found to have rhabdomyolysis and was placed in the intensive care unit. During his work-up he was found to have a fracture of the right distal radius and a consultation has been obtained to evaluate and manage that.  Location right wrist Duration 6 days or more Severity undetermined due to patient mental status Quality undetermined due to patient mental status Modified by seems to have increased discomfort with movement or pressure on the wrist   Review of Systems (at least 2) Review of Systems  Unable to perform ROS: Other  Skin:       Wrist ok   Neurological: Negative for tingling and sensory change.  The patient is confused which is interfering with evaluation of his review of systems  Past Medical History:  Diagnosis Date  . Cataract   . Chronic pain   . DISH (diffuse idiopathic skeletal hyperostosis)   . GERD (gastroesophageal reflux disease)   . Impaired hearing     Past Surgical History:  Procedure Laterality Date  . BACK SURGERY     X2  . CATARACT EXTRACTION W/PHACO  12/03/2011   Procedure: CATARACT EXTRACTION PHACO AND INTRAOCULAR LENS PLACEMENT (IOC);  Surgeon: Gemma Payor;  Location: [facility];  Service: Ophthalmology;  Laterality: Right;  CDE: 7.86  . CATARACT EXTRACTION W/PHACO  12/21/2011   Procedure: CATARACT EXTRACTION PHACO AND INTRAOCULAR LENS PLACEMENT (IOC);  Surgeon: Gemma Payor, MD;  Location: [facility];  Service: Ophthalmology;  Laterality: Left;  CDE=5.51  .  KNEE ARTHROSCOPY W/ ACL RECONSTRUCTION  05/2000   right, [facility]  . KNEE SURGERY  10/2000   remove screw after ACL  . LAMINECTOMY    . TUMOR EXCISION     back, cyst    Social History  Social History   Tobacco Use  . Smoking status: Never Smoker  . Smokeless tobacco: Current User    Types: Chew  Substance Use Topics  . Alcohol use: No  . Drug use: No    Allergies  Allergen Reactions  . Ceclor [Cefaclor] Other (See Comments)    Keeps patient wake for days.   . Nsaids Other (See Comments)    G.I. Upset  . Other Other (See Comments)    Steroids make patient extremely agitated     Current Facility-Administered Medications  Medication Dose Route Frequency Provider Last Rate Last Dose  . acetaminophen (TYLENOL) tablet 650 mg  650 mg Oral Q6H PRN Calvert Cantor, MD      . ALPRAZolam Prudy Feeler) tablet 0.5 mg  0.5 mg Oral TID Calvert Cantor, MD   0.5 mg at 04/25/19 0919  . Chlorhexidine Gluconate Cloth 2 % PADS 6 each  6 each Topical Q0600 Calvert Cantor, MD   6 each at 04/25/19 0641  . DULoxetine (CYMBALTA) DR capsule 60 mg  60 mg Oral BID Calvert Cantor, MD   60 mg at 04/25/19 0920  . enoxaparin (LOVENOX) injection 50 mg  50 mg Subcutaneous Q24H Levie Heritage, DO  50 mg at 04/24/19 2159  . gabapentin (NEURONTIN) capsule 600 mg  600 mg Oral QHS Levie Heritage, DO   600 mg at 04/24/19 2159  . haloperidol lactate (HALDOL) injection 2 mg  2 mg Intravenous Q6H PRN Calvert Cantor, MD   2 mg at 04/24/19 1522  . HYDROmorphone (DILAUDID) injection 1 mg  1 mg Intravenous Q3H PRN Bobette Mo, MD   1 mg at 04/24/19 0810  . lidocaine (LIDODERM) 5 % 1 patch  1 patch Transdermal Q24H Calvert Cantor, MD   1 patch at 04/25/19 1004  . LORazepam (ATIVAN) injection 0.5 mg  0.5 mg Intravenous Q6H PRN Calvert Cantor, MD   0.5 mg at 04/24/19 1725  . MEDLINE mouth rinse  15 mL Mouth Rinse BID Calvert Cantor, MD   15 mL at 04/25/19 1004  . morphine (MS CONTIN) 12 hr tablet 15 mg  15 mg Oral BID Calvert Cantor, MD   15 mg at 04/25/19 0920  . ondansetron (ZOFRAN) tablet 4 mg  4 mg Oral Q6H PRN Levie Heritage, DO       Or  . ondansetron [facility]) injection 4 mg  4 mg Intravenous Q6H PRN Levie Heritage, DO   4 mg at 04/22/19 5597  . oxyCODONE (Oxy IR/ROXICODONE) immediate release tablet 5 mg  5 mg Oral Q6H PRN Calvert Cantor, MD      . pantoprazole (PROTONIX) EC tablet 40 mg  40 mg Oral Daily Levie Heritage, DO   40 mg at 04/25/19 0920  . polyethylene glycol (MIRALAX / GLYCOLAX) packet 17 g  17 g Oral Daily Calvert Cantor, MD   17 g at 04/25/19 0919  . potassium chloride SA (K-DUR) CR tablet 40 mEq  40 mEq Oral Q4H Calvert Cantor, MD   40 mEq at 04/25/19 0759  . QUEtiapine (SEROQUEL) tablet 12.5 mg  12.5 mg Oral BID Calvert Cantor, MD   12.5 mg at 04/25/19 0919  . temazepam (RESTORIL) capsule 15 mg  15 mg Oral QHS PRN Calvert Cantor, MD   15 mg at 04/21/19 2123      Physical Exam-Detailed Physical Exam  Blood pressure (!) 124/59, pulse (!) 105, temperature 99.5 F (37.5 C), temperature source Axillary, resp. rate (!) 21, height 5\' 8"  (1.727 m), weight 107.8 kg, SpO2 100 %. Gen. appearance no obvious developmental abnormalities  Cardiovascular exam the upper extremity pulses are 2+ with normal temperature  The ambulatory status is stand by assist with platform walker   Extremity examined left upper   Inspection non tender left arm  Range of motion assessment full range of motion is recorded Stability assessment stability test reveal no instability or laxity Muscle strength and muscle tone are normal with no atrophy or tremors Skin there are no scars rashes lesions or lacerations  Sensation to touch is normal The patient is oriented to person place and time The patient's mood and affect show no depression or anxiety or agitation  Right arm T: tenderness right distal radius with swelling of the hand and wrist  R: painful rom and limited rom right wrist  I: xrays used to confirm wrist  joint  is not dislocated  M: motor exam - he is movig all 5 digits but grip strength is limited by pain and swelling Sensory exam is normal Pulses are 2+ and normal Skin shows no open wounds but some bruising and ecchymosis   MEDICAL DECISION MAKING (minimum/low)  Data Reviewed  I have personally reviewed the  imaging studies and the report and my interpretation is:  Distal radius fracture slight apex volar angulation slight dorsal tilt minimal shortening minimal loss of radial inclination angle  Assessment and Plan  Recommend splinting in the short-term secondary to swelling.  A cast can be applied when the swelling has resolved in approximately 2 weeks  Patient may weight-bear with a platform walker and a splint.  (Okay to start platform walker before the splint arrives)  Follow-up x-rays will be needed in 2 weeks  Fuller CanadaStanley Harrison 04/25/2019, 10:34 AM   Vickki HearingStanley E Harrison MD   CLINICAL DATA:  68 year old male status post fall with pain and swelling.  EXAM: RIGHT HAND - 2 VIEW; RIGHT WRIST - COMPLETE 3+ VIEW  COMPARISON:  None.  FINDINGS: Right hand:  Distal right radius and ulna fracture described below. No carpal or metacarpal fracture identified. No phalanx fracture identified.  Generalized soft tissue swelling.  Right wrist: Comminuted and dorsally impacted distal right radius fracture with DRU involvement. No definite radiocarpal joint involvement. Mildly comminuted and displaced ulnar styloid fracture.  Carpal bone alignment is maintained and no carpal bone fracture is identified.  Generalized soft tissue swelling.  IMPRESSION: 1. Comminuted and dorsally impacted distal right radius fracture with DRU involvement. 2. Ulnar styloid fracture. 3. No other acute fracture or dislocation identified about the right hand or wrist.   Electronically Signed   By: Odessa FlemingH  Hall M.D.   On: 04/23/2019 22:12  History and Physical  Cameron PelDanny A Lacuesta  [id number] DOB: 28-Mar-1951 DOA: 04/19/2019  Referring physician: Victoriano LainJulie Idol, PA-C, ED provider PCP: Gareth MorganKnowlton, Steve, MD  Outpatient Specialists:   Patient Coming From: Home  Chief Complaint: Generalized pain  HPI: Cameron PelDanny A [name] is a 68 y.o. male with a history of diffuse idiopathic skeletal hyperostosis (DISH), chronic pain management, GERD, impaired hearing.  Patient seen after a fall Monday night.  Patient does not recall the fall but woke up on the floor in the morning.  He laid on the floor until approximately noon Tuesday, when a neighbor checked on him and helped him to the bed.  Patient laid in bed for the next 24 hours attempting to recover, however continue to mount increasing pain.  Pain mostly in the patient's back, left buttock, left shoulder.  Pain is nonradiating and is severe.  No palliating or provoking factors.  Patient has not been taking his pain medication since the fall.  Emergency Department Course: Pelvis x-ray, shoulder x-ray, CT scan of head and neck all negative.  Creatinine elevated to 0.52 with elevated LFTs: AST 264 and ALT 107.  CK total is 9500.  Patient has a white blood cell count of 15.  Patient bolused with 1 L IV fluids and given IV pain management with morphine 4 mg and Dilaudid 1 mg.  Review of Systems:   Pt denies any fevers, chills, nausea, vomiting, diarrhea, constipation, abdominal pain, shortness of breath, dyspnea on exertion, orthopnea, cough, wheezing, palpitations, headache, vision changes, lightheadedness, dizziness, melena, rectal bleeding.  Review of systems are otherwise negative

## 2019-04-25 NOTE — Progress Notes (Signed)
Physical Therapy Treatment Patient Details Name: [name] A Vercher MRN: [id number] DOB: Jul 27, 1951 Today's Date: 04/25/2019    History of Present Illness [name] is a 68 y.o. male with a history of diffuse idiopathic skeletal hyperostosis (DISH), chronic pain management, GERD, impaired hearing.  Patient seen after a fall Monday night.  Patient does not recall the fall but woke up on the floor in the morning.  He laid on the floor until approximately noon Tuesday, when a neighbor checked on him and helped him to the bed.  Patient laid in bed for the next 24 hours attempting to recover, however continue to mount increasing pain.  Pain mostly in the patient's back, left buttock, left shoulder.  Pain is nonradiating and is severe.  No palliating or provoking factors.  Patient has not been taking his pain medication since the fall.    PT Comments    Patient much more oriented and cooperative today, only requires occasional repeated verbal/tactile cueing to complete functional tasks.  Patient able to use right platform walker appropriately to avoid putting body weight on right wrist, demonstrated increased endurance/distance for gait training without loss of balance, had difficulty maintaining sitting balance when seated at bedside while performing BLE ROM/strengthening exercises due to frequent leaning backwards.  Patient tolerated sitting up in chair after therapy - RN aware.  Patient will benefit from continued physical therapy in hospital and recommended venue below to increase strength, balance, endurance for safe ADLs and gait.    Follow Up Recommendations  SNF;Supervision/Assistance - 24 hour;Supervision for mobility/OOB     Equipment Recommendations  None recommended by PT    Recommendations for Other Services       Precautions / Restrictions Precautions Precautions: Fall Restrictions Weight Bearing Restrictions: No    Mobility  Bed Mobility Overal bed mobility: Needs  Assistance Bed Mobility: Supine to Sit     Supine to sit: Min assist     General bed mobility comments: slow labored movement  Transfers Overall transfer level: Needs assistance Equipment used: Right platform walker Transfers: Sit to/from Stand;Stand Pivot Transfers Sit to Stand: Min assist Stand pivot transfers: Min assist       General transfer comment: slightly unsteady  Ambulation/Gait Ambulation/Gait assistance: Min assist Gait Distance (Feet): 75 Feet Assistive device: Right platform walker Gait Pattern/deviations: Decreased step length - right;Decreased step length - left;Decreased stride length Gait velocity: decreased   General Gait Details: slightly unsteady labored cadence without loss of balance, limited secondary to fatigue   Stairs             Wheelchair Mobility    Modified Rankin (Stroke Patients Only)       Balance Overall balance assessment: Needs assistance Sitting-balance support: Feet supported;No upper extremity supported Sitting balance-Leahy Scale: Fair Sitting balance - Comments: occasional leaning backward especially when completing BLE exercises Postural control: Posterior lean Standing balance support: Bilateral upper extremity supported;During functional activity Standing balance-Leahy Scale: Fair Standing balance comment: using Right platform walker                            Cognition Arousal/Alertness: Awake/alert Behavior During Therapy: WFL for tasks assessed/performed Overall Cognitive Status: Within Functional Limits for tasks assessed                                 General Comments: Requires occasional repeated verbal cues to complete tasks  Exercises General Exercises - Lower Extremity Ankle Circles/Pumps: Seated;AROM;Strengthening;Both;10 reps Long Arc Quad: Seated;AROM;Strengthening;Both;10 reps Hip Flexion/Marching: Seated;AROM;Strengthening;10 reps;Both    General Comments         Pertinent Vitals/Pain Pain Assessment: No/denies pain    Home Living                      Prior Function            PT Goals (current goals can now be found in the care plan section) Acute Rehab PT Goals Patient Stated Goal: return home with friends to assist PT Goal Formulation: With patient Time For Goal Achievement: 05/04/19 Potential to Achieve Goals: Fair Progress towards PT goals: Progressing toward goals    Frequency    Min 3X/week      PT Plan Current plan remains appropriate    Co-evaluation              AM-PAC PT "6 Clicks" Mobility   Outcome Measure  Help needed turning from your back to your side while in a flat bed without using bedrails?: A Little Help needed moving from lying on your back to sitting on the side of a flat bed without using bedrails?: A Little Help needed moving to and from a bed to a chair (including a wheelchair)?: A Little Help needed standing up from a chair using your arms (e.g., wheelchair or bedside chair)?: A Little Help needed to walk in hospital room?: A Little Help needed climbing 3-5 steps with a railing? : A Lot 6 Click Score: 17    End of Session Equipment Utilized During Treatment: Gait belt Activity Tolerance: Patient limited by fatigue;Treatment limited secondary to agitation Patient left: in chair;with call bell/phone within reach;with chair alarm set Nurse Communication: Mobility status PT Visit Diagnosis: Unsteadiness on feet (R26.81);Other abnormalities of gait and mobility (R26.89);Muscle weakness (generalized) (M62.81)     Time: 0973-5329 PT Time Calculation (min) (ACUTE ONLY): 27 min  Charges:  $Gait Training: 8-22 mins $Therapeutic Exercise: 8-22 mins                     12:45 PM, 04/25/19 Ocie Bob, MPT Physical Therapist with [facility] [phone] office [phone] mobile phone

## 2019-04-26 DIAGNOSIS — J80 Acute respiratory distress syndrome: Secondary | ICD-10-CM | POA: Diagnosis not present

## 2019-04-26 DIAGNOSIS — R200 Anesthesia of skin: Secondary | ICD-10-CM | POA: Diagnosis not present

## 2019-04-26 DIAGNOSIS — Z9181 History of falling: Secondary | ICD-10-CM | POA: Diagnosis not present

## 2019-04-26 DIAGNOSIS — T796XXD Traumatic ischemia of muscle, subsequent encounter: Secondary | ICD-10-CM

## 2019-04-26 DIAGNOSIS — R404 Transient alteration of awareness: Secondary | ICD-10-CM | POA: Diagnosis not present

## 2019-04-26 DIAGNOSIS — Z79899 Other long term (current) drug therapy: Secondary | ICD-10-CM | POA: Diagnosis not present

## 2019-04-26 DIAGNOSIS — E876 Hypokalemia: Secondary | ICD-10-CM | POA: Diagnosis not present

## 2019-04-26 DIAGNOSIS — M4810 Ankylosing hyperostosis [Forestier], site unspecified: Secondary | ICD-10-CM | POA: Diagnosis not present

## 2019-04-26 DIAGNOSIS — R0689 Other abnormalities of breathing: Secondary | ICD-10-CM | POA: Diagnosis not present

## 2019-04-26 DIAGNOSIS — R000 Tachycardia, unspecified: Secondary | ICD-10-CM | POA: Diagnosis not present

## 2019-04-26 DIAGNOSIS — G894 Chronic pain syndrome: Secondary | ICD-10-CM | POA: Diagnosis not present

## 2019-04-26 DIAGNOSIS — M6281 Muscle weakness (generalized): Secondary | ICD-10-CM | POA: Diagnosis not present

## 2019-04-26 DIAGNOSIS — R5381 Other malaise: Secondary | ICD-10-CM | POA: Diagnosis not present

## 2019-04-26 DIAGNOSIS — R4182 Altered mental status, unspecified: Secondary | ICD-10-CM | POA: Diagnosis not present

## 2019-04-26 DIAGNOSIS — M6282 Rhabdomyolysis: Secondary | ICD-10-CM | POA: Diagnosis not present

## 2019-04-26 DIAGNOSIS — R279 Unspecified lack of coordination: Secondary | ICD-10-CM | POA: Diagnosis not present

## 2019-04-26 DIAGNOSIS — G9349 Other encephalopathy: Secondary | ICD-10-CM | POA: Diagnosis not present

## 2019-04-26 DIAGNOSIS — N390 Urinary tract infection, site not specified: Secondary | ICD-10-CM | POA: Diagnosis not present

## 2019-04-26 DIAGNOSIS — S52591D Other fractures of lower end of right radius, subsequent encounter for closed fracture with routine healing: Secondary | ICD-10-CM | POA: Diagnosis not present

## 2019-04-26 DIAGNOSIS — H9190 Unspecified hearing loss, unspecified ear: Secondary | ICD-10-CM | POA: Diagnosis not present

## 2019-04-26 DIAGNOSIS — S37099A Other injury of unspecified kidney, initial encounter: Secondary | ICD-10-CM | POA: Diagnosis not present

## 2019-04-26 DIAGNOSIS — R41841 Cognitive communication deficit: Secondary | ICD-10-CM | POA: Diagnosis not present

## 2019-04-26 DIAGNOSIS — R918 Other nonspecific abnormal finding of lung field: Secondary | ICD-10-CM | POA: Diagnosis not present

## 2019-04-26 DIAGNOSIS — G8929 Other chronic pain: Secondary | ICD-10-CM | POA: Diagnosis not present

## 2019-04-26 DIAGNOSIS — R410 Disorientation, unspecified: Secondary | ICD-10-CM | POA: Diagnosis not present

## 2019-04-26 DIAGNOSIS — R0602 Shortness of breath: Secondary | ICD-10-CM | POA: Diagnosis not present

## 2019-04-26 DIAGNOSIS — M150 Primary generalized (osteo)arthritis: Secondary | ICD-10-CM | POA: Diagnosis not present

## 2019-04-26 DIAGNOSIS — Z20828 Contact with and (suspected) exposure to other viral communicable diseases: Secondary | ICD-10-CM | POA: Diagnosis not present

## 2019-04-26 DIAGNOSIS — M179 Osteoarthritis of knee, unspecified: Secondary | ICD-10-CM | POA: Diagnosis not present

## 2019-04-26 DIAGNOSIS — J9601 Acute respiratory failure with hypoxia: Secondary | ICD-10-CM | POA: Diagnosis not present

## 2019-04-26 DIAGNOSIS — T40605A Adverse effect of unspecified narcotics, initial encounter: Secondary | ICD-10-CM | POA: Diagnosis not present

## 2019-04-26 DIAGNOSIS — R2689 Other abnormalities of gait and mobility: Secondary | ICD-10-CM | POA: Diagnosis not present

## 2019-04-26 DIAGNOSIS — N179 Acute kidney failure, unspecified: Secondary | ICD-10-CM | POA: Diagnosis not present

## 2019-04-26 DIAGNOSIS — G990 Autonomic neuropathy in diseases classified elsewhere: Secondary | ICD-10-CM | POA: Diagnosis not present

## 2019-04-26 DIAGNOSIS — E860 Dehydration: Secondary | ICD-10-CM | POA: Diagnosis not present

## 2019-04-26 DIAGNOSIS — R69 Illness, unspecified: Secondary | ICD-10-CM | POA: Diagnosis not present

## 2019-04-26 DIAGNOSIS — G92 Toxic encephalopathy: Secondary | ICD-10-CM | POA: Diagnosis not present

## 2019-04-26 DIAGNOSIS — M545 Low back pain: Secondary | ICD-10-CM | POA: Diagnosis not present

## 2019-04-26 DIAGNOSIS — F5104 Psychophysiologic insomnia: Secondary | ICD-10-CM | POA: Diagnosis not present

## 2019-04-26 DIAGNOSIS — S52614D Nondisplaced fracture of right ulna styloid process, subsequent encounter for closed fracture with routine healing: Secondary | ICD-10-CM | POA: Diagnosis not present

## 2019-04-26 DIAGNOSIS — R945 Abnormal results of liver function studies: Secondary | ICD-10-CM | POA: Diagnosis not present

## 2019-04-26 DIAGNOSIS — R262 Difficulty in walking, not elsewhere classified: Secondary | ICD-10-CM | POA: Diagnosis not present

## 2019-04-26 DIAGNOSIS — Z743 Need for continuous supervision: Secondary | ICD-10-CM | POA: Diagnosis not present

## 2019-04-26 DIAGNOSIS — K219 Gastro-esophageal reflux disease without esophagitis: Secondary | ICD-10-CM | POA: Diagnosis not present

## 2019-04-26 DIAGNOSIS — R42 Dizziness and giddiness: Secondary | ICD-10-CM | POA: Diagnosis not present

## 2019-04-26 DIAGNOSIS — I10 Essential (primary) hypertension: Secondary | ICD-10-CM | POA: Diagnosis not present

## 2019-04-26 LAB — BASIC METABOLIC PANEL
Anion gap: 10 (ref 5–15)
BUN: 21 mg/dL (ref 8–23)
CO2: 27 mmol/L (ref 22–32)
Calcium: 8.3 mg/dL — ABNORMAL LOW (ref 8.9–10.3)
Chloride: 102 mmol/L (ref 98–111)
Creatinine, Ser: 1 mg/dL (ref 0.61–1.24)
GFR calc Af Amer: >60 mL/min (ref >60)
GFR calc non Af Amer: >60 mL/min (ref >60)
Glucose, Bld: 131 mg/dL — ABNORMAL HIGH (ref 70–99)
Potassium: 3.9 mmol/L (ref 3.5–5.1)
Sodium: 139 mmol/L (ref 135–145)

## 2019-04-26 LAB — CBC
HCT: 36.1 % — ABNORMAL LOW (ref 39.0–52.0)
Hemoglobin: 11.8 g/dL — ABNORMAL LOW (ref 13.0–17.0)
MCH: 29.4 pg (ref 26.0–34.0)
MCHC: 32.7 g/dL (ref 30.0–36.0)
MCV: 89.8 fL (ref 80.0–100.0)
Platelets: 190 10*3/uL (ref 150–400)
RBC: 4.02 MIL/uL — ABNORMAL LOW (ref 4.22–5.81)
RDW: 13.1 % (ref 11.5–15.5)
WBC: 9.3 10*3/uL (ref 4.0–10.5)
nRBC: 0 % (ref 0.0–0.2)

## 2019-04-26 LAB — CK: Total CK: 184 U/L (ref 49–397)

## 2019-04-26 LAB — MAGNESIUM: Magnesium: 1.7 mg/dL (ref 1.7–2.4)

## 2019-04-26 MED ORDER — ALPRAZOLAM 0.5 MG PO TABS: 0.5000 mg | ORAL_TABLET | Freq: Two times a day (BID) | ORAL | 0 refills | Status: DC | PRN

## 2019-04-26 MED ORDER — ALPRAZOLAM 0.5 MG PO TABS: 0.5000 mg | ORAL_TABLET | Freq: Three times a day (TID) | ORAL | 0 refills | Status: DC

## 2019-04-26 MED ORDER — MORPHINE SULFATE CR 15 MG PO TB12: 15.0000 mg | ORAL_TABLET | Freq: Two times a day (BID) | ORAL | 0 refills | Status: DC

## 2019-04-26 MED ORDER — ALPRAZOLAM 0.5 MG PO TABS: 0.5000 mg | ORAL_TABLET | Freq: Two times a day (BID) | ORAL | Status: DC | PRN

## 2019-04-26 MED ORDER — OXYCODONE HCL 5 MG PO TABS: 5.0000 mg | ORAL_TABLET | Freq: Three times a day (TID) | ORAL | 0 refills | Status: DC

## 2019-04-26 MED ORDER — TEMAZEPAM 15 MG PO CAPS: 15.0000 mg | ORAL_CAPSULE | Freq: Every evening | ORAL | 0 refills | Status: DC | PRN

## 2019-04-26 NOTE — Care Management Important Message (Signed)
Important Message  Patient Details  Name: [name] MRN: [id number] Date of Birth: Sep 10, 1951   Medicare Important Message Given:  Yes    Corey Harold 04/26/2019, 11:21 AM

## 2019-04-26 NOTE — TOC Transition Note (Addendum)
Transition of Care [facility]) - CM/SW Discharge Note   Patient Details  Name: [name] MRN: [id number] Date of Birth: January 31, 1951  [facility] CM/SW Contact:  Braylon Lemmons, Chauncey Reading, RN Phone Number: 04/26/2019, 11:08 AM   Clinical Narrative:   Patient stable for DC today to SNF. Met with patient at bedside, he is still agreeable to SNF.   Notified Mardene Celeste of [place], they will f/u with [employer] today.  Updated patient's friend , Purcell Nails.   ADDENDUM: [facility] has received notification from [place]. Patient is cleared to DC today. EMS transport will be arranged for 2 pm per [facility] request. DC clinicals faxed. Updated Purcell Nails, patient's friend (POA per patient). Bedside RN to call report.  Verified that SNF will have platform walker at facility for patient.    Final next level of care: Skilled Nursing Facility Barriers to Discharge: No Barriers Identified   Patient Goals and CMS Choice Patient states their goals for this hospitalization and ongoing recovery are:: go to rehab and get home CMS Medicare.gov Compare Post Acute Care list provided to:: Patient Choice offered to / list presented to : Patient  Discharge Placement              Patient chooses bed at: [facility] Patient to be transferred to facility by: EMS Name of family member notified: Malachy Mood Moore-friend Patient and family notified of of transfer: 04/26/19  Discharge Plan and Services In-house Referral: Clinical Social Work Discharge Planning Services: CM Consult Post Acute Care Choice: Home Health          DME Arranged: Hospital bed DME Agency: [employer] Date DME Agency Contacted: 04/21/19 Time DME Agency Contacted: 1520 Representative spoke with at DME Agency: Blake Divine  HH Arranged: RN, PT, Nurse's Aide [place] Agency: [place] ([place]) Date [place]: 04/20/19 Time [place]: [place] Representative spoke with at [place]: [place] ([place]) Interventions     Readmission Risk Interventions Readmission Risk Prevention Plan 04/25/2019  [place] Not Complete  Appt Comments Medical Director at SNF will follow  Transportation Screening Complete  Some recent data might be hidden

## 2019-04-26 NOTE — Discharge Summary (Signed)
Physician Discharge Summary  JP DELANEY ZMO:[id number] DOB: Jul 18, 1951 DOA: 04/19/2019  PCP: Gareth Morgan, MD  Admit date: 04/19/2019 Discharge date: 04/26/2019  Admitted From: Home Disposition:  Home   Recommendations for Outpatient Follow-up:  1. Follow up with PCP in 1-2 weeks 2. Please obtain BMP/CBC in one week 3. Follow up with ortho, Dr. Fuller [place] in 2 weeks     Discharge Condition: Stable CODE STATUS: FULL Diet recommendation: Heart Healthy   Brief/Interim Summary:  68 y.o.malewith a history of diffuse idiopathic skeletal hyperostosis (DISH), chronic pain management, GERD, impaired hearing. He presents after being found on the floor. The patient states that the room he was found in is not a room that he routinely goes into and he does not recall walking into this room. He was laying on his left side when he was found and was noted to have severe left arm swelling and pain in the ED along with difficulty moving the arm. He was also noted to have rhabdomyolysis and AKI.  Per his POA and his caretaker, she thinks he takes more narcotics and benzodiazepine then prescribed and this may have resulted in his fall and the fact that he has no memory of it.    Discharge Diagnoses:   Rhabdomyolysis/ elevated LFTs - likely due to fall and trauma to his left arm and leg hip - CK has improved  [id number]  Acute metabolic encephalopathy beginning on the morning 5/17 -due to UTI  -cont Xanax  -d/c ativan  UTI -continue ceftriaxone -d/c with cefdinir  Fall on 5/17 with right wrist fracture - he began to get confused on 5/17- while being confused, he got up from the chair on 5/17 (without assistance) when no one was in the room and fell  - he developed an abrasion on his forehead- head CT was negative x 2 - he also developed a fracture of the right wrist - Xray>>Comminuted and dorsally impacted distal right radius fracture with DRU involvement and Ulnar styloid  fracture.  -Ortho recommends splinting and then a cast in 2 wks when swelling decreases- also a platform walker  Acute toxic Encephalopathy - cause is undetermined- he does not recall even entering the room where he was found on the floor- he states he had not taken too much pain medications but his POA (who does not live with him) says he takes too much pain medication and is often very sleepy - POA also states this is the second fall in the past few weeks. After the first fall, he is no longer able to get up without having severe back pain - head CT is unrevealing -mental status now back to baseline--pt states he was taking alprazolam 2mg  at hs AND temazepam 60 mg at hs -both of these doses have been decreased during the hospitalizaiton  Trauma to left arm - significant left arm upper swelling due to laying on the arm - continue conservative management- no signs of compartment syndrome- swelling continues to improve and he is gaining more motion   Lower back pain after a fall 3 wks ago - has midline lumbar pain without radiation to his legs - xray of L spine > Stable lucency at T12-L1 - straight leg test negative   AKI (acute kidney injury)   - due to rhabdomyolysis vs dehydration  - is improved with IVF -serum creatinine peaked 2.52 -serum creatinine 1.00 on day of d/c  Hypokalemia - replaced    DISH (diffuse idiopathic skeletal hyperostosis)  Chronic pain - cont  Home doses of narcotics for pain> Neurotntin, MS Contin, Oxycodone, Cymbalta  - have added Lidoderm patch in attempt to limit narcotics  -PMP Aware queried--most recent temazepam 30 mg #180 on 04/14/19; also received alprazolam 1 mg  on 03/15/19; receives MS Contin 15 mg #60 and oxy IR  #90 monthly.  All are from one provider    Discharge Instructions   Allergies as of 04/26/2019      Reactions   Ceclor [cefaclor] Other (See Comments)   Keeps patient wake for days.    Nsaids Other (See Comments)   G.I.  Upset   Other Other (See Comments)   Steroids make patient extremely agitated       Medication List    STOP taking these medications   azithromycin 250 MG tablet Commonly known as:  ZITHROMAX   traZODone 50 MG tablet Commonly known as:  DESYREL     TAKE these medications   ALPRAZolam 0.5 MG tablet Commonly known as:  XANAX Take 1 tablet (0.5 mg total) by mouth 2 (two) times daily as needed for anxiety. What changed:    medication strength  how much to take  when to take this  reasons to take this  additional instructions   DULoxetine 60 MG capsule Commonly known as:  CYMBALTA Take 60 mg by mouth 2 (two) times daily.   fluticasone 0.05 % cream Commonly known as:  CUTIVATE Apply 1 application topically daily as needed (for irritation).   ketoconazole 2 % shampoo Commonly known as:  NIZORAL Apply 1 application topically daily as needed for irritation.   lansoprazole 30 MG capsule Commonly known as:  PREVACID Take 30 mg by mouth daily.   lisinopril 5 MG tablet Commonly known as:  ZESTRIL Take 5 mg by mouth daily.   morphine 15 MG 12 hr tablet Commonly known as:  MS CONTIN Take 1 tablet (15 mg total) by mouth 2 (two) times daily.   oxyCODONE 5 MG immediate release tablet Commonly known as:  Oxy IR/ROXICODONE Take 1 tablet (5 mg total) by mouth 3 (three) times daily.   sildenafil 100 MG tablet Commonly known as:  VIAGRA Take 1 tablet by mouth daily as needed for erectile dysfunction.   temazepam 15 MG capsule Commonly known as:  RESTORIL Take 1 capsule (15 mg total) by mouth at bedtime as needed for sleep. What changed:    medication strength  how much to take  additional instructions            Durable Medical Equipment  (From admission, onward)         Start     Ordered   04/21/19 1508  For home use only DME Hospital bed  Once    Question Answer Comment  Patient has (list medical condition): DISH   The above medical condition  requires: Patient requires the ability to reposition frequently   Head must be elevated greater than: 30 degrees   Bed type Semi-electric   Support Surface: Gel Overlay      04/21/19 1508         Contact information for after-discharge care    Destination    [facility] Preferred SNF .   Service:  Skilled Nursing Contact information: [address] [place] [address] [phone]             Allergies  Allergen Reactions   Ceclor [Cefaclor] Other (See Comments)    Keeps  patient wake for days.    Nsaids Other (See Comments)    G.I. Upset   Other Other (See Comments)    Steroids make patient extremely agitated     Consultations:  none   Procedures/Studies: Dg Chest 1 View  Result Date: 04/19/2019 CLINICAL DATA:  Pain status post fall EXAM: CHEST  1 VIEW COMPARISON:  02/16/2012 FINDINGS: The heart size and mediastinal contours are within normal limits. Both lungs are clear. The visualized skeletal structures are unremarkable. IMPRESSION: No active disease. Electronically Signed   By: Katherine Mantle M.D.   On: 04/19/2019 15:54   Dg Lumbar Spine Complete  Result Date: 04/20/2019 CLINICAL DATA:  Fall several days ago with back pain, initial encounter EXAM: LUMBAR SPINE - COMPLETE 4+ VIEW COMPARISON:  04/14/2019 FINDINGS: Vertebral body height is well maintained. Multilevel osteophytic changes are noted. No anterolisthesis is seen. Disc space narrowing is noted with the exception of L5-S1. Previously seen lucency at T12-L1 is again noted and stable. No new focal abnormality is noted. IMPRESSION: Stable lucency at T12-L1. This again would be better evaluated with CT as previously described. No new focal abnormality is seen. Electronically Signed   By: Alcide Clever M.D.   On: 04/20/2019 11:53   Dg Lumbar Spine Complete  Result Date: 04/14/2019 CLINICAL DATA:  Severe low back pain. Fell 19 days ago. History of  back surgery. EXAM: LUMBAR SPINE - COMPLETE 4+ VIEW COMPARISON:  10/07/2009 FINDINGS: The lowest non rib-bearing vertebra is designated L5. Ribs at T12 are either hypoplastic or absent. There is straightening of the normal lumbar lordosis without significant listhesis. Flowing anterior vertebral ossification is again seen throughout the lumbar and lower thoracic spine. There is new transverse lucency through an anterior vertebral osteophyte at T12-L1 with lucency extending into the L1 superior endplate. Vertebral body heights are preserved in the lumbar spine. There is mild disc space narrowing at L3-4 and L4-5. IMPRESSION: Diffuse idiopathic skeletal hyperostosis with concern for a potentially acute fracture through an anterior osteophyte at T12-L1 with potential involvement of the L1 vertebral body. Lumbar spine CT is recommended for further evaluation. These results will be called to the ordering clinician or representative by the Radiologist Assistant, and communication documented in the PACS or zVision Dashboard. Electronically Signed   By: Sebastian Ache M.D.   On: 04/14/2019 16:25   Dg Wrist Complete Right  Result Date: 04/23/2019 CLINICAL DATA:  68 year old male status post fall with pain and swelling. EXAM: RIGHT HAND - 2 VIEW; RIGHT WRIST - COMPLETE 3+ VIEW COMPARISON:  None. FINDINGS: Right hand: Distal right radius and ulna fracture described below. No carpal or metacarpal fracture identified. No phalanx fracture identified. Generalized soft tissue swelling. Right wrist: Comminuted and dorsally impacted distal right radius fracture with DRU involvement. No definite radiocarpal joint involvement. Mildly comminuted and displaced ulnar styloid fracture. Carpal bone alignment is maintained and no carpal bone fracture is identified. Generalized soft tissue swelling. IMPRESSION: 1. Comminuted and dorsally impacted distal right radius fracture with DRU involvement. 2. Ulnar styloid fracture. 3. No other acute  fracture or dislocation identified about the right hand or wrist. Electronically Signed   By: Odessa Fleming M.D.   On: 04/23/2019 22:12   Ct Head Wo Contrast  Result Date: 04/23/2019 CLINICAL DATA:  68 year old male new onset vomiting after a fall. EXAM: CT HEAD WITHOUT CONTRAST TECHNIQUE: Contiguous axial images were obtained from the base of the skull through the vertex without intravenous contrast. COMPARISON:  Head CT 1609 hours  earlier today.  04/19/2019. FINDINGS: Brain: Less motion artifact. Cerebral volume is within normal limits for age. No midline shift, mass effect, or evidence of intracranial mass lesion. No ventriculomegaly. Incidental dural calcifications. No acute intracranial hemorrhage identified. No cortically based acute infarct identified. Normal for age gray-white matter differentiation. Vascular: No suspicious intracranial vascular hyperdensity. Skull: No acute osseous abnormality identified. Sinuses/Orbits: Visualized paranasal sinuses and mastoids are stable and well pneumatized. Other: No scalp hematoma identified. No acute orbit or scalp soft tissue finding identified. IMPRESSION: Less motion degraded, stable an and normal for age noncontrast CT appearance of the brain. Electronically Signed   By: Odessa Fleming M.D.   On: 04/23/2019 21:31   Ct Head Wo Contrast  Result Date: 04/23/2019 CLINICAL DATA:  Found on floor EXAM: CT HEAD WITHOUT CONTRAST TECHNIQUE: Contiguous axial images were obtained from the base of the skull through the vertex without intravenous contrast. COMPARISON:  04/19/2019 FINDINGS: Examination is significantly limited by patient motion artifact throughout. Brain: No evidence of acute infarction, hemorrhage, hydrocephalus, extra-axial collection or mass lesion/mass effect. Vascular: No hyperdense vessel or unexpected calcification. Skull: Normal. Negative for fracture or focal lesion. Sinuses/Orbits: No acute finding. Other: None. IMPRESSION: Examination is significantly  limited by patient motion artifact throughout. Within this limitation, no acute intracranial pathology. Electronically Signed   By: Lauralyn Primes M.D.   On: 04/23/2019 16:56   Ct Head Wo Contrast  Result Date: 04/19/2019 CLINICAL DATA:  Fall. EXAM: CT HEAD WITHOUT CONTRAST CT CERVICAL SPINE WITHOUT CONTRAST TECHNIQUE: Multidetector CT imaging of the head and cervical spine was performed following the standard protocol without intravenous contrast. Multiplanar CT image reconstructions of the cervical spine were also generated. COMPARISON:  Cervical spine x-rays dated October 07, 2009. FINDINGS: CT HEAD FINDINGS Brain: No evidence of acute infarction, hemorrhage, hydrocephalus, extra-axial collection or mass lesion/mass effect. Vascular: No hyperdense vessel or unexpected calcification. Skull: Normal. Negative for fracture or focal lesion. Sinuses/Orbits: No acute finding. Other: None. CT CERVICAL SPINE FINDINGS Alignment: Straightening of the normal cervical lordosis. No traumatic malalignment. Skull base and vertebrae: No acute fracture. No primary bone lesion or focal pathologic process. Soft tissues and spinal canal: No prevertebral fluid or swelling. No visible canal hematoma. Disc levels: Disc heights are relatively preserved. Evidence of diffuse idiopathic skeletal hyperostosis. Ankylosed bilateral facet joints at C3-C4 and C4-C5. Ankylosed bilateral uncovertebral joints from C3-C4 through C6-C7. Moderate to severe bilateral facet arthropathy at C2-C3. Upper chest: Negative. Other: None. IMPRESSION: 1.  No acute intracranial abnormality. 2.  No acute cervical spine fracture. Electronically Signed   By: Obie Dredge M.D.   On: 04/19/2019 15:28   Ct Cervical Spine Wo Contrast  Result Date: 04/19/2019 CLINICAL DATA:  Fall. EXAM: CT HEAD WITHOUT CONTRAST CT CERVICAL SPINE WITHOUT CONTRAST TECHNIQUE: Multidetector CT imaging of the head and cervical spine was performed following the standard protocol  without intravenous contrast. Multiplanar CT image reconstructions of the cervical spine were also generated. COMPARISON:  Cervical spine x-rays dated October 07, 2009. FINDINGS: CT HEAD FINDINGS Brain: No evidence of acute infarction, hemorrhage, hydrocephalus, extra-axial collection or mass lesion/mass effect. Vascular: No hyperdense vessel or unexpected calcification. Skull: Normal. Negative for fracture or focal lesion. Sinuses/Orbits: No acute finding. Other: None. CT CERVICAL SPINE FINDINGS Alignment: Straightening of the normal cervical lordosis. No traumatic malalignment. Skull base and vertebrae: No acute fracture. No primary bone lesion or focal pathologic process. Soft tissues and spinal canal: No prevertebral fluid or swelling. No visible canal hematoma. Disc levels:  Disc heights are relatively preserved. Evidence of diffuse idiopathic skeletal hyperostosis. Ankylosed bilateral facet joints at C3-C4 and C4-C5. Ankylosed bilateral uncovertebral joints from C3-C4 through C6-C7. Moderate to severe bilateral facet arthropathy at C2-C3. Upper chest: Negative. Other: None. IMPRESSION: 1.  No acute intracranial abnormality. 2.  No acute cervical spine fracture. Electronically Signed   By: Obie DredgeWilliam T Derry M.D.   On: 04/19/2019 15:28   Dg Hand 2 View Right  Result Date: 04/23/2019 CLINICAL DATA:  68 year old male status post fall with pain and swelling. EXAM: RIGHT HAND - 2 VIEW; RIGHT WRIST - COMPLETE 3+ VIEW COMPARISON:  None. FINDINGS: Right hand: Distal right radius and ulna fracture described below. No carpal or metacarpal fracture identified. No phalanx fracture identified. Generalized soft tissue swelling. Right wrist: Comminuted and dorsally impacted distal right radius fracture with DRU involvement. No definite radiocarpal joint involvement. Mildly comminuted and displaced ulnar styloid fracture. Carpal bone alignment is maintained and no carpal bone fracture is identified. Generalized soft tissue  swelling. IMPRESSION: 1. Comminuted and dorsally impacted distal right radius fracture with DRU involvement. 2. Ulnar styloid fracture. 3. No other acute fracture or dislocation identified about the right hand or wrist. Electronically Signed   By: Odessa FlemingH  Hall M.D.   On: 04/23/2019 22:12   Dg Shoulder Left  Result Date: 04/19/2019 CLINICAL DATA:  Pain status post fall EXAM: LEFT SHOULDER - 2+ VIEW COMPARISON:  None. FINDINGS: There is no acute displaced fracture or dislocation. There are degenerative changes of the glenohumeral and acromioclavicular joints. The osseous mineralization is within normal limits. IMPRESSION: No acute osseous abnormality. Electronically Signed   By: Katherine Mantlehristopher  Green M.D.   On: 04/19/2019 15:53   Dg Humerus Left  Result Date: 04/19/2019 CLINICAL DATA:  68 year old male with a history of fall EXAM: LEFT HUMERUS - 2+ VIEW COMPARISON:  None FINDINGS: No acute displaced fracture. Glenohumeral joint appears congruent. No radiopaque foreign body. IMPRESSION: Negative for acute bony abnormality. Electronically Signed   By: Gilmer MorJaime  Wagner D.O.   On: 04/19/2019 15:56   Dg Hips Bilat With Pelvis Min 5 Views  Result Date: 04/19/2019 CLINICAL DATA:  Pain status post fall EXAM: DG HIP (WITH OR WITHOUT PELVIS) 5+V BILAT COMPARISON:  None. FINDINGS: There is no acute displaced fracture or dislocation. Mild-to-moderate degenerative changes are noted of both hips. IMPRESSION: No acute osseous abnormality. Electronically Signed   By: Katherine Mantlehristopher  Green M.D.   On: 04/19/2019 15:56        Discharge Exam: Vitals:   04/26/19 0759 04/26/19 0800  BP:  (!) 142/68  Pulse:  98  Resp:  (!) 23  Temp: 97.7 F (36.5 C)   SpO2:  98%   Vitals:   04/26/19 0600 04/26/19 0700 04/26/19 0759 04/26/19 0800  BP: 113/66 (!) 135/57  (!) 142/68  Pulse: 81 77  98  Resp: 20 (!) 22  (!) 23  Temp:   97.7 F (36.5 C)   TempSrc:      SpO2: 99% 98%  98%  Weight:      Height:        General: Pt is  alert, awake, not in acute distress Cardiovascular: RRR, S1/S2 +, no rubs, no gallops Respiratory: CTA bilaterally, no wheezing, no rhonchi Abdominal: Soft, NT, ND, bowel sounds + Extremities: no edema, no cyanosis   The results of significant diagnostics from this hospitalization (including imaging, microbiology, ancillary and laboratory) are listed below for reference.    Significant Diagnostic Studies: Dg Chest 1 View  Result  Date: 04/19/2019 CLINICAL DATA:  Pain status post fall EXAM: CHEST  1 VIEW COMPARISON:  02/16/2012 FINDINGS: The heart size and mediastinal contours are within normal limits. Both lungs are clear. The visualized skeletal structures are unremarkable. IMPRESSION: No active disease. Electronically Signed   By: Katherine Mantle M.D.   On: 04/19/2019 15:54   Dg Lumbar Spine Complete  Result Date: 04/20/2019 CLINICAL DATA:  Fall several days ago with back pain, initial encounter EXAM: LUMBAR SPINE - COMPLETE 4+ VIEW COMPARISON:  04/14/2019 FINDINGS: Vertebral body height is well maintained. Multilevel osteophytic changes are noted. No anterolisthesis is seen. Disc space narrowing is noted with the exception of L5-S1. Previously seen lucency at T12-L1 is again noted and stable. No new focal abnormality is noted. IMPRESSION: Stable lucency at T12-L1. This again would be better evaluated with CT as previously described. No new focal abnormality is seen. Electronically Signed   By: Alcide Clever M.D.   On: 04/20/2019 11:53   Dg Lumbar Spine Complete  Result Date: 04/14/2019 CLINICAL DATA:  Severe low back pain. Fell 19 days ago. History of back surgery. EXAM: LUMBAR SPINE - COMPLETE 4+ VIEW COMPARISON:  10/07/2009 FINDINGS: The lowest non rib-bearing vertebra is designated L5. Ribs at T12 are either hypoplastic or absent. There is straightening of the normal lumbar lordosis without significant listhesis. Flowing anterior vertebral ossification is again seen throughout the lumbar  and lower thoracic spine. There is new transverse lucency through an anterior vertebral osteophyte at T12-L1 with lucency extending into the L1 superior endplate. Vertebral body heights are preserved in the lumbar spine. There is mild disc space narrowing at L3-4 and L4-5. IMPRESSION: Diffuse idiopathic skeletal hyperostosis with concern for a potentially acute fracture through an anterior osteophyte at T12-L1 with potential involvement of the L1 vertebral body. Lumbar spine CT is recommended for further evaluation. These results will be called to the ordering clinician or representative by the Radiologist Assistant, and communication documented in the PACS or zVision Dashboard. Electronically Signed   By: Sebastian Ache M.D.   On: 04/14/2019 16:25   Dg Wrist Complete Right  Result Date: 04/23/2019 CLINICAL DATA:  68 year old male status post fall with pain and swelling. EXAM: RIGHT HAND - 2 VIEW; RIGHT WRIST - COMPLETE 3+ VIEW COMPARISON:  None. FINDINGS: Right hand: Distal right radius and ulna fracture described below. No carpal or metacarpal fracture identified. No phalanx fracture identified. Generalized soft tissue swelling. Right wrist: Comminuted and dorsally impacted distal right radius fracture with DRU involvement. No definite radiocarpal joint involvement. Mildly comminuted and displaced ulnar styloid fracture. Carpal bone alignment is maintained and no carpal bone fracture is identified. Generalized soft tissue swelling. IMPRESSION: 1. Comminuted and dorsally impacted distal right radius fracture with DRU involvement. 2. Ulnar styloid fracture. 3. No other acute fracture or dislocation identified about the right hand or wrist. Electronically Signed   By: Odessa Fleming M.D.   On: 04/23/2019 22:12   Ct Head Wo Contrast  Result Date: 04/23/2019 CLINICAL DATA:  68 year old male new onset vomiting after a fall. EXAM: CT HEAD WITHOUT CONTRAST TECHNIQUE: Contiguous axial images were obtained from the base of  the skull through the vertex without intravenous contrast. COMPARISON:  Head CT 1609 hours earlier today.  04/19/2019. FINDINGS: Brain: Less motion artifact. Cerebral volume is within normal limits for age. No midline shift, mass effect, or evidence of intracranial mass lesion. No ventriculomegaly. Incidental dural calcifications. No acute intracranial hemorrhage identified. No cortically based acute infarct identified. Normal for  age gray-white matter differentiation. Vascular: No suspicious intracranial vascular hyperdensity. Skull: No acute osseous abnormality identified. Sinuses/Orbits: Visualized paranasal sinuses and mastoids are stable and well pneumatized. Other: No scalp hematoma identified. No acute orbit or scalp soft tissue finding identified. IMPRESSION: Less motion degraded, stable an and normal for age noncontrast CT appearance of the brain. Electronically Signed   By: Odessa Fleming M.D.   On: 04/23/2019 21:31   Ct Head Wo Contrast  Result Date: 04/23/2019 CLINICAL DATA:  Found on floor EXAM: CT HEAD WITHOUT CONTRAST TECHNIQUE: Contiguous axial images were obtained from the base of the skull through the vertex without intravenous contrast. COMPARISON:  04/19/2019 FINDINGS: Examination is significantly limited by patient motion artifact throughout. Brain: No evidence of acute infarction, hemorrhage, hydrocephalus, extra-axial collection or mass lesion/mass effect. Vascular: No hyperdense vessel or unexpected calcification. Skull: Normal. Negative for fracture or focal lesion. Sinuses/Orbits: No acute finding. Other: None. IMPRESSION: Examination is significantly limited by patient motion artifact throughout. Within this limitation, no acute intracranial pathology. Electronically Signed   By: Lauralyn Primes M.D.   On: 04/23/2019 16:56   Ct Head Wo Contrast  Result Date: 04/19/2019 CLINICAL DATA:  Fall. EXAM: CT HEAD WITHOUT CONTRAST CT CERVICAL SPINE WITHOUT CONTRAST TECHNIQUE: Multidetector CT imaging  of the head and cervical spine was performed following the standard protocol without intravenous contrast. Multiplanar CT image reconstructions of the cervical spine were also generated. COMPARISON:  Cervical spine x-rays dated October 07, 2009. FINDINGS: CT HEAD FINDINGS Brain: No evidence of acute infarction, hemorrhage, hydrocephalus, extra-axial collection or mass lesion/mass effect. Vascular: No hyperdense vessel or unexpected calcification. Skull: Normal. Negative for fracture or focal lesion. Sinuses/Orbits: No acute finding. Other: None. CT CERVICAL SPINE FINDINGS Alignment: Straightening of the normal cervical lordosis. No traumatic malalignment. Skull base and vertebrae: No acute fracture. No primary bone lesion or focal pathologic process. Soft tissues and spinal canal: No prevertebral fluid or swelling. No visible canal hematoma. Disc levels: Disc heights are relatively preserved. Evidence of diffuse idiopathic skeletal hyperostosis. Ankylosed bilateral facet joints at C3-C4 and C4-C5. Ankylosed bilateral uncovertebral joints from C3-C4 through C6-C7. Moderate to severe bilateral facet arthropathy at C2-C3. Upper chest: Negative. Other: None. IMPRESSION: 1.  No acute intracranial abnormality. 2.  No acute cervical spine fracture. Electronically Signed   By: Obie Dredge M.D.   On: 04/19/2019 15:28   Ct Cervical Spine Wo Contrast  Result Date: 04/19/2019 CLINICAL DATA:  Fall. EXAM: CT HEAD WITHOUT CONTRAST CT CERVICAL SPINE WITHOUT CONTRAST TECHNIQUE: Multidetector CT imaging of the head and cervical spine was performed following the standard protocol without intravenous contrast. Multiplanar CT image reconstructions of the cervical spine were also generated. COMPARISON:  Cervical spine x-rays dated October 07, 2009. FINDINGS: CT HEAD FINDINGS Brain: No evidence of acute infarction, hemorrhage, hydrocephalus, extra-axial collection or mass lesion/mass effect. Vascular: No hyperdense vessel or  unexpected calcification. Skull: Normal. Negative for fracture or focal lesion. Sinuses/Orbits: No acute finding. Other: None. CT CERVICAL SPINE FINDINGS Alignment: Straightening of the normal cervical lordosis. No traumatic malalignment. Skull base and vertebrae: No acute fracture. No primary bone lesion or focal pathologic process. Soft tissues and spinal canal: No prevertebral fluid or swelling. No visible canal hematoma. Disc levels: Disc heights are relatively preserved. Evidence of diffuse idiopathic skeletal hyperostosis. Ankylosed bilateral facet joints at C3-C4 and C4-C5. Ankylosed bilateral uncovertebral joints from C3-C4 through C6-C7. Moderate to severe bilateral facet arthropathy at C2-C3. Upper chest: Negative. Other: None. IMPRESSION: 1.  No acute intracranial  abnormality. 2.  No acute cervical spine fracture. Electronically Signed   By: Obie Dredge M.D.   On: 04/19/2019 15:28   Dg Hand 2 View Right  Result Date: 04/23/2019 CLINICAL DATA:  68 year old male status post fall with pain and swelling. EXAM: RIGHT HAND - 2 VIEW; RIGHT WRIST - COMPLETE 3+ VIEW COMPARISON:  None. FINDINGS: Right hand: Distal right radius and ulna fracture described below. No carpal or metacarpal fracture identified. No phalanx fracture identified. Generalized soft tissue swelling. Right wrist: Comminuted and dorsally impacted distal right radius fracture with DRU involvement. No definite radiocarpal joint involvement. Mildly comminuted and displaced ulnar styloid fracture. Carpal bone alignment is maintained and no carpal bone fracture is identified. Generalized soft tissue swelling. IMPRESSION: 1. Comminuted and dorsally impacted distal right radius fracture with DRU involvement. 2. Ulnar styloid fracture. 3. No other acute fracture or dislocation identified about the right hand or wrist. Electronically Signed   By: Odessa Fleming M.D.   On: 04/23/2019 22:12   Dg Shoulder Left  Result Date: 04/19/2019 CLINICAL DATA:   Pain status post fall EXAM: LEFT SHOULDER - 2+ VIEW COMPARISON:  None. FINDINGS: There is no acute displaced fracture or dislocation. There are degenerative changes of the glenohumeral and acromioclavicular joints. The osseous mineralization is within normal limits. IMPRESSION: No acute osseous abnormality. Electronically Signed   By: Katherine Mantle M.D.   On: 04/19/2019 15:53   Dg Humerus Left  Result Date: 04/19/2019 CLINICAL DATA:  68 year old male with a history of fall EXAM: LEFT HUMERUS - 2+ VIEW COMPARISON:  None FINDINGS: No acute displaced fracture. Glenohumeral joint appears congruent. No radiopaque foreign body. IMPRESSION: Negative for acute bony abnormality. Electronically Signed   By: Gilmer Mor D.O.   On: 04/19/2019 15:56   Dg Hips Bilat With Pelvis Min 5 Views  Result Date: 04/19/2019 CLINICAL DATA:  Pain status post fall EXAM: DG HIP (WITH OR WITHOUT PELVIS) 5+V BILAT COMPARISON:  None. FINDINGS: There is no acute displaced fracture or dislocation. Mild-to-moderate degenerative changes are noted of both hips. IMPRESSION: No acute osseous abnormality. Electronically Signed   By: Katherine Mantle M.D.   On: 04/19/2019 15:56     Microbiology: Recent Results (from the past 240 hour(s))  SARS Coronavirus 2 (CEPHEID - Performed in [facility] hospital lab), Hosp Order     Status: None   Collection Time: 04/19/19  4:26 PM  Result Value Ref Range Status   SARS Coronavirus 2 NEGATIVE NEGATIVE Final    Comment: (NOTE) If result is NEGATIVE SARS-CoV-2 target nucleic acids are NOT DETECTED. The SARS-CoV-2 RNA is generally detectable in upper and lower  respiratory specimens during the acute phase of infection. The lowest  concentration of SARS-CoV-2 viral copies this assay can detect is 250  copies / mL. A negative result does not preclude SARS-CoV-2 infection  and should not be used as the sole basis for treatment or other  patient management decisions.  A negative result may  occur with  improper specimen collection / handling, submission of specimen other  than nasopharyngeal swab, presence of viral mutation(s) within the  areas targeted by this assay, and inadequate number of viral copies  (<250 copies / mL). A negative result must be combined with clinical  observations, patient history, and epidemiological information. If result is POSITIVE SARS-CoV-2 target nucleic acids are DETECTED. The SARS-CoV-2 RNA is generally detectable in upper and lower  respiratory specimens dur ing the acute phase of infection.  Positive  results are  indicative of active infection with SARS-CoV-2.  Clinical  correlation with patient history and other diagnostic information is  necessary to determine patient infection status.  Positive results do  not rule out bacterial infection or co-infection with other viruses. If result is PRESUMPTIVE POSTIVE SARS-CoV-2 nucleic acids MAY BE PRESENT.   A presumptive positive result was obtained on the submitted specimen  and confirmed on repeat testing.  While 2019 novel coronavirus  (SARS-CoV-2) nucleic acids may be present in the submitted sample  additional confirmatory testing may be necessary for epidemiological  and / or clinical management purposes  to differentiate between  SARS-CoV-2 and other Sarbecovirus currently known to infect humans.  If clinically indicated additional testing with an alternate test  methodology [id number]) is advised. The SARS-CoV-2 RNA is generally  detectable in upper and lower respiratory sp ecimens during the acute  phase of infection. The expected result is Negative. Fact Sheet for Patients:  BoilerBrush.com.cy Fact Sheet for Healthcare Providers: https://pope.com/ This test is not yet approved or cleared by the [place] FDA and has been authorized for detection and/or diagnosis of SARS-CoV-2 by FDA under an Emergency Use Authorization (EUA).  This  EUA will remain in effect (meaning this test can be used) for the duration of the COVID-19 declaration under Section 564(b)(1) of the Act, 21 U.S.C. section 360bbb-3(b)(1), unless the authorization is terminated or revoked sooner. Performed at [facility], [address], [place], [place] [address]   MRSA PCR Screening     Status: None   Collection Time: 04/23/19 10:32 PM  Result Value Ref Range Status   MRSA by PCR NEGATIVE NEGATIVE Final    Comment:        The GeneXpert MRSA Assay (FDA approved for NASAL specimens only), is one component of a comprehensive MRSA colonization surveillance program. It is not intended to diagnose MRSA infection nor to guide or monitor treatment for MRSA infections. Performed at [facility], [address] [place], [place] [address]   Culture, Urine     Status: Abnormal (Preliminary result)   Collection Time: 04/24/19  4:32 PM  Result Value Ref Range Status   Specimen Description   Final    URINE, CLEAN CATCH Performed at [facility], [address], [place], [place] [address]    Special Requests   Final    NONE Performed at [facility], [address], [place], [place] [address]    Culture 50,000 COLONIES/mL GRAM NEGATIVE RODS (A)  Final   Report Status PENDING  Incomplete  Culture, blood (Routine X 2) w Reflex to ID Panel     Status: None (Preliminary result)   Collection Time: 04/24/19  4:53 PM  Result Value Ref Range Status   Specimen Description BLOOD LEFT HAND  Final   Special Requests   Final    BOTTLES DRAWN AEROBIC AND ANAEROBIC Blood Culture adequate volume   Culture   Final    NO GROWTH 2 DAYS Performed at [facility], [address], [place], [place] [address]    Report Status PENDING  Incomplete  Culture, blood (Routine X 2) w Reflex to ID Panel     Status: None (Preliminary result)   Collection Time: 04/25/19  2:27 AM  Result Value Ref Range Status   Specimen Description BLOOD LEFT HAND  Final   Special Requests   Final      BOTTLES DRAWN AEROBIC AND ANAEROBIC Blood Culture adequate volume   Culture   Final    NO GROWTH 1  DAY Performed at [facility], [address] [place], [place] [address]    Report Status PENDING  Incomplete     Labs: Basic Metabolic Panel: Recent Labs  Lab 04/20/19 0815 04/20/19 1322 04/22/19 0610 04/24/19 1533 04/25/19 0227 04/26/19 0926  NA  --  136 142 141 144 139  K  --  3.8 3.5 3.4* 3.1* 3.9  CL  --  102 104 99 106 102  CO2  --  GLUCOSE  --  113* 138* 107* 94 131*  BUN  --  32* CREATININE  --  1.53* 1.13 1.06 0.97 1.00  CALCIUM  --  8.2* 8.2* 8.4* 8.1* 8.3*  MG 2.0  --   --   --   --  1.7   Liver Function Tests: Recent Labs  Lab 04/19/19 1421 04/20/19 0600 04/20/19 1322 04/22/19 1157  AST 264* 277* 277* 193*  ALT 107* 112* 111* 112*  ALKPHOS 131* 109 100 104  BILITOT 0.7 0.7 0.7 0.7  PROT 6.7 6.5 5.9* 6.5  ALBUMIN 3.1* 2.9* 2.6* 2.8*   No results for input(s): LIPASE, AMYLASE in the last 168 hours. No results for input(s): AMMONIA in the last 168 hours. CBC: Recent Labs  Lab 04/19/19 1421 04/20/19 0600 04/24/19 0357 04/25/19 0227 04/26/19 0405  WBC 15.0* 8.5 13.2* 9.4 9.3  NEUTROABS 12.7*  --   --   --   --   HGB 14.3 13.1 12.4* 11.6* 11.8*  HCT 41.7 40.4 36.3* 35.4* 36.1*  MCV 86.0 90.4 87.5 89.6 89.8  PLT 309 250 259 197 190   Cardiac Enzymes: Recent Labs  Lab 04/19/19 2011 04/20/19 0600 04/21/19 0431 04/22/19 0610 04/26/19 0926  CKTOTAL 9,702* 9,086* 5,742* 3,618* 184   BNP: Invalid input(s): POCBNP CBG: No results for input(s): GLUCAP in the last 168 hours.  Time coordinating discharge:  36 minutes  Signed:  Catarina Hartshorn, DO [facility] Pager: [phone] 04/26/2019, 11:17 AM

## 2019-04-26 NOTE — Progress Notes (Signed)
Patient alert and oriented x4. No complaints of pain, shortness of breath, chest pain, dizziness, nausea or vomiting. Patient tolerated meds and diet well. Appetite good. Patient incontinent of urine at times when condom cath not applied. Discharge instructions and prescriptions gone over with patient and put in discharge envelope for [facility] facility. Nurse to nurse report called and given to [place] at [facility]. IV removed without complications. Right wrist/forearm splint continues to be applied. Patient discharged to [facility] with ALL belongings which included patient glasses, cell phone, ring (which was returned via security) and watch (which is applied to his splint). All other belongings are in belonging bags and have been transferred with patient via EMS.

## 2019-04-27 DIAGNOSIS — M6282 Rhabdomyolysis: Secondary | ICD-10-CM | POA: Diagnosis not present

## 2019-04-27 DIAGNOSIS — S37099A Other injury of unspecified kidney, initial encounter: Secondary | ICD-10-CM | POA: Diagnosis not present

## 2019-04-27 DIAGNOSIS — G9349 Other encephalopathy: Secondary | ICD-10-CM | POA: Diagnosis not present

## 2019-04-27 DIAGNOSIS — N390 Urinary tract infection, site not specified: Secondary | ICD-10-CM | POA: Diagnosis not present

## 2019-04-27 LAB — URINE CULTURE: Culture: 50000 — AB

## 2019-04-29 LAB — CULTURE, BLOOD (ROUTINE X 2)
Culture: NO GROWTH
Special Requests: ADEQUATE

## 2019-04-30 LAB — CULTURE, BLOOD (ROUTINE X 2)
Culture: NO GROWTH
Special Requests: ADEQUATE

## 2019-05-05 DIAGNOSIS — M4810 Ankylosing hyperostosis [Forestier], site unspecified: Secondary | ICD-10-CM | POA: Diagnosis not present

## 2019-05-05 DIAGNOSIS — R69 Illness, unspecified: Secondary | ICD-10-CM | POA: Diagnosis not present

## 2019-05-05 DIAGNOSIS — M545 Low back pain: Secondary | ICD-10-CM | POA: Diagnosis not present

## 2019-05-05 DIAGNOSIS — Z79899 Other long term (current) drug therapy: Secondary | ICD-10-CM | POA: Diagnosis not present

## 2019-05-05 DIAGNOSIS — G8929 Other chronic pain: Secondary | ICD-10-CM | POA: Diagnosis not present

## 2019-05-05 DIAGNOSIS — F5104 Psychophysiologic insomnia: Secondary | ICD-10-CM | POA: Diagnosis not present

## 2019-05-05 DIAGNOSIS — E876 Hypokalemia: Secondary | ICD-10-CM | POA: Diagnosis not present

## 2019-05-05 DIAGNOSIS — R0602 Shortness of breath: Secondary | ICD-10-CM | POA: Diagnosis not present

## 2019-05-05 DIAGNOSIS — J9601 Acute respiratory failure with hypoxia: Secondary | ICD-10-CM | POA: Diagnosis not present

## 2019-05-05 DIAGNOSIS — R410 Disorientation, unspecified: Secondary | ICD-10-CM | POA: Diagnosis not present

## 2019-05-05 DIAGNOSIS — E860 Dehydration: Secondary | ICD-10-CM | POA: Diagnosis not present

## 2019-05-05 DIAGNOSIS — H9190 Unspecified hearing loss, unspecified ear: Secondary | ICD-10-CM | POA: Diagnosis not present

## 2019-05-05 DIAGNOSIS — K219 Gastro-esophageal reflux disease without esophagitis: Secondary | ICD-10-CM | POA: Diagnosis not present

## 2019-05-05 DIAGNOSIS — R42 Dizziness and giddiness: Secondary | ICD-10-CM | POA: Diagnosis not present

## 2019-05-06 DIAGNOSIS — K219 Gastro-esophageal reflux disease without esophagitis: Secondary | ICD-10-CM | POA: Diagnosis not present

## 2019-05-06 DIAGNOSIS — R5381 Other malaise: Secondary | ICD-10-CM | POA: Diagnosis not present

## 2019-05-06 DIAGNOSIS — M545 Low back pain: Secondary | ICD-10-CM | POA: Diagnosis not present

## 2019-05-06 DIAGNOSIS — G8929 Other chronic pain: Secondary | ICD-10-CM | POA: Diagnosis not present

## 2019-05-06 DIAGNOSIS — M4810 Ankylosing hyperostosis [Forestier], site unspecified: Secondary | ICD-10-CM | POA: Diagnosis not present

## 2019-05-06 DIAGNOSIS — R279 Unspecified lack of coordination: Secondary | ICD-10-CM | POA: Diagnosis not present

## 2019-05-06 DIAGNOSIS — Z743 Need for continuous supervision: Secondary | ICD-10-CM | POA: Diagnosis not present

## 2019-05-06 DIAGNOSIS — J9601 Acute respiratory failure with hypoxia: Secondary | ICD-10-CM | POA: Diagnosis not present

## 2019-05-07 DIAGNOSIS — R0689 Other abnormalities of breathing: Secondary | ICD-10-CM | POA: Diagnosis not present

## 2019-05-07 DIAGNOSIS — I10 Essential (primary) hypertension: Secondary | ICD-10-CM | POA: Diagnosis not present

## 2019-05-07 DIAGNOSIS — F5104 Psychophysiologic insomnia: Secondary | ICD-10-CM | POA: Diagnosis not present

## 2019-05-07 DIAGNOSIS — K219 Gastro-esophageal reflux disease without esophagitis: Secondary | ICD-10-CM | POA: Diagnosis not present

## 2019-05-07 DIAGNOSIS — R4182 Altered mental status, unspecified: Secondary | ICD-10-CM | POA: Diagnosis not present

## 2019-05-07 DIAGNOSIS — Z79899 Other long term (current) drug therapy: Secondary | ICD-10-CM | POA: Diagnosis not present

## 2019-05-07 DIAGNOSIS — R69 Illness, unspecified: Secondary | ICD-10-CM | POA: Diagnosis not present

## 2019-05-07 DIAGNOSIS — R404 Transient alteration of awareness: Secondary | ICD-10-CM | POA: Diagnosis not present

## 2019-05-07 DIAGNOSIS — N390 Urinary tract infection, site not specified: Secondary | ICD-10-CM | POA: Diagnosis not present

## 2019-05-07 DIAGNOSIS — T40605A Adverse effect of unspecified narcotics, initial encounter: Secondary | ICD-10-CM | POA: Diagnosis not present

## 2019-05-07 DIAGNOSIS — M4810 Ankylosing hyperostosis [Forestier], site unspecified: Secondary | ICD-10-CM | POA: Diagnosis not present

## 2019-05-07 DIAGNOSIS — R5381 Other malaise: Secondary | ICD-10-CM | POA: Diagnosis not present

## 2019-05-07 DIAGNOSIS — G8929 Other chronic pain: Secondary | ICD-10-CM | POA: Diagnosis not present

## 2019-05-07 DIAGNOSIS — R262 Difficulty in walking, not elsewhere classified: Secondary | ICD-10-CM | POA: Diagnosis not present

## 2019-05-07 DIAGNOSIS — Z20828 Contact with and (suspected) exposure to other viral communicable diseases: Secondary | ICD-10-CM | POA: Diagnosis not present

## 2019-05-07 DIAGNOSIS — R918 Other nonspecific abnormal finding of lung field: Secondary | ICD-10-CM | POA: Diagnosis not present

## 2019-05-08 DIAGNOSIS — R69 Illness, unspecified: Secondary | ICD-10-CM | POA: Diagnosis not present

## 2019-05-08 DIAGNOSIS — M4810 Ankylosing hyperostosis [Forestier], site unspecified: Secondary | ICD-10-CM | POA: Diagnosis not present

## 2019-05-08 DIAGNOSIS — R918 Other nonspecific abnormal finding of lung field: Secondary | ICD-10-CM | POA: Diagnosis not present

## 2019-05-08 DIAGNOSIS — T40605A Adverse effect of unspecified narcotics, initial encounter: Secondary | ICD-10-CM | POA: Diagnosis not present

## 2019-05-08 DIAGNOSIS — K219 Gastro-esophageal reflux disease without esophagitis: Secondary | ICD-10-CM | POA: Diagnosis not present

## 2019-05-08 DIAGNOSIS — R4182 Altered mental status, unspecified: Secondary | ICD-10-CM | POA: Diagnosis not present

## 2019-05-08 DIAGNOSIS — R200 Anesthesia of skin: Secondary | ICD-10-CM | POA: Diagnosis not present

## 2019-05-09 DIAGNOSIS — M4810 Ankylosing hyperostosis [Forestier], site unspecified: Secondary | ICD-10-CM | POA: Diagnosis not present

## 2019-05-09 DIAGNOSIS — R69 Illness, unspecified: Secondary | ICD-10-CM | POA: Diagnosis not present

## 2019-05-09 DIAGNOSIS — K219 Gastro-esophageal reflux disease without esophagitis: Secondary | ICD-10-CM | POA: Diagnosis not present

## 2019-05-09 DIAGNOSIS — T40605A Adverse effect of unspecified narcotics, initial encounter: Secondary | ICD-10-CM | POA: Diagnosis not present

## 2019-05-09 DIAGNOSIS — R4182 Altered mental status, unspecified: Secondary | ICD-10-CM | POA: Diagnosis not present

## 2019-05-10 DIAGNOSIS — M4810 Ankylosing hyperostosis [Forestier], site unspecified: Secondary | ICD-10-CM | POA: Diagnosis not present

## 2019-05-10 DIAGNOSIS — R4182 Altered mental status, unspecified: Secondary | ICD-10-CM | POA: Diagnosis not present

## 2019-05-10 DIAGNOSIS — R69 Illness, unspecified: Secondary | ICD-10-CM | POA: Diagnosis not present

## 2019-05-10 DIAGNOSIS — T40605A Adverse effect of unspecified narcotics, initial encounter: Secondary | ICD-10-CM | POA: Diagnosis not present

## 2019-05-10 DIAGNOSIS — K219 Gastro-esophageal reflux disease without esophagitis: Secondary | ICD-10-CM | POA: Diagnosis not present

## 2019-05-10 DIAGNOSIS — M150 Primary generalized (osteo)arthritis: Secondary | ICD-10-CM | POA: Diagnosis not present

## 2019-05-10 DIAGNOSIS — M179 Osteoarthritis of knee, unspecified: Secondary | ICD-10-CM | POA: Diagnosis not present

## 2019-05-10 DIAGNOSIS — G990 Autonomic neuropathy in diseases classified elsewhere: Secondary | ICD-10-CM | POA: Diagnosis not present

## 2019-05-11 DIAGNOSIS — K219 Gastro-esophageal reflux disease without esophagitis: Secondary | ICD-10-CM | POA: Diagnosis not present

## 2019-05-11 DIAGNOSIS — M4810 Ankylosing hyperostosis [Forestier], site unspecified: Secondary | ICD-10-CM | POA: Diagnosis not present

## 2019-05-11 DIAGNOSIS — R69 Illness, unspecified: Secondary | ICD-10-CM | POA: Diagnosis not present

## 2019-05-11 DIAGNOSIS — T40605A Adverse effect of unspecified narcotics, initial encounter: Secondary | ICD-10-CM | POA: Diagnosis not present

## 2019-05-11 DIAGNOSIS — R4182 Altered mental status, unspecified: Secondary | ICD-10-CM | POA: Diagnosis not present

## 2019-05-12 DIAGNOSIS — K219 Gastro-esophageal reflux disease without esophagitis: Secondary | ICD-10-CM | POA: Diagnosis not present

## 2019-05-12 DIAGNOSIS — M4810 Ankylosing hyperostosis [Forestier], site unspecified: Secondary | ICD-10-CM | POA: Diagnosis not present

## 2019-05-12 DIAGNOSIS — R4182 Altered mental status, unspecified: Secondary | ICD-10-CM | POA: Diagnosis not present

## 2019-05-12 DIAGNOSIS — R69 Illness, unspecified: Secondary | ICD-10-CM | POA: Diagnosis not present

## 2019-05-12 DIAGNOSIS — T40605A Adverse effect of unspecified narcotics, initial encounter: Secondary | ICD-10-CM | POA: Diagnosis not present

## 2019-05-13 DIAGNOSIS — K219 Gastro-esophageal reflux disease without esophagitis: Secondary | ICD-10-CM | POA: Diagnosis not present

## 2019-05-13 DIAGNOSIS — R69 Illness, unspecified: Secondary | ICD-10-CM | POA: Diagnosis not present

## 2019-05-13 DIAGNOSIS — T40605A Adverse effect of unspecified narcotics, initial encounter: Secondary | ICD-10-CM | POA: Diagnosis not present

## 2019-05-13 DIAGNOSIS — M4810 Ankylosing hyperostosis [Forestier], site unspecified: Secondary | ICD-10-CM | POA: Diagnosis not present

## 2019-05-13 DIAGNOSIS — R4182 Altered mental status, unspecified: Secondary | ICD-10-CM | POA: Diagnosis not present

## 2019-05-14 DIAGNOSIS — M4810 Ankylosing hyperostosis [Forestier], site unspecified: Secondary | ICD-10-CM | POA: Diagnosis not present

## 2019-05-14 DIAGNOSIS — R69 Illness, unspecified: Secondary | ICD-10-CM | POA: Diagnosis not present

## 2019-05-14 DIAGNOSIS — R4182 Altered mental status, unspecified: Secondary | ICD-10-CM | POA: Diagnosis not present

## 2019-05-14 DIAGNOSIS — T40605A Adverse effect of unspecified narcotics, initial encounter: Secondary | ICD-10-CM | POA: Diagnosis not present

## 2019-05-14 DIAGNOSIS — K219 Gastro-esophageal reflux disease without esophagitis: Secondary | ICD-10-CM | POA: Diagnosis not present

## 2019-05-15 DIAGNOSIS — T40605A Adverse effect of unspecified narcotics, initial encounter: Secondary | ICD-10-CM | POA: Diagnosis not present

## 2019-05-15 DIAGNOSIS — R4182 Altered mental status, unspecified: Secondary | ICD-10-CM | POA: Diagnosis not present

## 2019-05-15 DIAGNOSIS — M4810 Ankylosing hyperostosis [Forestier], site unspecified: Secondary | ICD-10-CM | POA: Diagnosis not present

## 2019-05-15 DIAGNOSIS — R69 Illness, unspecified: Secondary | ICD-10-CM | POA: Diagnosis not present

## 2019-05-15 DIAGNOSIS — K219 Gastro-esophageal reflux disease without esophagitis: Secondary | ICD-10-CM | POA: Diagnosis not present

## 2019-05-16 DIAGNOSIS — R4182 Altered mental status, unspecified: Secondary | ICD-10-CM | POA: Diagnosis not present

## 2019-05-16 DIAGNOSIS — M4810 Ankylosing hyperostosis [Forestier], site unspecified: Secondary | ICD-10-CM | POA: Diagnosis not present

## 2019-05-16 DIAGNOSIS — K219 Gastro-esophageal reflux disease without esophagitis: Secondary | ICD-10-CM | POA: Diagnosis not present

## 2019-05-16 DIAGNOSIS — R69 Illness, unspecified: Secondary | ICD-10-CM | POA: Diagnosis not present

## 2019-05-16 DIAGNOSIS — T40605A Adverse effect of unspecified narcotics, initial encounter: Secondary | ICD-10-CM | POA: Diagnosis not present

## 2019-05-17 DIAGNOSIS — R4182 Altered mental status, unspecified: Secondary | ICD-10-CM | POA: Diagnosis not present

## 2019-05-17 DIAGNOSIS — K219 Gastro-esophageal reflux disease without esophagitis: Secondary | ICD-10-CM | POA: Diagnosis not present

## 2019-05-17 DIAGNOSIS — M4810 Ankylosing hyperostosis [Forestier], site unspecified: Secondary | ICD-10-CM | POA: Diagnosis not present

## 2019-05-17 DIAGNOSIS — T40605A Adverse effect of unspecified narcotics, initial encounter: Secondary | ICD-10-CM | POA: Diagnosis not present

## 2019-05-17 DIAGNOSIS — R69 Illness, unspecified: Secondary | ICD-10-CM | POA: Diagnosis not present

## 2019-05-18 DIAGNOSIS — R69 Illness, unspecified: Secondary | ICD-10-CM | POA: Diagnosis not present

## 2019-05-18 DIAGNOSIS — K219 Gastro-esophageal reflux disease without esophagitis: Secondary | ICD-10-CM | POA: Diagnosis not present

## 2019-05-18 DIAGNOSIS — T40605A Adverse effect of unspecified narcotics, initial encounter: Secondary | ICD-10-CM | POA: Diagnosis not present

## 2019-05-18 DIAGNOSIS — M4810 Ankylosing hyperostosis [Forestier], site unspecified: Secondary | ICD-10-CM | POA: Diagnosis not present

## 2019-05-18 DIAGNOSIS — R4182 Altered mental status, unspecified: Secondary | ICD-10-CM | POA: Diagnosis not present

## 2019-05-19 DIAGNOSIS — Z7401 Bed confinement status: Secondary | ICD-10-CM | POA: Diagnosis not present

## 2019-05-19 DIAGNOSIS — R5381 Other malaise: Secondary | ICD-10-CM | POA: Diagnosis not present

## 2019-05-19 DIAGNOSIS — R4182 Altered mental status, unspecified: Secondary | ICD-10-CM | POA: Diagnosis not present

## 2019-05-19 DIAGNOSIS — R279 Unspecified lack of coordination: Secondary | ICD-10-CM | POA: Diagnosis not present

## 2019-05-19 DIAGNOSIS — R69 Illness, unspecified: Secondary | ICD-10-CM | POA: Diagnosis not present

## 2019-05-19 DIAGNOSIS — K219 Gastro-esophageal reflux disease without esophagitis: Secondary | ICD-10-CM | POA: Diagnosis not present

## 2019-05-19 DIAGNOSIS — M4810 Ankylosing hyperostosis [Forestier], site unspecified: Secondary | ICD-10-CM | POA: Diagnosis not present

## 2019-05-19 DIAGNOSIS — T40605A Adverse effect of unspecified narcotics, initial encounter: Secondary | ICD-10-CM | POA: Diagnosis not present

## 2019-05-24 DIAGNOSIS — I10 Essential (primary) hypertension: Secondary | ICD-10-CM | POA: Diagnosis not present

## 2019-05-24 DIAGNOSIS — Z299 Encounter for prophylactic measures, unspecified: Secondary | ICD-10-CM | POA: Diagnosis not present

## 2019-05-24 DIAGNOSIS — R69 Illness, unspecified: Secondary | ICD-10-CM | POA: Diagnosis not present

## 2019-05-24 DIAGNOSIS — G8929 Other chronic pain: Secondary | ICD-10-CM | POA: Diagnosis not present

## 2019-05-24 DIAGNOSIS — G4700 Insomnia, unspecified: Secondary | ICD-10-CM | POA: Diagnosis not present

## 2019-05-25 DIAGNOSIS — S52611D Displaced fracture of right ulna styloid process, subsequent encounter for closed fracture with routine healing: Secondary | ICD-10-CM | POA: Diagnosis not present

## 2019-05-25 DIAGNOSIS — S52501D Unspecified fracture of the lower end of right radius, subsequent encounter for closed fracture with routine healing: Secondary | ICD-10-CM | POA: Diagnosis not present

## 2019-05-25 DIAGNOSIS — M4810 Ankylosing hyperostosis [Forestier], site unspecified: Secondary | ICD-10-CM | POA: Diagnosis not present

## 2019-05-25 DIAGNOSIS — K219 Gastro-esophageal reflux disease without esophagitis: Secondary | ICD-10-CM | POA: Diagnosis not present

## 2019-05-25 DIAGNOSIS — M6282 Rhabdomyolysis: Secondary | ICD-10-CM | POA: Diagnosis not present

## 2019-05-25 DIAGNOSIS — Z9181 History of falling: Secondary | ICD-10-CM | POA: Diagnosis not present

## 2019-05-25 DIAGNOSIS — H9190 Unspecified hearing loss, unspecified ear: Secondary | ICD-10-CM | POA: Diagnosis not present

## 2019-05-25 DIAGNOSIS — G8929 Other chronic pain: Secondary | ICD-10-CM | POA: Diagnosis not present

## 2019-05-26 DIAGNOSIS — R350 Frequency of micturition: Secondary | ICD-10-CM | POA: Diagnosis not present

## 2019-05-29 DIAGNOSIS — S52611D Displaced fracture of right ulna styloid process, subsequent encounter for closed fracture with routine healing: Secondary | ICD-10-CM | POA: Diagnosis not present

## 2019-05-29 DIAGNOSIS — Z9181 History of falling: Secondary | ICD-10-CM | POA: Diagnosis not present

## 2019-05-29 DIAGNOSIS — S52501D Unspecified fracture of the lower end of right radius, subsequent encounter for closed fracture with routine healing: Secondary | ICD-10-CM | POA: Diagnosis not present

## 2019-05-29 DIAGNOSIS — G8929 Other chronic pain: Secondary | ICD-10-CM | POA: Diagnosis not present

## 2019-05-29 DIAGNOSIS — K219 Gastro-esophageal reflux disease without esophagitis: Secondary | ICD-10-CM | POA: Diagnosis not present

## 2019-05-29 DIAGNOSIS — H9190 Unspecified hearing loss, unspecified ear: Secondary | ICD-10-CM | POA: Diagnosis not present

## 2019-05-29 DIAGNOSIS — M6282 Rhabdomyolysis: Secondary | ICD-10-CM | POA: Diagnosis not present

## 2019-05-29 DIAGNOSIS — M4810 Ankylosing hyperostosis [Forestier], site unspecified: Secondary | ICD-10-CM | POA: Diagnosis not present

## 2019-05-31 DIAGNOSIS — M4810 Ankylosing hyperostosis [Forestier], site unspecified: Secondary | ICD-10-CM | POA: Diagnosis not present

## 2019-05-31 DIAGNOSIS — Z299 Encounter for prophylactic measures, unspecified: Secondary | ICD-10-CM | POA: Diagnosis not present

## 2019-05-31 DIAGNOSIS — I10 Essential (primary) hypertension: Secondary | ICD-10-CM | POA: Diagnosis not present

## 2019-05-31 DIAGNOSIS — K219 Gastro-esophageal reflux disease without esophagitis: Secondary | ICD-10-CM | POA: Diagnosis not present

## 2019-05-31 DIAGNOSIS — Z9181 History of falling: Secondary | ICD-10-CM | POA: Diagnosis not present

## 2019-05-31 DIAGNOSIS — M6282 Rhabdomyolysis: Secondary | ICD-10-CM | POA: Diagnosis not present

## 2019-05-31 DIAGNOSIS — R69 Illness, unspecified: Secondary | ICD-10-CM | POA: Diagnosis not present

## 2019-05-31 DIAGNOSIS — H9190 Unspecified hearing loss, unspecified ear: Secondary | ICD-10-CM | POA: Diagnosis not present

## 2019-05-31 DIAGNOSIS — G8929 Other chronic pain: Secondary | ICD-10-CM | POA: Diagnosis not present

## 2019-05-31 DIAGNOSIS — S52501D Unspecified fracture of the lower end of right radius, subsequent encounter for closed fracture with routine healing: Secondary | ICD-10-CM | POA: Diagnosis not present

## 2019-05-31 DIAGNOSIS — S52611D Displaced fracture of right ulna styloid process, subsequent encounter for closed fracture with routine healing: Secondary | ICD-10-CM | POA: Diagnosis not present

## 2019-05-31 DIAGNOSIS — J309 Allergic rhinitis, unspecified: Secondary | ICD-10-CM | POA: Diagnosis not present

## 2019-06-01 DIAGNOSIS — Z9181 History of falling: Secondary | ICD-10-CM | POA: Diagnosis not present

## 2019-06-01 DIAGNOSIS — M6282 Rhabdomyolysis: Secondary | ICD-10-CM | POA: Diagnosis not present

## 2019-06-01 DIAGNOSIS — M4810 Ankylosing hyperostosis [Forestier], site unspecified: Secondary | ICD-10-CM | POA: Diagnosis not present

## 2019-06-01 DIAGNOSIS — K219 Gastro-esophageal reflux disease without esophagitis: Secondary | ICD-10-CM | POA: Diagnosis not present

## 2019-06-01 DIAGNOSIS — G8929 Other chronic pain: Secondary | ICD-10-CM | POA: Diagnosis not present

## 2019-06-01 DIAGNOSIS — S52501D Unspecified fracture of the lower end of right radius, subsequent encounter for closed fracture with routine healing: Secondary | ICD-10-CM | POA: Diagnosis not present

## 2019-06-01 DIAGNOSIS — S52611D Displaced fracture of right ulna styloid process, subsequent encounter for closed fracture with routine healing: Secondary | ICD-10-CM | POA: Diagnosis not present

## 2019-06-01 DIAGNOSIS — H9190 Unspecified hearing loss, unspecified ear: Secondary | ICD-10-CM | POA: Diagnosis not present

## 2019-06-05 DIAGNOSIS — S52501D Unspecified fracture of the lower end of right radius, subsequent encounter for closed fracture with routine healing: Secondary | ICD-10-CM | POA: Diagnosis not present

## 2019-06-05 DIAGNOSIS — Z9181 History of falling: Secondary | ICD-10-CM | POA: Diagnosis not present

## 2019-06-05 DIAGNOSIS — K219 Gastro-esophageal reflux disease without esophagitis: Secondary | ICD-10-CM | POA: Diagnosis not present

## 2019-06-05 DIAGNOSIS — G8929 Other chronic pain: Secondary | ICD-10-CM | POA: Diagnosis not present

## 2019-06-05 DIAGNOSIS — H9190 Unspecified hearing loss, unspecified ear: Secondary | ICD-10-CM | POA: Diagnosis not present

## 2019-06-05 DIAGNOSIS — M6282 Rhabdomyolysis: Secondary | ICD-10-CM | POA: Diagnosis not present

## 2019-06-05 DIAGNOSIS — S52611D Displaced fracture of right ulna styloid process, subsequent encounter for closed fracture with routine healing: Secondary | ICD-10-CM | POA: Diagnosis not present

## 2019-06-05 DIAGNOSIS — M4810 Ankylosing hyperostosis [Forestier], site unspecified: Secondary | ICD-10-CM | POA: Diagnosis not present

## 2019-06-07 DIAGNOSIS — K219 Gastro-esophageal reflux disease without esophagitis: Secondary | ICD-10-CM | POA: Diagnosis not present

## 2019-06-07 DIAGNOSIS — M6282 Rhabdomyolysis: Secondary | ICD-10-CM | POA: Diagnosis not present

## 2019-06-07 DIAGNOSIS — M4810 Ankylosing hyperostosis [Forestier], site unspecified: Secondary | ICD-10-CM | POA: Diagnosis not present

## 2019-06-07 DIAGNOSIS — S52501D Unspecified fracture of the lower end of right radius, subsequent encounter for closed fracture with routine healing: Secondary | ICD-10-CM | POA: Diagnosis not present

## 2019-06-07 DIAGNOSIS — G8929 Other chronic pain: Secondary | ICD-10-CM | POA: Diagnosis not present

## 2019-06-07 DIAGNOSIS — Z9181 History of falling: Secondary | ICD-10-CM | POA: Diagnosis not present

## 2019-06-07 DIAGNOSIS — H9190 Unspecified hearing loss, unspecified ear: Secondary | ICD-10-CM | POA: Diagnosis not present

## 2019-06-07 DIAGNOSIS — S52611D Displaced fracture of right ulna styloid process, subsequent encounter for closed fracture with routine healing: Secondary | ICD-10-CM | POA: Diagnosis not present

## 2019-06-08 DIAGNOSIS — H9190 Unspecified hearing loss, unspecified ear: Secondary | ICD-10-CM | POA: Diagnosis not present

## 2019-06-08 DIAGNOSIS — S52501D Unspecified fracture of the lower end of right radius, subsequent encounter for closed fracture with routine healing: Secondary | ICD-10-CM | POA: Diagnosis not present

## 2019-06-08 DIAGNOSIS — M4810 Ankylosing hyperostosis [Forestier], site unspecified: Secondary | ICD-10-CM | POA: Diagnosis not present

## 2019-06-08 DIAGNOSIS — G8929 Other chronic pain: Secondary | ICD-10-CM | POA: Diagnosis not present

## 2019-06-08 DIAGNOSIS — M6282 Rhabdomyolysis: Secondary | ICD-10-CM | POA: Diagnosis not present

## 2019-06-08 DIAGNOSIS — S52611D Displaced fracture of right ulna styloid process, subsequent encounter for closed fracture with routine healing: Secondary | ICD-10-CM | POA: Diagnosis not present

## 2019-06-08 DIAGNOSIS — K219 Gastro-esophageal reflux disease without esophagitis: Secondary | ICD-10-CM | POA: Diagnosis not present

## 2019-06-08 DIAGNOSIS — Z9181 History of falling: Secondary | ICD-10-CM | POA: Diagnosis not present

## 2019-06-09 DIAGNOSIS — M150 Primary generalized (osteo)arthritis: Secondary | ICD-10-CM | POA: Diagnosis not present

## 2019-06-09 DIAGNOSIS — M179 Osteoarthritis of knee, unspecified: Secondary | ICD-10-CM | POA: Diagnosis not present

## 2019-06-09 DIAGNOSIS — G990 Autonomic neuropathy in diseases classified elsewhere: Secondary | ICD-10-CM | POA: Diagnosis not present

## 2019-06-12 DIAGNOSIS — M6282 Rhabdomyolysis: Secondary | ICD-10-CM | POA: Diagnosis not present

## 2019-06-12 DIAGNOSIS — K219 Gastro-esophageal reflux disease without esophagitis: Secondary | ICD-10-CM | POA: Diagnosis not present

## 2019-06-12 DIAGNOSIS — H9190 Unspecified hearing loss, unspecified ear: Secondary | ICD-10-CM | POA: Diagnosis not present

## 2019-06-12 DIAGNOSIS — M4810 Ankylosing hyperostosis [Forestier], site unspecified: Secondary | ICD-10-CM | POA: Diagnosis not present

## 2019-06-12 DIAGNOSIS — S52501D Unspecified fracture of the lower end of right radius, subsequent encounter for closed fracture with routine healing: Secondary | ICD-10-CM | POA: Diagnosis not present

## 2019-06-12 DIAGNOSIS — Z9181 History of falling: Secondary | ICD-10-CM | POA: Diagnosis not present

## 2019-06-12 DIAGNOSIS — S52611D Displaced fracture of right ulna styloid process, subsequent encounter for closed fracture with routine healing: Secondary | ICD-10-CM | POA: Diagnosis not present

## 2019-06-12 DIAGNOSIS — G8929 Other chronic pain: Secondary | ICD-10-CM | POA: Diagnosis not present

## 2019-06-13 DIAGNOSIS — H9190 Unspecified hearing loss, unspecified ear: Secondary | ICD-10-CM | POA: Diagnosis not present

## 2019-06-13 DIAGNOSIS — K219 Gastro-esophageal reflux disease without esophagitis: Secondary | ICD-10-CM | POA: Diagnosis not present

## 2019-06-13 DIAGNOSIS — G8929 Other chronic pain: Secondary | ICD-10-CM | POA: Diagnosis not present

## 2019-06-13 DIAGNOSIS — M6282 Rhabdomyolysis: Secondary | ICD-10-CM | POA: Diagnosis not present

## 2019-06-13 DIAGNOSIS — S52611D Displaced fracture of right ulna styloid process, subsequent encounter for closed fracture with routine healing: Secondary | ICD-10-CM | POA: Diagnosis not present

## 2019-06-13 DIAGNOSIS — M4810 Ankylosing hyperostosis [Forestier], site unspecified: Secondary | ICD-10-CM | POA: Diagnosis not present

## 2019-06-13 DIAGNOSIS — Z9181 History of falling: Secondary | ICD-10-CM | POA: Diagnosis not present

## 2019-06-13 DIAGNOSIS — S52501D Unspecified fracture of the lower end of right radius, subsequent encounter for closed fracture with routine healing: Secondary | ICD-10-CM | POA: Diagnosis not present

## 2019-06-14 ENCOUNTER — Telehealth: Payer: Self-pay | Admitting: Orthopedic Surgery

## 2019-06-14 DIAGNOSIS — Z299 Encounter for prophylactic measures, unspecified: Secondary | ICD-10-CM | POA: Diagnosis not present

## 2019-06-14 DIAGNOSIS — I10 Essential (primary) hypertension: Secondary | ICD-10-CM | POA: Diagnosis not present

## 2019-06-14 DIAGNOSIS — Z789 Other specified health status: Secondary | ICD-10-CM | POA: Diagnosis not present

## 2019-06-14 DIAGNOSIS — S62101S Fracture of unspecified carpal bone, right wrist, sequela: Secondary | ICD-10-CM | POA: Diagnosis not present

## 2019-06-14 NOTE — Telephone Encounter (Signed)
Call received from [facility] at [place] facility in [place]. States resident is new to their facility and has a "broken wrist". States their nurse practitioner recommends referral to Dr Aline Brochure - patient said seen in past. No records of previous treatment or Xrays, as he has just transferred there. States physical therapist also has seen him, and recommends orthopaedist.  I relayed that our providers need to review notes of previous care, including Xray reports, physician notes, and she states none are available. She will fax what she has from facility provider for Dr to review and advise.

## 2019-06-14 NOTE — Telephone Encounter (Signed)
Received notes from [place], which Dr Aline Brochure reviewed. Notes indicate initial treatment was at [facility]. Per Dr Aline Brochure, recommends orthopaedist in [place], Dr Case.  Called back to facility, left message for [facility], spoke with [place]. Also faxed note, as Ms. Cecilie Lowers has left for the day.

## 2019-06-15 DIAGNOSIS — Z9181 History of falling: Secondary | ICD-10-CM | POA: Diagnosis not present

## 2019-06-15 DIAGNOSIS — M6282 Rhabdomyolysis: Secondary | ICD-10-CM | POA: Diagnosis not present

## 2019-06-15 DIAGNOSIS — H9190 Unspecified hearing loss, unspecified ear: Secondary | ICD-10-CM | POA: Diagnosis not present

## 2019-06-15 DIAGNOSIS — S52501D Unspecified fracture of the lower end of right radius, subsequent encounter for closed fracture with routine healing: Secondary | ICD-10-CM | POA: Diagnosis not present

## 2019-06-15 DIAGNOSIS — G8929 Other chronic pain: Secondary | ICD-10-CM | POA: Diagnosis not present

## 2019-06-15 DIAGNOSIS — M4810 Ankylosing hyperostosis [Forestier], site unspecified: Secondary | ICD-10-CM | POA: Diagnosis not present

## 2019-06-15 DIAGNOSIS — S52611D Displaced fracture of right ulna styloid process, subsequent encounter for closed fracture with routine healing: Secondary | ICD-10-CM | POA: Diagnosis not present

## 2019-06-15 DIAGNOSIS — K219 Gastro-esophageal reflux disease without esophagitis: Secondary | ICD-10-CM | POA: Diagnosis not present

## 2019-06-18 DIAGNOSIS — M4810 Ankylosing hyperostosis [Forestier], site unspecified: Secondary | ICD-10-CM | POA: Diagnosis not present

## 2019-06-19 DIAGNOSIS — S52501D Unspecified fracture of the lower end of right radius, subsequent encounter for closed fracture with routine healing: Secondary | ICD-10-CM | POA: Diagnosis not present

## 2019-06-19 DIAGNOSIS — M4810 Ankylosing hyperostosis [Forestier], site unspecified: Secondary | ICD-10-CM | POA: Diagnosis not present

## 2019-06-19 DIAGNOSIS — K219 Gastro-esophageal reflux disease without esophagitis: Secondary | ICD-10-CM | POA: Diagnosis not present

## 2019-06-19 DIAGNOSIS — H9190 Unspecified hearing loss, unspecified ear: Secondary | ICD-10-CM | POA: Diagnosis not present

## 2019-06-19 DIAGNOSIS — M6282 Rhabdomyolysis: Secondary | ICD-10-CM | POA: Diagnosis not present

## 2019-06-19 DIAGNOSIS — G8929 Other chronic pain: Secondary | ICD-10-CM | POA: Diagnosis not present

## 2019-06-19 DIAGNOSIS — S52611D Displaced fracture of right ulna styloid process, subsequent encounter for closed fracture with routine healing: Secondary | ICD-10-CM | POA: Diagnosis not present

## 2019-06-19 DIAGNOSIS — Z9181 History of falling: Secondary | ICD-10-CM | POA: Diagnosis not present

## 2019-06-21 DIAGNOSIS — G8929 Other chronic pain: Secondary | ICD-10-CM | POA: Diagnosis not present

## 2019-06-21 DIAGNOSIS — S52611D Displaced fracture of right ulna styloid process, subsequent encounter for closed fracture with routine healing: Secondary | ICD-10-CM | POA: Diagnosis not present

## 2019-06-21 DIAGNOSIS — H9190 Unspecified hearing loss, unspecified ear: Secondary | ICD-10-CM | POA: Diagnosis not present

## 2019-06-21 DIAGNOSIS — K219 Gastro-esophageal reflux disease without esophagitis: Secondary | ICD-10-CM | POA: Diagnosis not present

## 2019-06-21 DIAGNOSIS — M4810 Ankylosing hyperostosis [Forestier], site unspecified: Secondary | ICD-10-CM | POA: Diagnosis not present

## 2019-06-21 DIAGNOSIS — M6282 Rhabdomyolysis: Secondary | ICD-10-CM | POA: Diagnosis not present

## 2019-06-21 DIAGNOSIS — Z9181 History of falling: Secondary | ICD-10-CM | POA: Diagnosis not present

## 2019-06-21 DIAGNOSIS — S52501D Unspecified fracture of the lower end of right radius, subsequent encounter for closed fracture with routine healing: Secondary | ICD-10-CM | POA: Diagnosis not present

## 2019-06-22 DIAGNOSIS — Z9181 History of falling: Secondary | ICD-10-CM | POA: Diagnosis not present

## 2019-06-22 DIAGNOSIS — H9190 Unspecified hearing loss, unspecified ear: Secondary | ICD-10-CM | POA: Diagnosis not present

## 2019-06-22 DIAGNOSIS — S52501D Unspecified fracture of the lower end of right radius, subsequent encounter for closed fracture with routine healing: Secondary | ICD-10-CM | POA: Diagnosis not present

## 2019-06-22 DIAGNOSIS — M4810 Ankylosing hyperostosis [Forestier], site unspecified: Secondary | ICD-10-CM | POA: Diagnosis not present

## 2019-06-22 DIAGNOSIS — G8929 Other chronic pain: Secondary | ICD-10-CM | POA: Diagnosis not present

## 2019-06-22 DIAGNOSIS — M6282 Rhabdomyolysis: Secondary | ICD-10-CM | POA: Diagnosis not present

## 2019-06-22 DIAGNOSIS — S52611D Displaced fracture of right ulna styloid process, subsequent encounter for closed fracture with routine healing: Secondary | ICD-10-CM | POA: Diagnosis not present

## 2019-06-22 DIAGNOSIS — K219 Gastro-esophageal reflux disease without esophagitis: Secondary | ICD-10-CM | POA: Diagnosis not present

## 2019-06-23 DIAGNOSIS — I10 Essential (primary) hypertension: Secondary | ICD-10-CM | POA: Diagnosis not present

## 2019-06-23 DIAGNOSIS — Z299 Encounter for prophylactic measures, unspecified: Secondary | ICD-10-CM | POA: Diagnosis not present

## 2019-06-23 DIAGNOSIS — Z1159 Encounter for screening for other viral diseases: Secondary | ICD-10-CM | POA: Diagnosis not present

## 2019-06-23 DIAGNOSIS — Z79899 Other long term (current) drug therapy: Secondary | ICD-10-CM | POA: Diagnosis not present

## 2019-06-23 DIAGNOSIS — G8929 Other chronic pain: Secondary | ICD-10-CM | POA: Diagnosis not present

## 2019-06-23 DIAGNOSIS — Z6833 Body mass index (BMI) 33.0-33.9, adult: Secondary | ICD-10-CM | POA: Diagnosis not present

## 2019-06-28 DIAGNOSIS — S52501D Unspecified fracture of the lower end of right radius, subsequent encounter for closed fracture with routine healing: Secondary | ICD-10-CM | POA: Diagnosis not present

## 2019-06-28 DIAGNOSIS — G8929 Other chronic pain: Secondary | ICD-10-CM | POA: Diagnosis not present

## 2019-06-28 DIAGNOSIS — H9190 Unspecified hearing loss, unspecified ear: Secondary | ICD-10-CM | POA: Diagnosis not present

## 2019-06-28 DIAGNOSIS — Z299 Encounter for prophylactic measures, unspecified: Secondary | ICD-10-CM | POA: Diagnosis not present

## 2019-06-28 DIAGNOSIS — Z6833 Body mass index (BMI) 33.0-33.9, adult: Secondary | ICD-10-CM | POA: Diagnosis not present

## 2019-06-28 DIAGNOSIS — Z9181 History of falling: Secondary | ICD-10-CM | POA: Diagnosis not present

## 2019-06-28 DIAGNOSIS — K219 Gastro-esophageal reflux disease without esophagitis: Secondary | ICD-10-CM | POA: Diagnosis not present

## 2019-06-28 DIAGNOSIS — I10 Essential (primary) hypertension: Secondary | ICD-10-CM | POA: Diagnosis not present

## 2019-06-28 DIAGNOSIS — S52611D Displaced fracture of right ulna styloid process, subsequent encounter for closed fracture with routine healing: Secondary | ICD-10-CM | POA: Diagnosis not present

## 2019-06-28 DIAGNOSIS — Z713 Dietary counseling and surveillance: Secondary | ICD-10-CM | POA: Diagnosis not present

## 2019-06-28 DIAGNOSIS — M6282 Rhabdomyolysis: Secondary | ICD-10-CM | POA: Diagnosis not present

## 2019-06-28 DIAGNOSIS — M4810 Ankylosing hyperostosis [Forestier], site unspecified: Secondary | ICD-10-CM | POA: Diagnosis not present

## 2019-06-30 DIAGNOSIS — Z9181 History of falling: Secondary | ICD-10-CM | POA: Diagnosis not present

## 2019-06-30 DIAGNOSIS — H9190 Unspecified hearing loss, unspecified ear: Secondary | ICD-10-CM | POA: Diagnosis not present

## 2019-06-30 DIAGNOSIS — M6282 Rhabdomyolysis: Secondary | ICD-10-CM | POA: Diagnosis not present

## 2019-06-30 DIAGNOSIS — K219 Gastro-esophageal reflux disease without esophagitis: Secondary | ICD-10-CM | POA: Diagnosis not present

## 2019-06-30 DIAGNOSIS — M4810 Ankylosing hyperostosis [Forestier], site unspecified: Secondary | ICD-10-CM | POA: Diagnosis not present

## 2019-06-30 DIAGNOSIS — S52501D Unspecified fracture of the lower end of right radius, subsequent encounter for closed fracture with routine healing: Secondary | ICD-10-CM | POA: Diagnosis not present

## 2019-06-30 DIAGNOSIS — G8929 Other chronic pain: Secondary | ICD-10-CM | POA: Diagnosis not present

## 2019-06-30 DIAGNOSIS — S52611D Displaced fracture of right ulna styloid process, subsequent encounter for closed fracture with routine healing: Secondary | ICD-10-CM | POA: Diagnosis not present

## 2019-07-03 DIAGNOSIS — K219 Gastro-esophageal reflux disease without esophagitis: Secondary | ICD-10-CM | POA: Diagnosis not present

## 2019-07-03 DIAGNOSIS — G8929 Other chronic pain: Secondary | ICD-10-CM | POA: Diagnosis not present

## 2019-07-03 DIAGNOSIS — M6282 Rhabdomyolysis: Secondary | ICD-10-CM | POA: Diagnosis not present

## 2019-07-03 DIAGNOSIS — Z9181 History of falling: Secondary | ICD-10-CM | POA: Diagnosis not present

## 2019-07-03 DIAGNOSIS — S52501D Unspecified fracture of the lower end of right radius, subsequent encounter for closed fracture with routine healing: Secondary | ICD-10-CM | POA: Diagnosis not present

## 2019-07-03 DIAGNOSIS — H9190 Unspecified hearing loss, unspecified ear: Secondary | ICD-10-CM | POA: Diagnosis not present

## 2019-07-03 DIAGNOSIS — M4810 Ankylosing hyperostosis [Forestier], site unspecified: Secondary | ICD-10-CM | POA: Diagnosis not present

## 2019-07-03 DIAGNOSIS — S52611D Displaced fracture of right ulna styloid process, subsequent encounter for closed fracture with routine healing: Secondary | ICD-10-CM | POA: Diagnosis not present

## 2019-07-04 DIAGNOSIS — S52611D Displaced fracture of right ulna styloid process, subsequent encounter for closed fracture with routine healing: Secondary | ICD-10-CM | POA: Diagnosis not present

## 2019-07-04 DIAGNOSIS — G8929 Other chronic pain: Secondary | ICD-10-CM | POA: Diagnosis not present

## 2019-07-04 DIAGNOSIS — M6282 Rhabdomyolysis: Secondary | ICD-10-CM | POA: Diagnosis not present

## 2019-07-04 DIAGNOSIS — Z9181 History of falling: Secondary | ICD-10-CM | POA: Diagnosis not present

## 2019-07-04 DIAGNOSIS — H9190 Unspecified hearing loss, unspecified ear: Secondary | ICD-10-CM | POA: Diagnosis not present

## 2019-07-04 DIAGNOSIS — K219 Gastro-esophageal reflux disease without esophagitis: Secondary | ICD-10-CM | POA: Diagnosis not present

## 2019-07-04 DIAGNOSIS — S52501D Unspecified fracture of the lower end of right radius, subsequent encounter for closed fracture with routine healing: Secondary | ICD-10-CM | POA: Diagnosis not present

## 2019-07-04 DIAGNOSIS — M4810 Ankylosing hyperostosis [Forestier], site unspecified: Secondary | ICD-10-CM | POA: Diagnosis not present

## 2019-07-05 DIAGNOSIS — M4810 Ankylosing hyperostosis [Forestier], site unspecified: Secondary | ICD-10-CM | POA: Diagnosis not present

## 2019-07-05 DIAGNOSIS — Z9181 History of falling: Secondary | ICD-10-CM | POA: Diagnosis not present

## 2019-07-05 DIAGNOSIS — H9190 Unspecified hearing loss, unspecified ear: Secondary | ICD-10-CM | POA: Diagnosis not present

## 2019-07-05 DIAGNOSIS — K219 Gastro-esophageal reflux disease without esophagitis: Secondary | ICD-10-CM | POA: Diagnosis not present

## 2019-07-05 DIAGNOSIS — S52611D Displaced fracture of right ulna styloid process, subsequent encounter for closed fracture with routine healing: Secondary | ICD-10-CM | POA: Diagnosis not present

## 2019-07-05 DIAGNOSIS — M6282 Rhabdomyolysis: Secondary | ICD-10-CM | POA: Diagnosis not present

## 2019-07-05 DIAGNOSIS — S52501D Unspecified fracture of the lower end of right radius, subsequent encounter for closed fracture with routine healing: Secondary | ICD-10-CM | POA: Diagnosis not present

## 2019-07-05 DIAGNOSIS — G8929 Other chronic pain: Secondary | ICD-10-CM | POA: Diagnosis not present

## 2019-07-07 DIAGNOSIS — M25531 Pain in right wrist: Secondary | ICD-10-CM | POA: Diagnosis not present

## 2019-07-10 DIAGNOSIS — G990 Autonomic neuropathy in diseases classified elsewhere: Secondary | ICD-10-CM | POA: Diagnosis not present

## 2019-07-10 DIAGNOSIS — M150 Primary generalized (osteo)arthritis: Secondary | ICD-10-CM | POA: Diagnosis not present

## 2019-07-10 DIAGNOSIS — M179 Osteoarthritis of knee, unspecified: Secondary | ICD-10-CM | POA: Diagnosis not present

## 2019-07-11 DIAGNOSIS — M4810 Ankylosing hyperostosis [Forestier], site unspecified: Secondary | ICD-10-CM | POA: Diagnosis not present

## 2019-07-11 DIAGNOSIS — S52611D Displaced fracture of right ulna styloid process, subsequent encounter for closed fracture with routine healing: Secondary | ICD-10-CM | POA: Diagnosis not present

## 2019-07-11 DIAGNOSIS — M6282 Rhabdomyolysis: Secondary | ICD-10-CM | POA: Diagnosis not present

## 2019-07-11 DIAGNOSIS — Z9181 History of falling: Secondary | ICD-10-CM | POA: Diagnosis not present

## 2019-07-11 DIAGNOSIS — H9190 Unspecified hearing loss, unspecified ear: Secondary | ICD-10-CM | POA: Diagnosis not present

## 2019-07-11 DIAGNOSIS — K219 Gastro-esophageal reflux disease without esophagitis: Secondary | ICD-10-CM | POA: Diagnosis not present

## 2019-07-11 DIAGNOSIS — S52501D Unspecified fracture of the lower end of right radius, subsequent encounter for closed fracture with routine healing: Secondary | ICD-10-CM | POA: Diagnosis not present

## 2019-07-11 DIAGNOSIS — G8929 Other chronic pain: Secondary | ICD-10-CM | POA: Diagnosis not present

## 2019-07-12 DIAGNOSIS — M6282 Rhabdomyolysis: Secondary | ICD-10-CM | POA: Diagnosis not present

## 2019-07-12 DIAGNOSIS — S52611D Displaced fracture of right ulna styloid process, subsequent encounter for closed fracture with routine healing: Secondary | ICD-10-CM | POA: Diagnosis not present

## 2019-07-12 DIAGNOSIS — K219 Gastro-esophageal reflux disease without esophagitis: Secondary | ICD-10-CM | POA: Diagnosis not present

## 2019-07-12 DIAGNOSIS — G8929 Other chronic pain: Secondary | ICD-10-CM | POA: Diagnosis not present

## 2019-07-12 DIAGNOSIS — M4810 Ankylosing hyperostosis [Forestier], site unspecified: Secondary | ICD-10-CM | POA: Diagnosis not present

## 2019-07-12 DIAGNOSIS — S52501D Unspecified fracture of the lower end of right radius, subsequent encounter for closed fracture with routine healing: Secondary | ICD-10-CM | POA: Diagnosis not present

## 2019-07-12 DIAGNOSIS — Z9181 History of falling: Secondary | ICD-10-CM | POA: Diagnosis not present

## 2019-07-12 DIAGNOSIS — H9190 Unspecified hearing loss, unspecified ear: Secondary | ICD-10-CM | POA: Diagnosis not present

## 2019-07-17 DIAGNOSIS — Z9181 History of falling: Secondary | ICD-10-CM | POA: Diagnosis not present

## 2019-07-17 DIAGNOSIS — S52501D Unspecified fracture of the lower end of right radius, subsequent encounter for closed fracture with routine healing: Secondary | ICD-10-CM | POA: Diagnosis not present

## 2019-07-17 DIAGNOSIS — K219 Gastro-esophageal reflux disease without esophagitis: Secondary | ICD-10-CM | POA: Diagnosis not present

## 2019-07-17 DIAGNOSIS — M6282 Rhabdomyolysis: Secondary | ICD-10-CM | POA: Diagnosis not present

## 2019-07-17 DIAGNOSIS — S52611D Displaced fracture of right ulna styloid process, subsequent encounter for closed fracture with routine healing: Secondary | ICD-10-CM | POA: Diagnosis not present

## 2019-07-17 DIAGNOSIS — H9190 Unspecified hearing loss, unspecified ear: Secondary | ICD-10-CM | POA: Diagnosis not present

## 2019-07-17 DIAGNOSIS — G8929 Other chronic pain: Secondary | ICD-10-CM | POA: Diagnosis not present

## 2019-07-17 DIAGNOSIS — M4810 Ankylosing hyperostosis [Forestier], site unspecified: Secondary | ICD-10-CM | POA: Diagnosis not present

## 2019-07-19 DIAGNOSIS — K219 Gastro-esophageal reflux disease without esophagitis: Secondary | ICD-10-CM | POA: Diagnosis not present

## 2019-07-19 DIAGNOSIS — Z9181 History of falling: Secondary | ICD-10-CM | POA: Diagnosis not present

## 2019-07-19 DIAGNOSIS — M6282 Rhabdomyolysis: Secondary | ICD-10-CM | POA: Diagnosis not present

## 2019-07-19 DIAGNOSIS — Z6833 Body mass index (BMI) 33.0-33.9, adult: Secondary | ICD-10-CM | POA: Diagnosis not present

## 2019-07-19 DIAGNOSIS — S52501D Unspecified fracture of the lower end of right radius, subsequent encounter for closed fracture with routine healing: Secondary | ICD-10-CM | POA: Diagnosis not present

## 2019-07-19 DIAGNOSIS — M4810 Ankylosing hyperostosis [Forestier], site unspecified: Secondary | ICD-10-CM | POA: Diagnosis not present

## 2019-07-19 DIAGNOSIS — I10 Essential (primary) hypertension: Secondary | ICD-10-CM | POA: Diagnosis not present

## 2019-07-19 DIAGNOSIS — H9190 Unspecified hearing loss, unspecified ear: Secondary | ICD-10-CM | POA: Diagnosis not present

## 2019-07-19 DIAGNOSIS — Z299 Encounter for prophylactic measures, unspecified: Secondary | ICD-10-CM | POA: Diagnosis not present

## 2019-07-19 DIAGNOSIS — S52611D Displaced fracture of right ulna styloid process, subsequent encounter for closed fracture with routine healing: Secondary | ICD-10-CM | POA: Diagnosis not present

## 2019-07-19 DIAGNOSIS — Z713 Dietary counseling and surveillance: Secondary | ICD-10-CM | POA: Diagnosis not present

## 2019-07-19 DIAGNOSIS — G8929 Other chronic pain: Secondary | ICD-10-CM | POA: Diagnosis not present

## 2019-07-26 DIAGNOSIS — Z713 Dietary counseling and surveillance: Secondary | ICD-10-CM | POA: Diagnosis not present

## 2019-07-26 DIAGNOSIS — Z6833 Body mass index (BMI) 33.0-33.9, adult: Secondary | ICD-10-CM | POA: Diagnosis not present

## 2019-07-26 DIAGNOSIS — G8929 Other chronic pain: Secondary | ICD-10-CM | POA: Diagnosis not present

## 2019-07-26 DIAGNOSIS — Z299 Encounter for prophylactic measures, unspecified: Secondary | ICD-10-CM | POA: Diagnosis not present

## 2019-08-02 DIAGNOSIS — G8929 Other chronic pain: Secondary | ICD-10-CM | POA: Diagnosis not present

## 2019-08-02 DIAGNOSIS — I10 Essential (primary) hypertension: Secondary | ICD-10-CM | POA: Diagnosis not present

## 2019-08-02 DIAGNOSIS — Z713 Dietary counseling and surveillance: Secondary | ICD-10-CM | POA: Diagnosis not present

## 2019-08-02 DIAGNOSIS — Z6826 Body mass index (BMI) 26.0-26.9, adult: Secondary | ICD-10-CM | POA: Diagnosis not present

## 2019-08-02 DIAGNOSIS — Z299 Encounter for prophylactic measures, unspecified: Secondary | ICD-10-CM | POA: Diagnosis not present

## 2019-08-09 DIAGNOSIS — I10 Essential (primary) hypertension: Secondary | ICD-10-CM | POA: Diagnosis not present

## 2019-08-09 DIAGNOSIS — Z299 Encounter for prophylactic measures, unspecified: Secondary | ICD-10-CM | POA: Diagnosis not present

## 2019-08-09 DIAGNOSIS — Z6833 Body mass index (BMI) 33.0-33.9, adult: Secondary | ICD-10-CM | POA: Diagnosis not present

## 2019-08-09 DIAGNOSIS — G8929 Other chronic pain: Secondary | ICD-10-CM | POA: Diagnosis not present

## 2019-08-09 DIAGNOSIS — M4810 Ankylosing hyperostosis [Forestier], site unspecified: Secondary | ICD-10-CM | POA: Diagnosis not present

## 2019-08-10 DIAGNOSIS — M179 Osteoarthritis of knee, unspecified: Secondary | ICD-10-CM | POA: Diagnosis not present

## 2019-08-10 DIAGNOSIS — M150 Primary generalized (osteo)arthritis: Secondary | ICD-10-CM | POA: Diagnosis not present

## 2019-08-10 DIAGNOSIS — G990 Autonomic neuropathy in diseases classified elsewhere: Secondary | ICD-10-CM | POA: Diagnosis not present

## 2019-08-19 DIAGNOSIS — M4810 Ankylosing hyperostosis [Forestier], site unspecified: Secondary | ICD-10-CM | POA: Diagnosis not present

## 2019-08-23 DIAGNOSIS — H6121 Impacted cerumen, right ear: Secondary | ICD-10-CM | POA: Diagnosis not present

## 2019-08-23 DIAGNOSIS — Z713 Dietary counseling and surveillance: Secondary | ICD-10-CM | POA: Diagnosis not present

## 2019-08-23 DIAGNOSIS — I10 Essential (primary) hypertension: Secondary | ICD-10-CM | POA: Diagnosis not present

## 2019-08-23 DIAGNOSIS — Z299 Encounter for prophylactic measures, unspecified: Secondary | ICD-10-CM | POA: Diagnosis not present

## 2019-08-23 DIAGNOSIS — Z6833 Body mass index (BMI) 33.0-33.9, adult: Secondary | ICD-10-CM | POA: Diagnosis not present

## 2019-08-29 ENCOUNTER — Telehealth: Payer: Self-pay | Admitting: Neurology

## 2019-08-29 ENCOUNTER — Encounter

## 2019-08-29 ENCOUNTER — Telehealth: Payer: Self-pay

## 2019-08-29 ENCOUNTER — Ambulatory Visit: Payer: Medicare HMO | Admitting: Neurology

## 2019-08-29 ENCOUNTER — Other Ambulatory Visit: Payer: Self-pay

## 2019-08-29 ENCOUNTER — Encounter: Payer: Self-pay | Admitting: Neurology

## 2019-08-29 DIAGNOSIS — G3281 Cerebellar ataxia in diseases classified elsewhere: Secondary | ICD-10-CM

## 2019-08-29 DIAGNOSIS — R269 Unspecified abnormalities of gait and mobility: Secondary | ICD-10-CM | POA: Diagnosis not present

## 2019-08-29 HISTORY — DX: Unspecified abnormalities of gait and mobility: R26.9

## 2019-08-29 MED ORDER — DIVALPROEX SODIUM 500 MG PO DR TAB: 500.0000 mg | DELAYED_RELEASE_TABLET | Freq: Two times a day (BID) | ORAL | 2 refills | Status: DC

## 2019-08-29 NOTE — Progress Notes (Signed)
Reason for visit: Movement disorder  Referring physician: Dr. Fulton Mole [name] is a 68 y.o. male  History of present illness:  [name] is a 68 year old right-handed white male with a history of chronic pain syndrome associated with DISH.  The patient has been on chronic opiate and benzodiazepine therapy.  On 19 Apr 2019, the patient apparently fell at home and was unconscious on the floor for greater than 12 hours.  The patient was taken to the hospital, he was felt to have had a medication overdose, a urine drug screen however was never done.  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  The patient himself has very little recollection about what was going on around this time.  The patient went to an extended care facility but went back to [facility] for ongoing detoxification.  The family indicates that an MRI of the brain was done at that time, I do not have the report of this.  The patient has been at [facility], since being there in July 2020, the patient has had jerking and twitching of the arms and legs, he has not been able to effectively walk.  The patient recalls having initially numbness from the mid chest level down with difficulty controlling the bowels and the bladder.  He has reported some mild troubles with word finding and memory.  The patient has not had any difficulty with swallowing or choking.  He currently is able to control the bowels and bladder much better, the numbness in the chest has gotten better.  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  He gives a history of a vitamin B12 deficiency previously.  The patient has not had any weakness of the arms or legs.  He reports no loss of vision but he does have some flashing lights in the periphery of the vision  bilaterally.  He is sent to this office for further evaluation.  Past Medical History:  Diagnosis Date   Cataract    Chronic pain    DISH (diffuse idiopathic skeletal hyperostosis)    GERD (gastroesophageal reflux disease)    Impaired hearing     Past Surgical History:  Procedure Laterality Date   BACK SURGERY     X2   CATARACT EXTRACTION W/PHACO  12/03/2011   Procedure: CATARACT EXTRACTION PHACO AND INTRAOCULAR LENS PLACEMENT (IOC);  Surgeon: Gemma Payor;  Location: [facility];  Service: Ophthalmology;  Laterality: Right;  CDE: 7.86   CATARACT EXTRACTION W/PHACO  12/21/2011   Procedure: CATARACT EXTRACTION PHACO AND INTRAOCULAR LENS PLACEMENT (IOC);  Surgeon: Gemma Payor, MD;  Location: [facility];  Service: Ophthalmology;  Laterality: Left;  CDE=5.51   KNEE ARTHROSCOPY W/ ACL RECONSTRUCTION  05/2000   right, GSBO   KNEE SURGERY  10/2000   remove screw after ACL   LAMINECTOMY     TUMOR EXCISION     back, cyst    Family History  Problem Relation Age of Onset   Anesthesia problems Neg Hx    Hypotension Neg Hx    Malignant hyperthermia Neg Hx    Pseudochol deficiency Neg Hx     Social history:  reports that he has never smoked. His smokeless tobacco use includes chew. He reports that he does not drink alcohol or use drugs.  Medications:  Prior to  Admission medications   Medication Sig Start Date End Date Taking? Authorizing Provider  ALPRAZolam Duanne Moron) 0.5 MG tablet Take 1 tablet (0.5 mg total) by mouth 2 (two) times daily as needed for anxiety. 04/26/19  Yes Tat, Shanon Brow, MD  carbamide peroxide (DEBROX) 6.5 % OTIC solution 5 drops 2 (two) times daily.   Yes [provider]  cetirizine (ZYRTEC) 10 MG tablet Take 10 mg by mouth daily.   Yes [provider]  DULoxetine (CYMBALTA) 60 MG capsule Take 30 mg by mouth 2 (two) times daily.    Yes [provider]  fentaNYL (DURAGESIC) 12 MCG/HR  08/25/19  Yes [provider]  gabapentin  (NEURONTIN) 300 MG capsule 300 mg 2 (two) times daily.  08/24/19  Yes [provider]  HYDROcodone-acetaminophen (NORCO/VICODIN) 5-325 MG tablet  08/25/19  Yes [provider]  ketoconazole (NIZORAL) 2 % shampoo Apply 1 application topically daily as needed for irritation.  12/16/18  Yes [provider]  lansoprazole (PREVACID) 30 MG capsule Take 30 mg by mouth daily.    Yes [provider]  lisinopril (ZESTRIL) 5 MG tablet Take 10 mg by mouth daily.  04/17/19  Yes [provider]  temazepam (RESTORIL) 15 MG capsule Take 1 capsule (15 mg total) by mouth at bedtime as needed for sleep. 04/26/19  Yes Tat, Shanon Brow, MD  morphine (MS CONTIN) 15 MG 12 hr tablet Take 1 tablet (15 mg total) by mouth 2 (two) times daily. Patient not taking: Reported on 08/29/2019 04/26/19   Orson Eva, MD  sildenafil (VIAGRA) 100 MG tablet Take 1 tablet by mouth daily as needed for erectile dysfunction.  01/17/19   [provider]      Allergies  Allergen Reactions   Ceclor [Cefaclor] Other (See Comments)    Keeps patient wake for days.    Nsaids Other (See Comments)    G.I. Upset   Other Other (See Comments)    Steroids make patient extremely agitated     ROS:  Out of a complete 14 system review of symptoms, the patient complains only of the following symptoms, and all other reviewed systems are negative.  Walking problem, jerking Word finding problems Numbness  Blood pressure 140/82, pulse (!) 105, temperature (!) 96.9 F (36.1 C), temperature source Temporal, weight 262 lb (118.8 kg), SpO2 96 %.  Physical Exam  General: The patient is alert and cooperative at the time of the examination.  Eyes: Pupils are equal, round, and reactive to light. Discs are flat bilaterally.  Neck: The neck is supple, no carotid bruits are noted.  Respiratory: The respiratory examination is clear.  Cardiovascular: The cardiovascular examination reveals a regular rate and  rhythm, no obvious murmurs or rubs are noted.  Skin: Extremities are without significant edema.  Neurologic Exam  Mental status: The patient is alert and oriented x 3 at the time of the examination. The patient has apparent normal recent and remote memory, with an apparently normal attention span and concentration ability.  Cranial nerves: Facial symmetry is present. There is good sensation of the face to pinprick and soft touch bilaterally. The strength of the facial muscles and the muscles to head turning and shoulder shrug are normal bilaterally. Speech is well enunciated, no aphasia or dysarthria is noted. Extraocular movements are full. Visual fields are full. The tongue is midline, and the patient has symmetric elevation of the soft palate. No obvious hearing deficits are noted.  Motor: The motor testing reveals 5 over 5 strength  of all 4 extremities. Good symmetric motor tone is noted throughout.  Sensory: Sensory testing is intact to pinprick, soft touch, vibration sensation, and position sense on the upper extremities.  With the lower extremities, there is a stocking pattern pinprick sensory deficit up to the knees, with marked reduction in vibration and position sense in both feet. No evidence of extinction is noted.  Coordination: Cerebellar testing reveals good finger-nose-finger and heel-to-shin bilaterally, but the patient demonstrates jerking movements with the arms and legs, some titubations type movements of the axial muscles.  These movements are highly distractible, at times they are essentially gone, other times quite prominent associated with some vocalizations.  Gait and station: The patient will stand with assistance, gait is wide-based, he is jerky with movements with walking.  Tandem gait was not attempted.  Reflexes: Deep tendon reflexes are symmetric, but are depressed bilaterally. Toes are downgoing bilaterally.   Assessment/Plan:  1.  Movement disorder  2.   Episode of drug overdose, rhabdomyolysis, subsequent drug withdrawal  3.  Gait disorder  4.  Possible peripheral neuropathy  The patient has an unusual movement disorder that has developed, the timing to his episode of loss of consciousness on 19 Apr 2019 is not clear.  It is possible the patient could have suffered an anoxic brain injury with the event, but many features of the movement disorder appear to be functional in nature.  It is possible that much of this may be behavioral rather than secondary to an organic neurologic issue.  The patient will be set up for MRI of the cervical spine and thoracic spine given the history of fecal and urinary incontinence and a sensory level around the T5 level.  The patient will have nerve conductions of both legs and one arm, EMG on one leg.  He will be placed on Depakote taking 500 mg twice daily.  He will follow-up for the EMG, and have a revisit in about 3 months.  Marlan Palau MD 08/29/2019 11:44 AM  [facility] [address] [place], [place] [address]  Phone [phone] Fax [phone]

## 2019-08-29 NOTE — Telephone Encounter (Signed)
-----   Message from Kathrynn Ducking, MD sent at 08/29/2019  1:12 PM EDT ----- This patient apparently had MRI of the brain at [facility], please call and get the result, the study should have been done sometime in June of this year.  Thank you.

## 2019-08-29 NOTE — Telephone Encounter (Signed)
[employer] medicare order sent to [facility]. They will obtain the auth and reach out to the patient to schedule.  °

## 2019-08-29 NOTE — Telephone Encounter (Signed)
Done

## 2019-08-30 ENCOUNTER — Telehealth: Payer: Self-pay | Admitting: Neurology

## 2019-08-30 NOTE — Telephone Encounter (Signed)
    MRI of the brain was done at [facility] 05/08/19:  MRI showed mild cortical atrophy, otherwise the study was normal.

## 2019-08-31 ENCOUNTER — Telehealth: Payer: Self-pay | Admitting: Neurology

## 2019-08-31 NOTE — Addendum Note (Signed)
Addended by: Kathrynn Ducking on: 08/31/2019 06:11 PM   Modules accepted: Orders

## 2019-08-31 NOTE — Telephone Encounter (Signed)
[name] from [facility] in [place] called needing to speak to provider or RN about some information she had about the new medication the pt was given on his last appt. Please advise.

## 2019-08-31 NOTE — Telephone Encounter (Signed)
Lvm asking pt's caretaker Malachy Mood to call me back.

## 2019-08-31 NOTE — Telephone Encounter (Signed)
Pt caretaker is asking for a call from RN to discuss the last most recent medication called in for pt.  Caretaker said pt can not take it because it made him sick. Please call

## 2019-08-31 NOTE — Telephone Encounter (Signed)
pt's caretaker Malachy Mood has called RN Jinny Blossom back, please call

## 2019-08-31 NOTE — Telephone Encounter (Addendum)
I contacted [name] back and we discussed the message from [place]. I advised Dr. Jannifer Franklin has been notified and will review message. She was agreeable no call back needed.

## 2019-08-31 NOTE — Telephone Encounter (Signed)
[name] nurse at [facility] also called to discuss the depakote 500 mg that was started on 08/29/2019. I returned [name]'s call and she states the pt took the evening dosage of depakote on 08/29/2019 and both dosages yesterday but refused the medication today. He reported to [place] he felt like the medication was making his shaking worse and he would no longer take the medication.

## 2019-08-31 NOTE — Telephone Encounter (Signed)
Noted, [employer] medicare pending for [employer]. Faxed notes.

## 2019-08-31 NOTE — Telephone Encounter (Addendum)
[name] nurse at [facility] also called to discuss the depakote 500 mg that was started on 08/29/2019. I returned [name]'s call and she states the pt took the evening dosage of depakote on 08/29/2019 and both dosages yesterday but refused the medication today. He reported to [name] he felt like the medication was making his shaking worse and he would no longer take the medication.  

## 2019-08-31 NOTE — Telephone Encounter (Signed)
I called Malachy Mood the caretaker, the Depakote results and stomach upset, they will stop the medication.  I will not restart another medication for now, we will complete the work-up.

## 2019-08-31 NOTE — Telephone Encounter (Signed)
[name] nurse at [facility] also called to discuss the depakote 500 mg that was started on 08/29/2019. I returned [name]'s call and she states the pt took the evening dosage of depakote on 08/29/2019 and both dosages yesterday but refused the medication today. He reported to [place] he felt like the medication was making his shaking worse and he would no longer take the medication. [facility]'s phone # is [phone]. I have fwd message to Dr. Jannifer Franklin via separate telephone encounter.

## 2019-08-31 NOTE — Telephone Encounter (Signed)
Pt care taker is asking if the MRI can be done in [place], she is asking for a call to discuss

## 2019-09-04 ENCOUNTER — Telehealth: Payer: Self-pay | Admitting: Adult Health

## 2019-09-04 ENCOUNTER — Telehealth: Payer: Self-pay

## 2019-09-04 NOTE — Telephone Encounter (Signed)
Melissa from [facility] was returning a call she received. She stated best call back number [phone] option 3

## 2019-09-04 NOTE — Telephone Encounter (Signed)
LVM for [facility] to call back to schedule  Bernadene Person auth: [id number] (exp. 09-01-19 to 02-28-20) E

## 2019-09-04 NOTE — Telephone Encounter (Signed)
Alyse Low, nurse at [facility] called to request MRI be scheduled at [facility].  I advised [name] called schedulers at [facility] today and left a vm asking for a call back.  Alyse Low is asking to be called at # [phone] once appt is scheduled so she can have the pt's information packet ready.

## 2019-09-04 NOTE — Telephone Encounter (Signed)
I attempted to reach [facility] and advised on MRI date she was not available will call back at a later time.

## 2019-09-04 NOTE — Telephone Encounter (Signed)
Patient is scheduled at [facility] for Friday 09/08/19 arrival time is 10:00 AM

## 2019-09-04 NOTE — Telephone Encounter (Signed)
Left a voicemail on patient caretaker voicemail he is scheduled at [facility] for 09/08/19 arrival time is 10:00 AM. Check in at the main entrance check in on the desk on the left. I also left their number of [phone]. °

## 2019-09-04 NOTE — Telephone Encounter (Signed)
LVM with [facility] to call me back to schedule his MRI's.

## 2019-09-04 NOTE — Telephone Encounter (Signed)
[name] with [name] called and left a voice message regarding [name]. She has questions about referral for MRI.  Please call back at [phone]

## 2019-09-04 NOTE — Telephone Encounter (Signed)
Left a voicemail on patient caretaker voicemail he is scheduled at [facility] for 09/08/19 arrival time is 10:00 AM. Check in at the main entrance check in on the desk on the left. I also left their number of [phone].

## 2019-09-05 NOTE — Telephone Encounter (Signed)
[name] at [facility] notified of MRI appt.

## 2019-09-06 DIAGNOSIS — Z6833 Body mass index (BMI) 33.0-33.9, adult: Secondary | ICD-10-CM | POA: Diagnosis not present

## 2019-09-06 DIAGNOSIS — Z299 Encounter for prophylactic measures, unspecified: Secondary | ICD-10-CM | POA: Diagnosis not present

## 2019-09-06 DIAGNOSIS — G4700 Insomnia, unspecified: Secondary | ICD-10-CM | POA: Diagnosis not present

## 2019-09-06 DIAGNOSIS — I10 Essential (primary) hypertension: Secondary | ICD-10-CM | POA: Diagnosis not present

## 2019-09-06 DIAGNOSIS — G8929 Other chronic pain: Secondary | ICD-10-CM | POA: Diagnosis not present

## 2019-09-06 DIAGNOSIS — H6121 Impacted cerumen, right ear: Secondary | ICD-10-CM | POA: Diagnosis not present

## 2019-09-08 DIAGNOSIS — M4802 Spinal stenosis, cervical region: Secondary | ICD-10-CM | POA: Diagnosis not present

## 2019-09-08 DIAGNOSIS — G3281 Cerebellar ataxia in diseases classified elsewhere: Secondary | ICD-10-CM | POA: Diagnosis not present

## 2019-09-08 DIAGNOSIS — M4812 Ankylosing hyperostosis [Forestier], cervical region: Secondary | ICD-10-CM | POA: Diagnosis not present

## 2019-09-08 DIAGNOSIS — M5134 Other intervertebral disc degeneration, thoracic region: Secondary | ICD-10-CM | POA: Diagnosis not present

## 2019-09-11 ENCOUNTER — Telehealth: Payer: Self-pay | Admitting: Neurology

## 2019-09-11 NOTE — Telephone Encounter (Signed)
I called patient and LVM to reschedule 10/27 ncs/emg due to Elmyra Ricks being out. Requested patient call back to r/s.

## 2019-09-11 NOTE — Telephone Encounter (Signed)
Care taker is asking if since pt cant walk can the NCV/EMG be done some whrere in [place], please call

## 2019-09-11 NOTE — Telephone Encounter (Signed)
This is a question for Dr. Jannifer Franklin.

## 2019-09-11 NOTE — Telephone Encounter (Signed)
I called, left a message, Dr. Lily Lovings can I think do EMG nerve conduction studies, I would prefer to be the one to do them however, if they are unable to manage this, I will get the EMG study set up there.

## 2019-09-12 ENCOUNTER — Telehealth: Payer: Self-pay | Admitting: Neurology

## 2019-09-12 NOTE — Telephone Encounter (Signed)
MRI of the thoracic spine appears unremarkable, I have not yet received the results of the MRI of the cervical spine.   MRI thoracic spine 09/08/19:  Impression:  No acute abnormality or finding to explain the patient's symptoms. DISH. Bilateral foraminal narrowing at T1-2.  The foramina are otherwise patent.  The central canal is patent at all levels.

## 2019-09-13 DIAGNOSIS — Z299 Encounter for prophylactic measures, unspecified: Secondary | ICD-10-CM | POA: Diagnosis not present

## 2019-09-13 DIAGNOSIS — H6690 Otitis media, unspecified, unspecified ear: Secondary | ICD-10-CM | POA: Diagnosis not present

## 2019-09-13 DIAGNOSIS — I10 Essential (primary) hypertension: Secondary | ICD-10-CM | POA: Diagnosis not present

## 2019-09-13 DIAGNOSIS — G4700 Insomnia, unspecified: Secondary | ICD-10-CM | POA: Diagnosis not present

## 2019-09-13 DIAGNOSIS — Z6833 Body mass index (BMI) 33.0-33.9, adult: Secondary | ICD-10-CM | POA: Diagnosis not present

## 2019-09-18 DIAGNOSIS — M4810 Ankylosing hyperostosis [Forestier], site unspecified: Secondary | ICD-10-CM | POA: Diagnosis not present

## 2019-09-20 DIAGNOSIS — Z299 Encounter for prophylactic measures, unspecified: Secondary | ICD-10-CM | POA: Diagnosis not present

## 2019-09-20 DIAGNOSIS — Z713 Dietary counseling and surveillance: Secondary | ICD-10-CM | POA: Diagnosis not present

## 2019-09-20 DIAGNOSIS — Z6833 Body mass index (BMI) 33.0-33.9, adult: Secondary | ICD-10-CM | POA: Diagnosis not present

## 2019-09-20 DIAGNOSIS — H6691 Otitis media, unspecified, right ear: Secondary | ICD-10-CM | POA: Diagnosis not present

## 2019-09-20 DIAGNOSIS — I10 Essential (primary) hypertension: Secondary | ICD-10-CM | POA: Diagnosis not present

## 2019-09-27 DIAGNOSIS — Z713 Dietary counseling and surveillance: Secondary | ICD-10-CM | POA: Diagnosis not present

## 2019-09-27 DIAGNOSIS — G8929 Other chronic pain: Secondary | ICD-10-CM | POA: Diagnosis not present

## 2019-09-27 DIAGNOSIS — Z6833 Body mass index (BMI) 33.0-33.9, adult: Secondary | ICD-10-CM | POA: Diagnosis not present

## 2019-09-27 DIAGNOSIS — I10 Essential (primary) hypertension: Secondary | ICD-10-CM | POA: Diagnosis not present

## 2019-09-27 DIAGNOSIS — Z299 Encounter for prophylactic measures, unspecified: Secondary | ICD-10-CM | POA: Diagnosis not present

## 2019-10-03 ENCOUNTER — Encounter: Payer: Medicare HMO | Admitting: Neurology

## 2019-10-04 DIAGNOSIS — I10 Essential (primary) hypertension: Secondary | ICD-10-CM | POA: Diagnosis not present

## 2019-10-04 DIAGNOSIS — Z299 Encounter for prophylactic measures, unspecified: Secondary | ICD-10-CM | POA: Diagnosis not present

## 2019-10-04 DIAGNOSIS — Z6833 Body mass index (BMI) 33.0-33.9, adult: Secondary | ICD-10-CM | POA: Diagnosis not present

## 2019-10-04 DIAGNOSIS — R252 Cramp and spasm: Secondary | ICD-10-CM | POA: Diagnosis not present

## 2019-10-04 DIAGNOSIS — Z713 Dietary counseling and surveillance: Secondary | ICD-10-CM | POA: Diagnosis not present

## 2019-10-09 DIAGNOSIS — R252 Cramp and spasm: Secondary | ICD-10-CM | POA: Diagnosis not present

## 2019-10-11 DIAGNOSIS — R252 Cramp and spasm: Secondary | ICD-10-CM | POA: Diagnosis not present

## 2019-10-11 DIAGNOSIS — Z0000 Encounter for general adult medical examination without abnormal findings: Secondary | ICD-10-CM | POA: Diagnosis not present

## 2019-10-11 DIAGNOSIS — Z7189 Other specified counseling: Secondary | ICD-10-CM | POA: Diagnosis not present

## 2019-10-11 DIAGNOSIS — Z6833 Body mass index (BMI) 33.0-33.9, adult: Secondary | ICD-10-CM | POA: Diagnosis not present

## 2019-10-11 DIAGNOSIS — R5383 Other fatigue: Secondary | ICD-10-CM | POA: Diagnosis not present

## 2019-10-11 DIAGNOSIS — Z299 Encounter for prophylactic measures, unspecified: Secondary | ICD-10-CM | POA: Diagnosis not present

## 2019-10-11 DIAGNOSIS — I10 Essential (primary) hypertension: Secondary | ICD-10-CM | POA: Diagnosis not present

## 2019-10-11 DIAGNOSIS — Z1331 Encounter for screening for depression: Secondary | ICD-10-CM | POA: Diagnosis not present

## 2019-10-17 ENCOUNTER — Telehealth: Payer: Self-pay | Admitting: Neurology

## 2019-10-17 NOTE — Telephone Encounter (Signed)
lvm to advise pt appt for 11/11 has been c/a due to tech being out, requested he call back to r/s

## 2019-10-18 ENCOUNTER — Encounter: Payer: Medicare HMO | Admitting: Neurology

## 2019-10-19 DIAGNOSIS — Z713 Dietary counseling and surveillance: Secondary | ICD-10-CM | POA: Diagnosis not present

## 2019-10-19 DIAGNOSIS — K649 Unspecified hemorrhoids: Secondary | ICD-10-CM | POA: Diagnosis not present

## 2019-10-19 DIAGNOSIS — M4810 Ankylosing hyperostosis [Forestier], site unspecified: Secondary | ICD-10-CM | POA: Diagnosis not present

## 2019-10-19 DIAGNOSIS — I10 Essential (primary) hypertension: Secondary | ICD-10-CM | POA: Diagnosis not present

## 2019-10-19 DIAGNOSIS — Z299 Encounter for prophylactic measures, unspecified: Secondary | ICD-10-CM | POA: Diagnosis not present

## 2019-11-03 DIAGNOSIS — I10 Essential (primary) hypertension: Secondary | ICD-10-CM | POA: Diagnosis not present

## 2019-11-03 DIAGNOSIS — Z299 Encounter for prophylactic measures, unspecified: Secondary | ICD-10-CM | POA: Diagnosis not present

## 2019-11-03 DIAGNOSIS — Z6833 Body mass index (BMI) 33.0-33.9, adult: Secondary | ICD-10-CM | POA: Diagnosis not present

## 2019-11-03 DIAGNOSIS — Z713 Dietary counseling and surveillance: Secondary | ICD-10-CM | POA: Diagnosis not present

## 2019-11-03 DIAGNOSIS — R252 Cramp and spasm: Secondary | ICD-10-CM | POA: Diagnosis not present

## 2019-11-16 DIAGNOSIS — I10 Essential (primary) hypertension: Secondary | ICD-10-CM | POA: Diagnosis not present

## 2019-11-16 DIAGNOSIS — Z6833 Body mass index (BMI) 33.0-33.9, adult: Secondary | ICD-10-CM | POA: Diagnosis not present

## 2019-11-16 DIAGNOSIS — G8929 Other chronic pain: Secondary | ICD-10-CM | POA: Diagnosis not present

## 2019-11-16 DIAGNOSIS — Z299 Encounter for prophylactic measures, unspecified: Secondary | ICD-10-CM | POA: Diagnosis not present

## 2019-11-16 DIAGNOSIS — G4700 Insomnia, unspecified: Secondary | ICD-10-CM | POA: Diagnosis not present

## 2019-11-18 DIAGNOSIS — M4810 Ankylosing hyperostosis [Forestier], site unspecified: Secondary | ICD-10-CM | POA: Diagnosis not present

## 2019-11-23 DIAGNOSIS — G8929 Other chronic pain: Secondary | ICD-10-CM | POA: Diagnosis not present

## 2019-11-23 DIAGNOSIS — Z299 Encounter for prophylactic measures, unspecified: Secondary | ICD-10-CM | POA: Diagnosis not present

## 2019-11-23 DIAGNOSIS — R300 Dysuria: Secondary | ICD-10-CM | POA: Diagnosis not present

## 2019-11-23 DIAGNOSIS — I10 Essential (primary) hypertension: Secondary | ICD-10-CM | POA: Diagnosis not present

## 2019-12-11 ENCOUNTER — Ambulatory Visit: Payer: Medicare HMO | Admitting: Neurology

## 2019-12-19 DIAGNOSIS — M4810 Ankylosing hyperostosis [Forestier], site unspecified: Secondary | ICD-10-CM | POA: Diagnosis not present

## 2019-12-21 DIAGNOSIS — G4700 Insomnia, unspecified: Secondary | ICD-10-CM | POA: Diagnosis not present

## 2019-12-21 DIAGNOSIS — I10 Essential (primary) hypertension: Secondary | ICD-10-CM | POA: Diagnosis not present

## 2019-12-21 DIAGNOSIS — G8929 Other chronic pain: Secondary | ICD-10-CM | POA: Diagnosis not present

## 2019-12-21 DIAGNOSIS — R109 Unspecified abdominal pain: Secondary | ICD-10-CM | POA: Diagnosis not present

## 2019-12-21 DIAGNOSIS — Z299 Encounter for prophylactic measures, unspecified: Secondary | ICD-10-CM | POA: Diagnosis not present

## 2019-12-21 DIAGNOSIS — Z6833 Body mass index (BMI) 33.0-33.9, adult: Secondary | ICD-10-CM | POA: Diagnosis not present

## 2019-12-22 DIAGNOSIS — R109 Unspecified abdominal pain: Secondary | ICD-10-CM | POA: Diagnosis not present

## 2020-01-16 ENCOUNTER — Telehealth: Payer: Self-pay | Admitting: Neurology

## 2020-01-16 NOTE — Telephone Encounter (Signed)
[email or web address] has called for pt's 3 month f/u.  Doxy.me vv link was sent to her and she received it for 02-11 was sent to [email or web address]

## 2020-01-18 ENCOUNTER — Telehealth (INDEPENDENT_AMBULATORY_CARE_PROVIDER_SITE_OTHER): Payer: Medicare HMO | Admitting: Neurology

## 2020-01-18 ENCOUNTER — Other Ambulatory Visit: Payer: Self-pay

## 2020-01-18 ENCOUNTER — Encounter: Payer: Self-pay | Admitting: Neurology

## 2020-01-18 ENCOUNTER — Telehealth: Payer: Self-pay | Admitting: Neurology

## 2020-01-18 DIAGNOSIS — R269 Unspecified abnormalities of gait and mobility: Secondary | ICD-10-CM

## 2020-01-18 DIAGNOSIS — G259 Extrapyramidal and movement disorder, unspecified: Secondary | ICD-10-CM

## 2020-01-18 NOTE — Progress Notes (Signed)
Virtual Visit via Video Note  I connected with Jayden A Tabares on 01/18/20 at  3:45 PM EST by a video enabled telemedicine application and verified that I am speaking with the correct person using two identifiers.  Location: Patient: at [facility] Provider: in the office    I discussed the limitations of evaluation and management by telemedicine and the availability of in person appointments. The patient expressed understanding and agreed to proceed.  History of Present Illness: 01/18/2020 SS: Mr. Swamy is a 69 year old male with history of chronic pain syndrome associated with DISH.  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  Since around July 2020 he has complained of jerking and twitching of the arms and legs. He was placed on Depakote, but had stomach upset and discontinued the medication.  MRI of the brain in June 2020 showed mild cortical atrophy, otherwise was normal.  Nerve conduction studies have been ordered.  MRI of the thoracic spine was unremarkable.  MRI of cervical spine was ordered, he says was completed, but I do not have the report from [place].  He continues to complain of involuntary jerks in the arms and legs, he has chronic tremors/twitching that seem to have been present to some degree prior to July 2020, but have worsened.  The jerks occur on a daily basis, his arms may be crossed, then will suddenly fly up.  He is able to feed himself.  He is not able to ambulate.  He is in a wheelchair, is able to stand and pivot.  He denies any bowel or bladder incontinence.  He is on a fentanyl patch for pain.  He presents today for evaluation via virtual visit.  08/29/2019 [name]: Mr. Jaber is a 69 year old right-handed white male with a history of chronic pain syndrome associated with DISH.  The patient has been on chronic opiate and benzodiazepine therapy.  On 19 Apr 2019, the patient apparently fell at home  and was unconscious on the floor for greater than 12 hours.  The patient was taken to the hospital, he was felt to have had a medication overdose, a urine drug screen however was never done.  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  The patient himself has very little recollection about what was going on around this time.  The patient went to an extended care facility but went back to [facility] for ongoing detoxification.  The family indicates that an MRI of the brain was done at that time, I do not have the report of this.  The patient has been at [facility], since being there in July 2020, the patient has had jerking and twitching of the arms and legs, he has not been able to effectively walk.  The patient recalls having initially numbness from the mid chest level down with difficulty controlling the bowels and the bladder.  He has reported some mild troubles with word finding and memory.  The patient has not had any difficulty with swallowing or choking.  He currently is able to control the bowels and bladder much better, the numbness in the chest has gotten better.  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  He gives a history of a vitamin B12 deficiency previously.  The patient has not  had any weakness of the arms or legs.  He reports no loss of vision but he does have some flashing lights in the periphery of the vision bilaterally.  He is sent to this office for further evaluation.   Observations/Objective: Via virtual visit, is sitting in wheelchair, is alert and oriented, speech is clear and concise, good historian, facial symmetry noted, head titubation was noted, moderate postural tremor in bilateral hands with arms outstretched, able to push off wheelchair to stand, leg shaky, had  to sit back down  Assessment and Plan: 1.  Movement disorder 2.  Episode of drug overdose, rhabdomyolysis, subsequent drug withdrawal 3.  Gait disorder 4.  Possible peripheral neuropathy  Thus far, MRI of the brain showed mild cortical atrophy, otherwise was normal.  MRI of the thoracic spine was unremarkable.  He says he had MRI of cervical spine done at that time, I will try to obtain a report from [place].  EMG nerve conduction has yet to be completed, I will get this set up with Dr. Jannifer Franklin.  The patient will have his POA bring him to the office.  He will need to discuss with his primary doctor, increasing his fentanyl patch dosing at this time.  The patient is still undergoing work-up for etiology of the unusual movement disorder that has developed.  Follow Up Instructions: Follow-up with Dr. Jannifer Franklin for nerve conduction, otherwise 3 month follow-up with Dr. Jannifer Franklin afterwards   I discussed the assessment and treatment plan with the patient. The patient was provided an opportunity to ask questions and all were answered. The patient agreed with the plan and demonstrated an understanding of the instructions.   The patient was advised to call back or seek an in-person evaluation if the symptoms worsen or if the condition fails to improve as anticipated.  I provided 25 minutes of non-face-to-face time during this encounter.   Evangeline Dakin, DNP  [facility] [address] [place], [place] [address] [phone]

## 2020-01-18 NOTE — Telephone Encounter (Signed)
Please get this patient set up for first available nerve conduction evaluation with [name].  He resides at an assisted living facility in [place].  His POA, Maggie Schwalbe will need to bring him.  His assisted living facility is called [facility].  Following his nerve conduction study, needs follow-up with [name] 3-4 months.

## 2020-01-21 NOTE — Progress Notes (Signed)
I have read the note, and I agree with the clinical assessment and plan.  [name] K Enda Santo   

## 2020-01-23 ENCOUNTER — Telehealth: Payer: Self-pay | Admitting: Neurology

## 2020-01-23 NOTE — Telephone Encounter (Signed)
I called patient's POA to schedule NCV/EMG & 3 month follow-up with [name] per NP & RN request. [name]) that patient will come back in March likely for next available NCV/EMG with [name], and then a 3 month follow-up from the date of NCV/EMG. [name] stated that she was not near her calendar & will call back when she is able to see her schedule. FYI

## 2020-01-23 NOTE — Telephone Encounter (Signed)
Sent to [employer] for EMG scheduling per Maralyn Sago NP note.

## 2020-01-23 NOTE — Telephone Encounter (Signed)
I received MRI of the cervical spine done 09/08/2019 at [facility]. I have asked that he be set up for EMG with [name]. Will give results to [name].  The report indicates the examination was somewhat limited by motion.  He has a congenitally narrow central canal, no disc bulge or protrusion is identified. Uncovertebral spurring at C6-7 causes mild to moderate bilateral foraminal narrowing. DISH.  Additionally, ankylosis of the C3-4 and C4-5 facets bilaterally and autologous fusion across the C6-7 disc interspace are seen.

## 2020-03-06 ENCOUNTER — Encounter: Payer: Medicare HMO | Admitting: Neurology

## 2020-05-13 ENCOUNTER — Ambulatory Visit (INDEPENDENT_AMBULATORY_CARE_PROVIDER_SITE_OTHER): Payer: Medicare HMO | Admitting: Neurology

## 2020-05-13 ENCOUNTER — Encounter: Payer: Self-pay | Admitting: Neurology

## 2020-05-13 ENCOUNTER — Ambulatory Visit: Payer: Medicare HMO | Admitting: Neurology

## 2020-05-13 ENCOUNTER — Other Ambulatory Visit: Payer: Self-pay

## 2020-05-13 DIAGNOSIS — G3281 Cerebellar ataxia in diseases classified elsewhere: Secondary | ICD-10-CM

## 2020-05-13 DIAGNOSIS — G603 Idiopathic progressive neuropathy: Secondary | ICD-10-CM

## 2020-05-13 DIAGNOSIS — E538 Deficiency of other specified B group vitamins: Secondary | ICD-10-CM

## 2020-05-13 DIAGNOSIS — G629 Polyneuropathy, unspecified: Secondary | ICD-10-CM

## 2020-05-13 HISTORY — DX: Polyneuropathy, unspecified: G62.9

## 2020-05-13 NOTE — Progress Notes (Signed)
Please refer to EMG and nerve conduction procedure note.  

## 2020-05-13 NOTE — Procedures (Signed)
     HISTORY:  [name] is a 69 year old gentleman with a history of problems with gait instability, tremors following an episode of medication overdose and coma.  The patient is being evaluated for a possible peripheral neuropathy as an adjunctive etiology of his gait problem.  NERVE CONDUCTION STUDIES:  Nerve conduction studies were performed on the left upper extremity.  The left ulnar nerve reveals a normal distal motor latency and motor amplitude with normal nerve conduction velocities.  The left radial sensory latency is normal as is the left ulnar sensory latency.  Left ulnar F-wave latency is normal.  Nerve conduction studies were performed on both lower extremities.  The distal motor latencies for the peroneal and posterior tibial nerves were normal bilaterally with low motor amplitudes seen for these nerves.  No response was seen on the left peroneal nerve at the fibular head.  The nerve conduction velocities for the right peroneal nerve and for the posterior tibial nerves bilaterally were slowed.  The sural and peroneal sensory latencies were unobtainable bilaterally.  The F-wave latencies for the posterior tibial nerves were within normal limits bilaterally.  EMG STUDIES:  EMG study was performed on the right lower extremity:  The tibialis anterior muscle reveals 2 to 4K motor units with full recruitment. No fibrillations or positive waves were seen. The peroneus tertius muscle reveals 2 to 4K motor units with full recruitment. No fibrillations or positive waves were seen. The medial gastrocnemius muscle reveals 1 to 3K motor units with full recruitment. No fibrillations or positive waves were seen. The vastus lateralis muscle reveals 2 to 4K motor units with full recruitment. No fibrillations or positive waves were seen. The iliopsoas muscle reveals 2 to 4K motor units with full recruitment. No fibrillations or positive waves were seen. The biceps femoris muscle (long head)  reveals 2 to 4K motor units with full recruitment. No fibrillations or positive waves were seen. The lumbosacral paraspinal muscles were tested at 3 levels, and revealed no abnormalities of insertional activity at all 3 levels tested. There was good relaxation.   IMPRESSION:  Nerve conduction studies done on both lower extremities and on the left upper extremity shows evidence of a primarily axonal peripheral neuropathy of moderate severity.  The patient however has severe peripheral edema which may affect the results of the nerve conduction studies.  EMG evaluation of the right lower extremity was unremarkable, no evidence of an overlying lumbosacral radiculopathy was seen.  Marlan Palau MD 05/13/2020 2:08 PM  [facility] [address] [place], [place] [address]  Phone [phone] Fax [phone]

## 2020-05-13 NOTE — Progress Notes (Signed)
The patient comes in for EMG nerve conduction study which shows evidence of a peripheral neuropathy, but the patient has severe peripheral edema which may impair the nerve conduction evaluation.  The patient has a relatively unremarkable EMG on the right leg.  The patient continues to have ataxic type tremors, possibly from anoxia.  The patient had MRI of the thoracic spine and MRI of the cervical spine, I do not have the MRI of the cervical spine report.  I will repeat the MRI of the brain, we will call [facility] see if we get the MRI cervical report.  Blood work will be done today.     MNC    Nerve / Sites Muscle Latency Ref. Amplitude Ref. Rel Amp Segments Distance Velocity Ref. Area    ms ms mV mV %  cm m/s m/s mVms  L Ulnar - ADM     Wrist ADM 2.4 ?3.3 7.8 ?6.0 100 Wrist - ADM 7   14.3     B.Elbow ADM 6.2  6.5  83.7 B.Elbow - Wrist 20 52 ?49 12.5     A.Elbow ADM 8.2  6.1  94 A.Elbow - B.Elbow 10 52 ?49 11.9         A.Elbow - Wrist      R Peroneal - EDB     Ankle EDB 4.1 ?6.5 1.7 ?2.0 100 Ankle - EDB 9   5.3     Fib head EDB 11.9  1.1  62.3 Fib head - Ankle 30 39 ?44 3.0     Pop fossa EDB 14.4  0.6  58.9 Pop fossa - Fib head 10 39 ?44 1.6         Pop fossa - Ankle      L Peroneal - EDB     Ankle EDB 4.7 ?6.5 0.8 ?2.0 100 Ankle - EDB 9   2.8     Fib head EDB NR  NR  NR Fib head - Ankle 27 NR ?44 NR     Pop fossa EDB 14.3  0.7   Pop fossa - Fib head 10 NR ?44 2.2         Pop fossa - Ankle      R Tibial - AH     Ankle AH 4.5 ?5.8 0.6 ?4.0 100 Ankle - AH 9   2.1     Pop fossa AH 15.4  0.2  35.6 Pop fossa - Ankle 36 33 ?41 1.3  L Tibial - AH     Ankle AH 4.6 ?5.8 1.8 ?4.0 100 Ankle - AH 9   4.6     Pop fossa AH 14.6  0.1  6.84 Pop fossa - Ankle 36 36 ?41 0.6                SNC    Nerve / Sites Rec. Site Peak Lat Ref.  Amp Ref. Segments Distance    ms ms V V  cm  L Radial - Anatomical snuff box (Forearm)     Forearm Wrist 2.3 ?2.9 21 ?15 Forearm - Wrist 10  R Sural  - Ankle (Calf)     Calf Ankle NR ?4.4 NR ?6 Calf - Ankle 14  L Sural - Ankle (Calf)     Calf Ankle NR ?4.4 NR ?6 Calf - Ankle 14  R Superficial peroneal - Ankle     Lat leg Ankle NR ?4.4 NR ?6 Lat leg - Ankle 14  L Superficial peroneal - Ankle  Lat leg Ankle NR ?4.4 NR ?6 Lat leg - Ankle 14  L Ulnar - Orthodromic, (Dig V, Mid palm)     Dig V Wrist 2.6 ?3.1 2 ?5 Dig V - Wrist 15                 F  Wave    Nerve F Lat Ref.   ms ms  R Tibial - AH 54.8 ?56.0  L Tibial - AH 52.9 ?56.0  L Ulnar - ADM 27.2 ?32.0

## 2020-05-15 ENCOUNTER — Telehealth: Payer: Self-pay | Admitting: Neurology

## 2020-05-15 NOTE — Telephone Encounter (Signed)
I finally got the report of the cervical spine from [facility] health care.  This study does not explain the deficits reported by the patient.  Cervical spine MRI 09/08/19:  Impression:  The examination is somewhat limited by motion.  The patient has a congenitally narrow central canal but no disc bulge or protrusion is identified.  Uncovertebral spurring at the C6-7 level causes mild to moderate bilateral foraminal narrowing. DISH.  Additionally, ankylosis of the C3-4 and C4-5 facets bilaterally and autologous fusion across the C6-7 disc interspace are seen.

## 2020-05-16 LAB — MULTIPLE MYELOMA PANEL, SERUM
Albumin SerPl Elph-Mcnc: 3.6 g/dL (ref 2.9–4.4)
Albumin/Glob SerPl: 1.2 (ref 0.7–1.7)
Alpha 1: 0.3 g/dL (ref 0.0–0.4)
Alpha2 Glob SerPl Elph-Mcnc: 0.8 g/dL (ref 0.4–1.0)
B-Globulin SerPl Elph-Mcnc: 1.3 g/dL (ref 0.7–1.3)
Gamma Glob SerPl Elph-Mcnc: 0.7 g/dL (ref 0.4–1.8)
Globulin, Total: 3.1 g/dL (ref 2.2–3.9)
IgA/Immunoglobulin A, Serum: 381 mg/dL (ref 61–437)
IgG (Immunoglobin G), Serum: 659 mg/dL (ref 603–1613)
IgM (Immunoglobulin M), Srm: 195 mg/dL — ABNORMAL HIGH (ref 20–172)
Total Protein: 6.7 g/dL (ref 6.0–8.5)

## 2020-05-16 LAB — SEDIMENTATION RATE: Sed Rate: 52 mm/hr — ABNORMAL HIGH (ref 0–30)

## 2020-05-16 LAB — ANGIOTENSIN CONVERTING ENZYME: Angio Convert Enzyme: 9 U/L — ABNORMAL LOW (ref 14–82)

## 2020-05-16 LAB — ENA+DNA/DS+SJORGEN'S
ENA RNP Ab: 1.2 AI — ABNORMAL HIGH (ref 0.0–0.9)
ENA SM Ab Ser-aCnc: <0.2 AI (ref 0.0–0.9)
ENA SSA (RO) Ab: <0.2 AI (ref 0.0–0.9)
ENA SSB (LA) Ab: <0.2 AI (ref 0.0–0.9)
dsDNA Ab: <1 IU/mL (ref 0–9)

## 2020-05-16 LAB — B. BURGDORFI ANTIBODIES: Lyme IgG/IgM Ab: <0.91 {ISR} (ref 0.00–0.90)

## 2020-05-16 LAB — VITAMIN B12: Vitamin B-12: 318 pg/mL (ref 232–1245)

## 2020-05-16 LAB — ANA W/REFLEX: Anti Nuclear Antibody (ANA): POSITIVE — AB

## 2020-06-03 ENCOUNTER — Other Ambulatory Visit: Payer: Self-pay | Admitting: Neurology

## 2020-06-03 DIAGNOSIS — G1229 Other motor neuron disease: Secondary | ICD-10-CM

## 2020-06-03 DIAGNOSIS — R270 Ataxia, unspecified: Secondary | ICD-10-CM

## 2020-06-03 DIAGNOSIS — R269 Unspecified abnormalities of gait and mobility: Secondary | ICD-10-CM

## 2020-06-03 NOTE — Telephone Encounter (Signed)
Patient POA [facility] Fus called stating that an MRI is suppose to be ordered.

## 2020-06-03 NOTE — Telephone Encounter (Signed)
I have ordered MRI of the brain thanks

## 2020-06-05 NOTE — Telephone Encounter (Signed)
[employer] medicare Berkley Harvey: [id number] (exp. 06/05/20 to 12/02/20) order faxed to [facility] they will reach out to the patient to schedule phone number [phone] & fax # [phone].

## 2020-06-20 IMAGING — DX LUMBAR SPINE - COMPLETE 4+ VIEW
5 series · 5 of 5 positions shown · non-contrast
Comparison: 10/07/2009

CLINICAL DATA: Severe low back pain. Fell 19 days ago. History of
back surgery.

EXAM:
LUMBAR SPINE - COMPLETE 4+ VIEW

[l-spine ap]
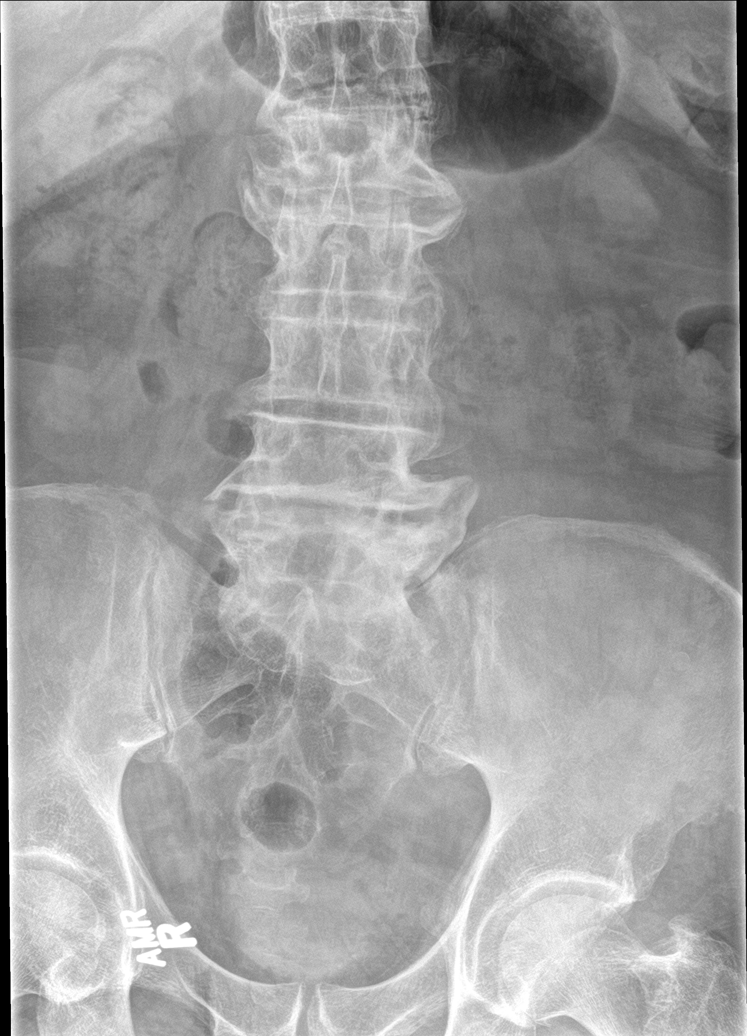

[l-spine obl (1 of 2)]
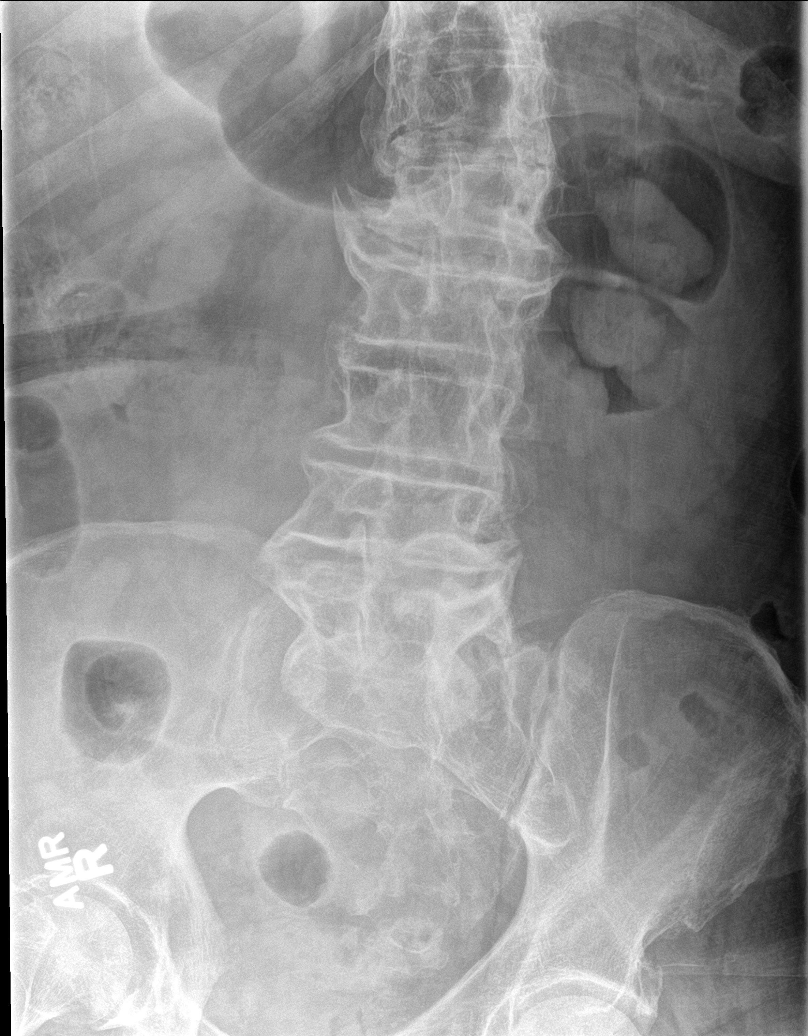

[l-spine obl (2 of 2)]
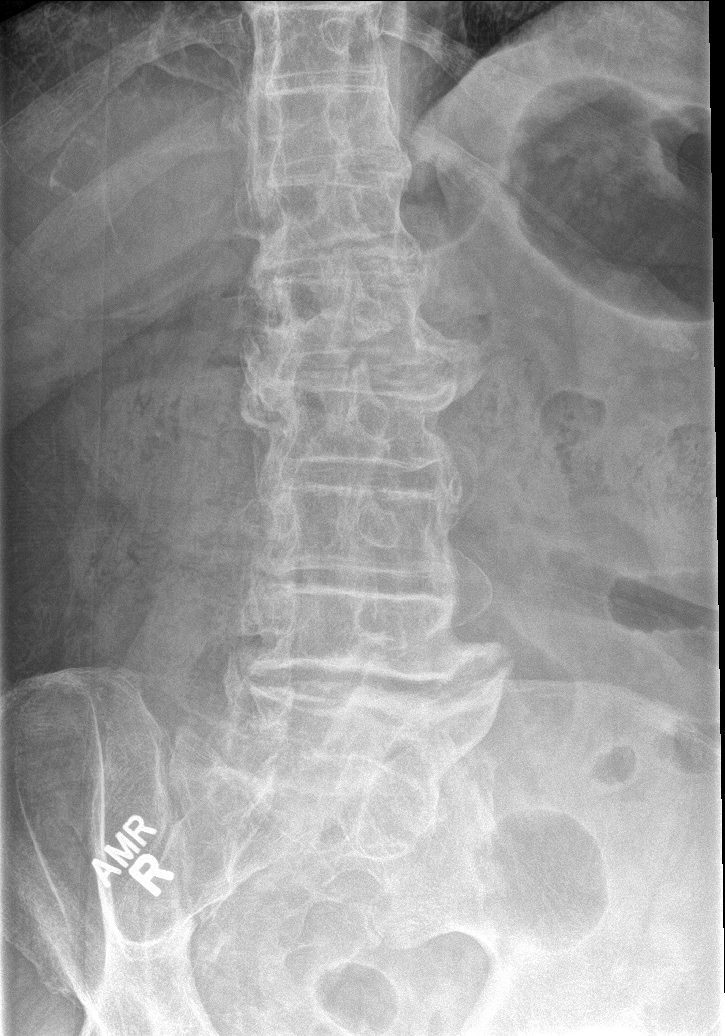

[l-spine lat]
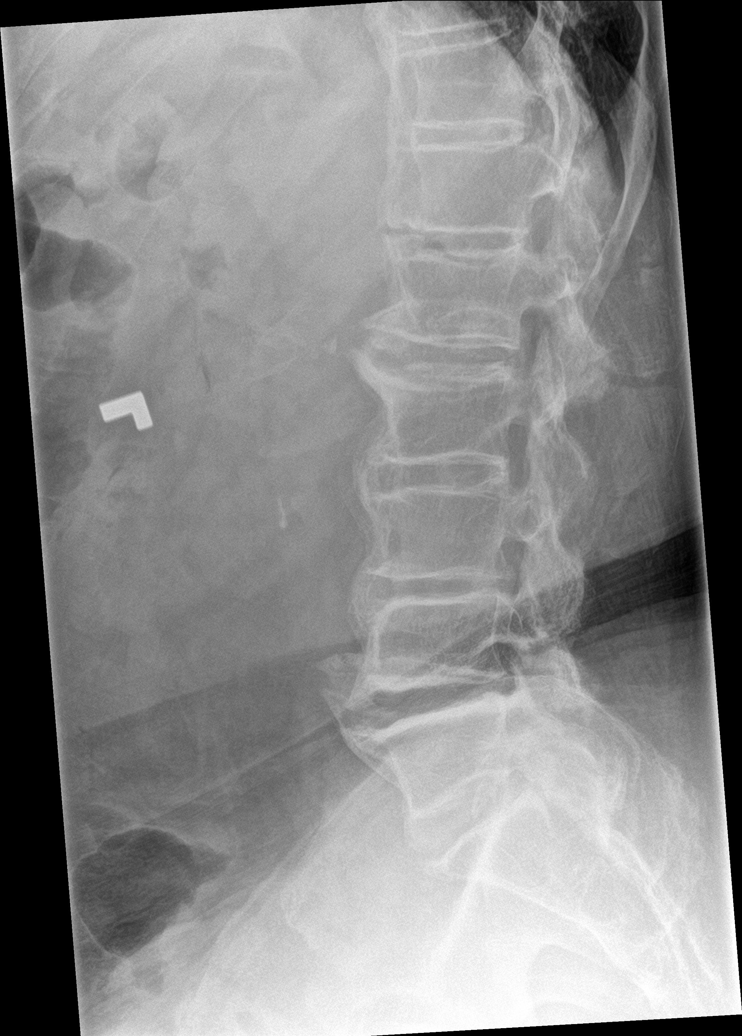

[l-spine spot]
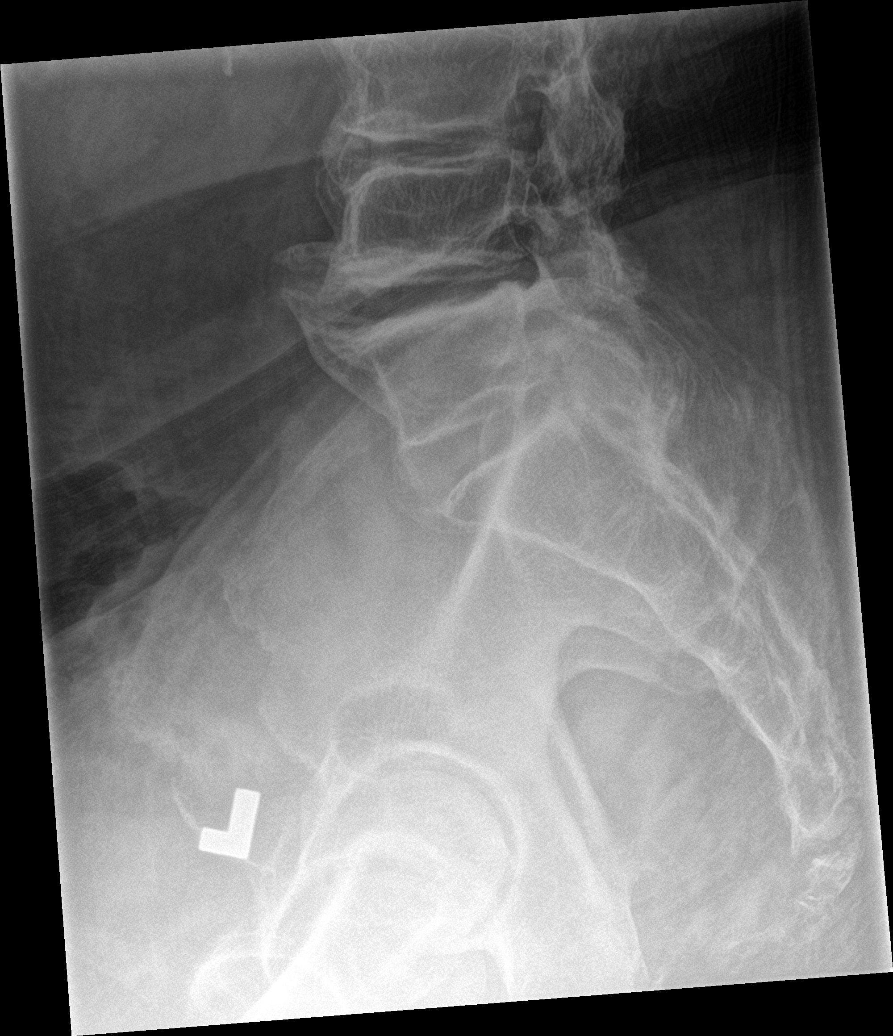

[5 of 5 positions shown; findings below may reference images not displayed]

FINDINGS: The lowest non rib-bearing vertebra is designated L5. Ribs at T12
are either hypoplastic or absent. There is straightening of the
normal lumbar lordosis without significant listhesis. Flowing
anterior vertebral ossification is again seen throughout the lumbar
and lower thoracic spine. There is new transverse lucency through an
anterior vertebral osteophyte at T12-L1 with lucency extending into
the L1 superior endplate. Vertebral body heights are preserved in
the lumbar spine. There is mild disc space narrowing at L3-4 and
L4-5.
IMPRESSION: Diffuse idiopathic skeletal hyperostosis with concern for a
potentially acute fracture through an anterior osteophyte at T12-L1
with potential involvement of the L1 vertebral body. Lumbar spine CT
is recommended for further evaluation.

These results will be called to the ordering clinician or
representative by the Radiologist Assistant, and communication
documented in the PACS or zVision Dashboard.

## 2020-06-30 ENCOUNTER — Other Ambulatory Visit: Payer: Self-pay

## 2020-06-30 ENCOUNTER — Inpatient Hospital Stay (HOSPITAL_COMMUNITY)
Admission: EM | Admit: 2020-06-30 | Discharge: 2020-07-03 | DRG: 445 | Disposition: A | Discharge status: Nursing Home (Includes ICF) | Payer: Medicare HMO | Source: Skilled Nursing Facility | Attending: Family Medicine | Admitting: Family Medicine

## 2020-06-30 ENCOUNTER — Encounter (HOSPITAL_COMMUNITY): Payer: Self-pay | Admitting: Emergency Medicine

## 2020-06-30 ENCOUNTER — Emergency Department (HOSPITAL_COMMUNITY): Payer: Medicare HMO

## 2020-06-30 DIAGNOSIS — R1013 Epigastric pain: Secondary | ICD-10-CM | POA: Diagnosis not present

## 2020-06-30 DIAGNOSIS — H9190 Unspecified hearing loss, unspecified ear: Secondary | ICD-10-CM | POA: Diagnosis present

## 2020-06-30 DIAGNOSIS — K219 Gastro-esophageal reflux disease without esophagitis: Secondary | ICD-10-CM | POA: Diagnosis present

## 2020-06-30 DIAGNOSIS — I10 Essential (primary) hypertension: Secondary | ICD-10-CM | POA: Diagnosis present

## 2020-06-30 DIAGNOSIS — G894 Chronic pain syndrome: Secondary | ICD-10-CM

## 2020-06-30 DIAGNOSIS — G4700 Insomnia, unspecified: Secondary | ICD-10-CM | POA: Diagnosis present

## 2020-06-30 DIAGNOSIS — G629 Polyneuropathy, unspecified: Secondary | ICD-10-CM | POA: Diagnosis present

## 2020-06-30 DIAGNOSIS — Z9842 Cataract extraction status, left eye: Secondary | ICD-10-CM

## 2020-06-30 DIAGNOSIS — Z8673 Personal history of transient ischemic attack (TIA), and cerebral infarction without residual deficits: Secondary | ICD-10-CM

## 2020-06-30 DIAGNOSIS — D72829 Elevated white blood cell count, unspecified: Secondary | ICD-10-CM | POA: Diagnosis present

## 2020-06-30 DIAGNOSIS — K8020 Calculus of gallbladder without cholecystitis without obstruction: Secondary | ICD-10-CM | POA: Diagnosis not present

## 2020-06-30 DIAGNOSIS — R52 Pain, unspecified: Secondary | ICD-10-CM

## 2020-06-30 DIAGNOSIS — G8929 Other chronic pain: Secondary | ICD-10-CM | POA: Diagnosis present

## 2020-06-30 DIAGNOSIS — F329 Major depressive disorder, single episode, unspecified: Secondary | ICD-10-CM | POA: Diagnosis present

## 2020-06-30 DIAGNOSIS — Z79899 Other long term (current) drug therapy: Secondary | ICD-10-CM

## 2020-06-30 DIAGNOSIS — Z20822 Contact with and (suspected) exposure to covid-19: Secondary | ICD-10-CM | POA: Diagnosis present

## 2020-06-30 DIAGNOSIS — K8070 Calculus of gallbladder and bile duct without cholecystitis without obstruction: Secondary | ICD-10-CM | POA: Diagnosis not present

## 2020-06-30 DIAGNOSIS — R1110 Vomiting, unspecified: Secondary | ICD-10-CM

## 2020-06-30 DIAGNOSIS — Z961 Presence of intraocular lens: Secondary | ICD-10-CM | POA: Diagnosis present

## 2020-06-30 DIAGNOSIS — Z888 Allergy status to other drugs, medicaments and biological substances status: Secondary | ICD-10-CM

## 2020-06-30 DIAGNOSIS — Z6841 Body Mass Index (BMI) 40.0 and over, adult: Secondary | ICD-10-CM

## 2020-06-30 DIAGNOSIS — R112 Nausea with vomiting, unspecified: Secondary | ICD-10-CM

## 2020-06-30 DIAGNOSIS — M4810 Ankylosing hyperostosis [Forestier], site unspecified: Secondary | ICD-10-CM | POA: Diagnosis present

## 2020-06-30 DIAGNOSIS — F17220 Nicotine dependence, chewing tobacco, uncomplicated: Secondary | ICD-10-CM | POA: Diagnosis present

## 2020-06-30 DIAGNOSIS — K2970 Gastritis, unspecified, without bleeding: Secondary | ICD-10-CM | POA: Diagnosis present

## 2020-06-30 DIAGNOSIS — Z9841 Cataract extraction status, right eye: Secondary | ICD-10-CM

## 2020-06-30 HISTORY — DX: Cerebral infarction, unspecified: I63.9

## 2020-06-30 HISTORY — DX: Unspecified osteoarthritis, unspecified site: M19.90

## 2020-06-30 HISTORY — DX: Essential (primary) hypertension: I10

## 2020-06-30 LAB — URINALYSIS, ROUTINE W REFLEX MICROSCOPIC
Bacteria, UA: NONE SEEN
Bilirubin Urine: NEGATIVE
Glucose, UA: NEGATIVE mg/dL
Ketones, ur: NEGATIVE mg/dL
Leukocytes,Ua: NEGATIVE
Nitrite: NEGATIVE
Protein, ur: NEGATIVE mg/dL
Specific Gravity, Urine: 1.032 — ABNORMAL HIGH (ref 1.005–1.030)
pH: 7 (ref 5.0–8.0)

## 2020-06-30 LAB — COMPREHENSIVE METABOLIC PANEL
ALT: 35 U/L (ref 0–44)
AST: 29 U/L (ref 15–41)
Albumin: 4 g/dL (ref 3.5–5.0)
Alkaline Phosphatase: 61 U/L (ref 38–126)
Anion gap: 12 (ref 5–15)
BUN: 19 mg/dL (ref 8–23)
CO2: 27 mmol/L (ref 22–32)
Calcium: 8.7 mg/dL — ABNORMAL LOW (ref 8.9–10.3)
Chloride: 95 mmol/L — ABNORMAL LOW (ref 98–111)
Creatinine, Ser: 1.07 mg/dL (ref 0.61–1.24)
GFR calc Af Amer: >60 mL/min (ref >60)
GFR calc non Af Amer: >60 mL/min (ref >60)
Glucose, Bld: 119 mg/dL — ABNORMAL HIGH (ref 70–99)
Potassium: 3.6 mmol/L (ref 3.5–5.1)
Sodium: 134 mmol/L — ABNORMAL LOW (ref 135–145)
Total Bilirubin: 0.5 mg/dL (ref 0.3–1.2)
Total Protein: 7.6 g/dL (ref 6.5–8.1)

## 2020-06-30 LAB — CBC WITH DIFFERENTIAL/PLATELET
Abs Immature Granulocytes: 0.04 10*3/uL (ref 0.00–0.07)
Basophils Absolute: 0 10*3/uL (ref 0.0–0.1)
Basophils Relative: 0 %
Eosinophils Absolute: 0.1 10*3/uL (ref 0.0–0.5)
Eosinophils Relative: 1 %
HCT: 42 % (ref 39.0–52.0)
Hemoglobin: 13.4 g/dL (ref 13.0–17.0)
Immature Granulocytes: 0 %
Lymphocytes Relative: 14 %
Lymphs Abs: 1.7 10*3/uL (ref 0.7–4.0)
MCH: 27 pg (ref 26.0–34.0)
MCHC: 31.9 g/dL (ref 30.0–36.0)
MCV: 84.5 fL (ref 80.0–100.0)
Monocytes Absolute: 0.8 10*3/uL (ref 0.1–1.0)
Monocytes Relative: 7 %
Neutro Abs: 9.9 10*3/uL — ABNORMAL HIGH (ref 1.7–7.7)
Neutrophils Relative %: 78 %
Platelets: ADEQUATE 10*3/uL (ref 150–400)
RBC: 4.97 MIL/uL (ref 4.22–5.81)
RDW: 14.3 % (ref 11.5–15.5)
WBC: 12.6 10*3/uL — ABNORMAL HIGH (ref 4.0–10.5)
nRBC: 0 % (ref 0.0–0.2)

## 2020-06-30 LAB — LIPASE, BLOOD: Lipase: 30 U/L (ref 11–51)

## 2020-06-30 LAB — SARS CORONAVIRUS 2 BY RT PCR (HOSPITAL ORDER, PERFORMED IN ~~LOC~~ HOSPITAL LAB): SARS Coronavirus 2: NEGATIVE

## 2020-06-30 MED ORDER — ONDANSETRON HCL 4 MG/2ML IJ SOLN: 4.0000 mg | Freq: Four times a day (QID) | INTRAMUSCULAR | Status: DC | PRN

## 2020-06-30 MED ORDER — HYDRALAZINE HCL 20 MG/ML IJ SOLN: 10.0000 mg | INTRAMUSCULAR | Status: DC | PRN

## 2020-06-30 MED ORDER — ALBUTEROL SULFATE (2.5 MG/3ML) 0.083% IN NEBU: 2.5000 mg | INHALATION_SOLUTION | RESPIRATORY_TRACT | Status: DC | PRN

## 2020-06-30 MED ORDER — PROMETHAZINE HCL 25 MG/ML IJ SOLN
12.5000 mg | Freq: Once | INTRAMUSCULAR | Status: AC
  Administered 2020-06-30: 12.5 mg via INTRAVENOUS

## 2020-06-30 MED ORDER — SODIUM CHLORIDE 0.9 % IV SOLN: INTRAVENOUS | Status: DC

## 2020-06-30 MED ORDER — METOCLOPRAMIDE HCL 5 MG/ML IJ SOLN
10.0000 mg | Freq: Once | INTRAMUSCULAR | Status: AC
  Administered 2020-06-30: 10 mg via INTRAVENOUS
  Filled 2020-06-30: qty 2

## 2020-06-30 MED ORDER — PANTOPRAZOLE SODIUM 40 MG PO TBEC
40.0000 mg | DELAYED_RELEASE_TABLET | Freq: Two times a day (BID) | ORAL | Status: DC
  Administered 2020-07-01 – 2020-07-03 (×6): 40 mg via ORAL
  Filled 2020-06-30 (×7): qty 1

## 2020-06-30 MED ORDER — ENOXAPARIN SODIUM 60 MG/0.6ML ~~LOC~~ SOLN
60.0000 mg | SUBCUTANEOUS | Status: DC
  Administered 2020-07-01 – 2020-07-03 (×3): 60 mg via SUBCUTANEOUS
  Filled 2020-06-30 (×3): qty 0.6

## 2020-06-30 MED ORDER — TRAZODONE HCL 50 MG PO TABS
25.0000 mg | ORAL_TABLET | Freq: Every evening | ORAL | Status: DC | PRN
  Administered 2020-06-30: 25 mg via ORAL
  Filled 2020-06-30 (×2): qty 1

## 2020-06-30 MED ORDER — OXYCODONE HCL 5 MG PO TABS
5.0000 mg | ORAL_TABLET | ORAL | Status: DC | PRN
  Administered 2020-06-30 – 2020-07-02 (×5): 5 mg via ORAL
  Filled 2020-06-30 (×5): qty 1

## 2020-06-30 MED ORDER — ONDANSETRON HCL 4 MG PO TABS
4.0000 mg | ORAL_TABLET | Freq: Four times a day (QID) | ORAL | Status: DC | PRN
  Administered 2020-07-01: 4 mg via ORAL
  Filled 2020-06-30: qty 1

## 2020-06-30 MED ORDER — IOHEXOL 300 MG/ML  SOLN
100.0000 mL | Freq: Once | INTRAMUSCULAR | Status: AC | PRN
  Administered 2020-06-30: 100 mL via INTRAVENOUS

## 2020-06-30 MED ORDER — TEMAZEPAM 15 MG PO CAPS
15.0000 mg | ORAL_CAPSULE | Freq: Every evening | ORAL | Status: DC | PRN
  Administered 2020-06-30 – 2020-07-02 (×3): 15 mg via ORAL
  Filled 2020-06-30 (×3): qty 1

## 2020-06-30 MED ORDER — MORPHINE SULFATE (PF) 4 MG/ML IV SOLN
4.0000 mg | Freq: Once | INTRAVENOUS | Status: AC
  Administered 2020-06-30: 4 mg via INTRAVENOUS
  Filled 2020-06-30: qty 1

## 2020-06-30 MED ORDER — ACETAMINOPHEN 325 MG PO TABS
650.0000 mg | ORAL_TABLET | Freq: Four times a day (QID) | ORAL | Status: DC | PRN
  Administered 2020-07-01: 650 mg via ORAL
  Filled 2020-06-30: qty 2

## 2020-06-30 MED ORDER — DULOXETINE HCL 30 MG PO CPEP
30.0000 mg | ORAL_CAPSULE | Freq: Two times a day (BID) | ORAL | Status: DC
  Administered 2020-06-30 – 2020-07-03 (×6): 30 mg via ORAL
  Filled 2020-06-30 (×6): qty 1

## 2020-06-30 MED ORDER — PANTOPRAZOLE SODIUM 40 MG PO TBEC: 40.0000 mg | DELAYED_RELEASE_TABLET | Freq: Every day | ORAL | Status: DC

## 2020-06-30 MED ORDER — LORATADINE 10 MG PO TABS
10.0000 mg | ORAL_TABLET | Freq: Every day | ORAL | Status: DC
  Administered 2020-07-01 – 2020-07-03 (×3): 10 mg via ORAL
  Filled 2020-06-30 (×3): qty 1

## 2020-06-30 MED ORDER — ACETAMINOPHEN 650 MG RE SUPP: 650.0000 mg | Freq: Four times a day (QID) | RECTAL | Status: DC | PRN

## 2020-06-30 MED ORDER — DIPHENHYDRAMINE HCL 50 MG/ML IJ SOLN
12.5000 mg | Freq: Once | INTRAMUSCULAR | Status: AC
  Administered 2020-06-30: 12.5 mg via INTRAVENOUS
  Filled 2020-06-30: qty 1

## 2020-06-30 MED ORDER — GABAPENTIN 300 MG PO CAPS
300.0000 mg | ORAL_CAPSULE | Freq: Two times a day (BID) | ORAL | Status: DC
  Filled 2020-06-30: qty 1

## 2020-06-30 MED ORDER — LISINOPRIL 10 MG PO TABS
10.0000 mg | ORAL_TABLET | Freq: Every day | ORAL | Status: DC
  Administered 2020-07-01 – 2020-07-03 (×3): 10 mg via ORAL
  Filled 2020-06-30 (×3): qty 1

## 2020-06-30 MED ORDER — MORPHINE SULFATE (PF) 2 MG/ML IV SOLN
2.0000 mg | INTRAVENOUS | Status: DC | PRN
  Administered 2020-07-02 (×3): 2 mg via INTRAVENOUS
  Filled 2020-06-30 (×3): qty 1

## 2020-06-30 NOTE — H&P (Signed)
History and Physical    DELOS KLICH RKY:[id number] DOB: [birth year] DOA: 06/30/2020  PCP: [name], MD  Patient coming from: Home  I have personally briefly reviewed patient's old medical records in [facility]  Chief Complaint: Abdominal pain  HPI: [name] is a 69 y.o. male with medical history significant of GERD, depression, morbid obesity, chronic pain, diffuse idiopathic skeletal hyperostosis who presents to the ED for evaluation of abdominal pain.  Patient reports abdominal pain every day for a week.  Epigastric pain occurred today.  The patient was previously at [facility] ED earlier today and was given nausea medications which initially helped and the patient was discharged from the emergency department.  However symptoms are back and the patient presented to [name] ED  Patient does have a history of gallstones for which he was diagnosed several years ago via ultrasound however at that time they were not bothersome.  On presentation today ultrasound could not be performed due to lack of ability ability however abdominal CT was performed that did demonstrate cholelithiasis without radiographic evidence of acute cholecystitis.  Lab work was significant for mild leukocytosis of 12.6 with neutrophilic predominance.  Remainder of laboratory work-up overall unrevealing.  The patient did endorse severe abdominal pain was given pain control and antiemetics with good result.  On my evaluation patient is resting comfortably.  He is in no visible distress.  ER physician spoke with covering surgery Dr. Tonna Boehringer who requested medicine admission for completion of abdominal ultrasound and general surgery consult in the morning.   ED Course: Initial vital signs are reassuring.  Patient was given intravenous morphine and Reglan for pain and nausea control.  Abdominal CT completed, results as above.  Hospitalist contacted for admission  Review of Systems: As per HPI otherwise 14 point  review of systems negative.    Past Medical History:  Diagnosis Date  . Arthritis   . Cataract   . Chronic pain   . DISH (diffuse idiopathic skeletal hyperostosis)   . Gait abnormality 08/29/2019  . GERD (gastroesophageal reflux disease)   . Hypertension   . Impaired hearing   . Peripheral neuropathy 05/13/2020  . Stroke [facility])     Past Surgical History:  Procedure Laterality Date  . BACK SURGERY     X2  . CATARACT EXTRACTION W/PHACO  12/03/2011   Procedure: CATARACT EXTRACTION PHACO AND INTRAOCULAR LENS PLACEMENT (IOC);  Surgeon: Gemma Payor;  Location: [facility];  Service: Ophthalmology;  Laterality: Right;  CDE: 7.86  . CATARACT EXTRACTION W/PHACO  12/21/2011   Procedure: CATARACT EXTRACTION PHACO AND INTRAOCULAR LENS PLACEMENT (IOC);  Surgeon: Gemma Payor, MD;  Location: [facility]S;  Service: Ophthalmology;  Laterality: Left;  CDE=5.51  . KNEE ARTHROSCOPY W/ ACL RECONSTRUCTION  05/2000   right, GSBO  . KNEE SURGERY  10/2000   remove screw after ACL  . LAMINECTOMY    . TUMOR EXCISION     back, cyst     reports that he has never smoked. His smokeless tobacco use includes chew. He reports that he does not drink alcohol and does not use drugs.  Allergies  Allergen Reactions  . Ceclor [Cefaclor] Other (See Comments)    Keeps patient wake for days.   . Depakote [Divalproex Sodium]     Stomach upset  . Nsaids Other (See Comments)    G.I. Upset  . Other Other (See Comments)    Steroids make patient extremely agitated     Family History  Problem  Relation Age of Onset  . Anesthesia problems Neg Hx   . Hypotension Neg Hx   . Malignant hyperthermia Neg Hx   . Pseudochol deficiency Neg Hx     Prior to Admission medications   Medication Sig Start Date End Date Taking? Authorizing Provider  carbamide peroxide (DEBROX) 6.5 % OTIC solution 5 drops 2 (two) times daily.    [provider]  cetirizine (ZYRTEC) 10 MG tablet Take 10 mg by mouth daily.    [provider]    DULoxetine (CYMBALTA) 60 MG capsule Take 30 mg by mouth 2 (two) times daily.     [provider]  fentaNYL (DURAGESIC) 12 MCG/HR  08/25/19   [provider]  gabapentin (NEURONTIN) 300 MG capsule 300 mg 2 (two) times daily.  08/24/19   [provider]  HYDROcodone-acetaminophen (NORCO/VICODIN) 5-325 MG tablet  08/25/19   [provider]  ketoconazole (NIZORAL) 2 % shampoo Apply 1 application topically daily as needed for irritation.  12/16/18   [provider]  lansoprazole (PREVACID) 30 MG capsule Take 30 mg by mouth daily.     [provider]  lisinopril (ZESTRIL) 5 MG tablet Take 10 mg by mouth daily.  04/17/19   [provider]  sildenafil (VIAGRA) 100 MG tablet Take 1 tablet by mouth daily as needed for erectile dysfunction.  01/17/19   [provider]  temazepam (RESTORIL) 15 MG capsule Take 1 capsule (15 mg total) by mouth at bedtime as needed for sleep. 04/26/19   Catarina Hartshorn, MD    Physical Exam: Vitals:   06/30/20 2236 06/30/20 2240 06/30/20 2241 06/30/20 2242  BP:   112/76   Pulse: 105 (!) 108 (!) 106 (!) 108  Resp:      Temp:      TempSrc:      SpO2: 95% 94% 94% 97%  Weight:      Height:         Vitals:   06/30/20 2236 06/30/20 2240 06/30/20 2241 06/30/20 2242  BP:   112/76   Pulse: 105 (!) 108 (!) 106 (!) 108  Resp:      Temp:      TempSrc:      SpO2: 95% 94% 94% 97%  Weight:      Height:       Constitutional: NAD, calm, comfortable Eyes: PERRL, lids and conjunctivae normal ENMT: Mucous membranes are moist. Posterior pharynx clear of any exudate or lesions.Normal dentition.  Neck: normal, supple, no masses, no thyromegaly Respiratory: clear to auscultation bilaterally, no wheezing, no crackles. Normal respiratory effort. No accessory muscle use.  Cardiovascular: Regular rate and rhythm, no murmurs / rubs / gallops. No extremity edema. 2+ pedal pulses. No carotid bruits.  Abdomen: Obese, tender to  palpation right upper quadrant and epigastrium Musculoskeletal: no clubbing / cyanosis. No joint deformity upper and lower extremities. Good ROM, no contractures. Normal muscle tone.  Skin: no rashes, lesions, ulcers. No induration Neurologic: CN 2-12 grossly intact. Sensation intact, DTR normal. Strength 5/5 in all 4.  Psychiatric: Normal judgment and insight. Alert and oriented x 3. Normal mood.    Labs on Admission: I have personally reviewed following labs and imaging studies  CBC: Recent Labs  Lab 06/30/20 2006  WBC 12.6*  NEUTROABS 9.9*  HGB 13.4  HCT 42.0  MCV 84.5  PLT PLATELET CLUMPS NOTED ON SMEAR, COUNT APPEARS ADEQUATE   Basic Metabolic Panel: Recent Labs  Lab 06/30/20 2006  NA 134*  K 3.6  CL 95*  CO2 27  GLUCOSE 119*  BUN 19  CREATININE 1.07  CALCIUM 8.7*   GFR: Estimated Creatinine Clearance: 87.2 mL/min (by C-G formula based on SCr of 1.07 mg/dL). Liver Function Tests: Recent Labs  Lab 06/30/20 2006  AST 29  ALT 35  ALKPHOS 61  BILITOT 0.5  PROT 7.6  ALBUMIN 4.0   Recent Labs  Lab 06/30/20 2006  LIPASE 30   No results for input(s): AMMONIA in the last 168 hours. Coagulation Profile: No results for input(s): INR, PROTIME in the last 168 hours. Cardiac Enzymes: No results for input(s): CKTOTAL, CKMB, CKMBINDEX, TROPONINI in the last 168 hours. BNP (last 3 results) No results for input(s): PROBNP in the last 8760 hours. HbA1C: No results for input(s): HGBA1C in the last 72 hours. CBG: No results for input(s): GLUCAP in the last 168 hours. Lipid Profile: No results for input(s): CHOL, HDL, LDLCALC, TRIG, CHOLHDL, LDLDIRECT in the last 72 hours. Thyroid Function Tests: No results for input(s): TSH, T4TOTAL, FREET4, T3FREE, THYROIDAB in the last 72 hours. Anemia Panel: No results for input(s): VITAMINB12, FOLATE, FERRITIN, TIBC, IRON, RETICCTPCT in the last 72 hours. Urine analysis:    Component Value Date/Time   COLORURINE YELLOW  04/24/2019 1459   APPEARANCEUR HAZY (A) 04/24/2019 1459   LABSPEC 1.017 04/24/2019 1459   PHURINE 6.0 04/24/2019 1459   GLUCOSEU NEGATIVE 04/24/2019 1459   HGBUR MODERATE (A) 04/24/2019 1459   BILIRUBINUR NEGATIVE 04/24/2019 1459   KETONESUR NEGATIVE 04/24/2019 1459   PROTEINUR 30 (A) 04/24/2019 1459   UROBILINOGEN 0.2 07/11/2007 0010   NITRITE NEGATIVE 04/24/2019 1459   LEUKOCYTESUR MODERATE (A) 04/24/2019 1459    Radiological Exams on Admission: CT ABDOMEN PELVIS W CONTRAST  Result Date: 06/30/2020 CLINICAL DATA:  Epigastric abdominal pain EXAM: CT ABDOMEN AND PELVIS WITH CONTRAST TECHNIQUE: Multidetector CT imaging of the abdomen and pelvis was performed using the standard protocol following bolus administration of intravenous contrast. CONTRAST:  OMNIPAQUE IOHEXOL 300 MG/ML  SOLN COMPARISON:  None. FINDINGS: Lower chest: The visualized lung bases are clear. Right gynecomastia noted. Visualized heart and pericardium are unremarkable. Hepatobiliary: Cholelithiasis without CT evidence of acute cholecystitis. The extrahepatic bile duct is dilated, measuring 11 mm in diameter, best seen on coronal image # 64/6. A a distal obstructing lesion is not clearly identified. No intrahepatic biliary ductal dilation. The liver is unremarkable. Pancreas: Unremarkable Spleen: Unremarkable Adrenals/Urinary Tract: The adrenal glands and kidneys are unremarkable. The bladder is mildly distended, but is otherwise unremarkable. Stomach/Bowel: The large and small bowel, stomach, and appendix are unremarkable. No free intraperitoneal gas or fluid. There is mild wispy mesenteric infiltration at the mesenteric root, nonspecific. Vascular/Lymphatic: Minimal atherosclerotic calcification within the abdominal aorta. The abdominal vasculature is otherwise unremarkable. No pathologic adenopathy within the abdomen and pelvis. Reproductive: Unremarkable Other: Small left fat containing inguinal hernia is present.  Musculoskeletal: Advanced degenerative changes are seen within the lumbar spine. No acute bone abnormality noted. IMPRESSION: Cholelithiasis without CT evidence of acute cholecystitis. Dilation of the extrahepatic bile duct is nonspecific. Correlation with liver enzymes would be helpful to exclude an obstructive process such is ampullary stenosis or potentially debris within the common duct. If abnormal, this could be better assessed with ERCP or MRCP examination. Aortic Atherosclerosis (ICD10-I70.0). Electronically Signed   By: Helyn Numbers MD   On: 06/30/2020 21:54    EKG: Not performed on admission  Assessment/Plan Active Problems:   Cholelithiasis  Cholelithiasis without evidence of acute cholecystitis Patient does present  with abdominal pain and mild leukocytosis He is afebrile Liver function tests and bilirubin within normal limits Given severity of symptoms general surgery was contacted from ED with requests for medicine admission and general surgery evaluation of the morning Plan: Place in observation, MedSurg status Intravenous fluids Multimodal pain control Abdominal ultrasound, right upper quadrant General surgery consult in a.m., order placed for Dr. Lovell SheehanJenkins  Diffuse skeletal hyperostosis Chronic pain Continue multimodal pain control Pain regimen unclear Need to verify after medication reconciliation and restart as appropriate  GERD Increase home PPI to twice daily  Depression Continue home Cymbalta  Hypertension Continue home lisinopril  Insomnia Continue home Restoril    DVT prophylaxis: Lovenox  code Status: Full code Family Communication: None at bedside Disposition Plan: Anticipate return to previous home environment  consults called: General surgery, Dr. Lovell SheehanJenkins (Dr. Tonna BoehringerSakai contacted from ED provider) Admission status: Observation, MedSurg   Tresa MooreSudheer B Elvena Oyer MD [facility]  If 7PM-7AM, please contact night-coverage  06/30/2020, 11:08 PM
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
## 2020-06-30 NOTE — ED Triage Notes (Signed)
Pt brought in by EMS after c/o nausea and abdominal pain that started this am. Pt seen at [facility] this am and given Zofran for nausea and then pt sent back to [place] [facility] [place]. Pt has gallstones.

## 2020-06-30 NOTE — ED Provider Notes (Signed)
[facility] EMERGENCY DEPARTMENT Provider Note   CSN: [id number] Arrival date & time: 06/30/20  1945     History Chief Complaint  Patient presents with  . Abdominal Pain    [name] is a 69 y.o. male.  Pt presents to the ED today with abdominal pain and nausea.  He said he's had nausea every morning for a week.  He developed epigastric abd pain today.  He went to [facility] ED today and was given nausea meds which helped, but sx are back now and worse.  He does have a hx of gallstones diagnosed on [place] several years ago, but they did not bother him then.  He denies any f/c.        Past Medical History:  Diagnosis Date  . Arthritis   . Cataract   . Chronic pain   . DISH (diffuse idiopathic skeletal hyperostosis)   . Gait abnormality 08/29/2019  . GERD (gastroesophageal reflux disease)   . Hypertension   . Impaired hearing   . Peripheral neuropathy 05/13/2020  . Stroke [facility])     Patient Active Problem List   Diagnosis Date Noted  . Peripheral neuropathy 05/13/2020  . 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 Gait abnormality 08/29/2019  . Rhabdomyolysis 04/19/2019  . AKI (acute kidney injury) (HCC) 04/19/2019  . Hypocalcemia 04/19/2019  . Leukocytosis 04/19/2019  . Elevated LFTs 04/19/2019  . DISH (diffuse idiopathic skeletal hyperostosis)   . GERD (gastroesophageal reflux disease)   . Impaired hearing   . Chronic pain     Past Surgical History:  Procedure Laterality Date  . BACK SURGERY     X2  . CATARACT EXTRACTION W/PHACO  12/03/2011   Procedure: CATARACT EXTRACTION PHACO AND INTRAOCULAR LENS PLACEMENT (IOC);  Surgeon: Gemma Payor;  Location: [facility];  Service: Ophthalmology;  Laterality: Right;  CDE: 7.86  . CATARACT EXTRACTION W/PHACO  12/21/2011   Procedure: CATARACT EXTRACTION PHACO AND INTRAOCULAR LENS PLACEMENT (IOC);  Surgeon: Gemma Payor, MD;  Location: [facility];  Service: Ophthalmology;  Laterality: Left;  CDE=5.51  . KNEE ARTHROSCOPY W/ ACL RECONSTRUCTION   05/2000   right, GSBO  . KNEE SURGERY  10/2000   remove screw after ACL  . LAMINECTOMY    . TUMOR EXCISION     back, cyst       Family History  Problem Relation Age of Onset  . Anesthesia problems Neg Hx   . Hypotension Neg Hx   . Malignant hyperthermia Neg Hx   . Pseudochol deficiency Neg Hx     Social History   Tobacco Use  . Smoking status: Never Smoker  . Smokeless tobacco: Current User    Types: Chew  Substance Use Topics  . Alcohol use: No  . Drug use: No    Home Medications Prior to Admission medications   Medication Sig Start Date End Date Taking? Authorizing Provider  carbamide peroxide (DEBROX) 6.5 % OTIC solution 5 drops 2 (two) times daily.    [provider]  cetirizine (ZYRTEC) 10 MG tablet Take 10 mg by mouth daily.    [provider]  DULoxetine (CYMBALTA) 60 MG capsule Take 30 mg by mouth 2 (two) times daily.     [provider]  fentaNYL (DURAGESIC) 12 MCG/HR  08/25/19   [provider]  gabapentin (NEURONTIN) 300 MG capsule 300 mg 2 (two) times daily.  08/24/19   [provider]  HYDROcodone-acetaminophen (NORCO/VICODIN) 5-325 MG tablet  08/25/19   [provider]  ketoconazole (NIZORAL)  2 % shampoo Apply 1 application topically daily as needed for irritation.  12/16/18   [provider]  lansoprazole (PREVACID) 30 MG capsule Take 30 mg by mouth daily.     [provider]  lisinopril (ZESTRIL) 5 MG tablet Take 10 mg by mouth daily.  04/17/19   [provider]  sildenafil (VIAGRA) 100 MG tablet Take 1 tablet by mouth daily as needed for erectile dysfunction.  01/17/19   [provider]  temazepam (RESTORIL) 15 MG capsule Take 1 capsule (15 mg total) by mouth at bedtime as needed for sleep. 04/26/19   Catarina Hartshorn, MD    Allergies    Ceclor [cefaclor], Depakote [divalproex sodium], Nsaids, and Other  Review of Systems   Review of Systems  Gastrointestinal: Positive for  abdominal pain, nausea and vomiting.  All other systems reviewed and are negative.   Physical Exam Updated Vital Signs BP 112/76   Pulse (!) 108   Temp 98.3 F (36.8 C) (Oral)   Resp (!) 25   Ht 5\' 8"  (1.727 m)   Wt (!) 137.4 kg   SpO2 97%   BMI 46.07 kg/m   Physical Exam Vitals and nursing note reviewed.  Constitutional:      Appearance: He is well-developed. He is obese.  HENT:     Head: Normocephalic and atraumatic.     Mouth/Throat:     Mouth: Mucous membranes are moist.     Pharynx: Oropharynx is clear.  Eyes:     Extraocular Movements: Extraocular movements intact.     Pupils: Pupils are equal, round, and reactive to light.  Cardiovascular:     Rate and Rhythm: Normal rate and regular rhythm.     Heart sounds: Normal heart sounds.  Pulmonary:     Effort: Pulmonary effort is normal.     Breath sounds: Normal breath sounds.  Abdominal:     General: Abdomen is flat. Bowel sounds are normal.     Palpations: Abdomen is soft.     Tenderness: There is abdominal tenderness in the right upper quadrant and epigastric area.  Skin:    General: Skin is warm.     Capillary Refill: Capillary refill takes less than 2 seconds.  Neurological:     Mental Status: He is alert and oriented to person, place, and time.     Comments: RUE weakness from prior stroke.  Involuntary muscle spasms to the RUE from stroke.  Psychiatric:        Mood and Affect: Mood normal.        Behavior: Behavior normal.     ED Results / Procedures / Treatments   Labs (all labs ordered are listed, but only abnormal results are displayed) Labs Reviewed  CBC WITH DIFFERENTIAL/PLATELET - Abnormal; Notable for the following components:      Result Value   WBC 12.6 (*)    Neutro Abs 9.9 (*)    All other components within normal limits  COMPREHENSIVE METABOLIC PANEL - Abnormal; Notable for the following components:   Sodium 134 (*)    Chloride 95 (*)    Glucose, Bld 119 (*)    Calcium 8.7 (*)    All  other components within normal limits  SARS CORONAVIRUS 2 BY RT PCR (HOSPITAL ORDER, PERFORMED IN [facility])  LIPASE, BLOOD  URINALYSIS, ROUTINE W REFLEX MICROSCOPIC    EKG None  Radiology CT ABDOMEN PELVIS W CONTRAST  Result Date: 06/30/2020 CLINICAL DATA:  Epigastric abdominal pain EXAM: CT  ABDOMEN AND PELVIS WITH CONTRAST TECHNIQUE: Multidetector CT imaging of the abdomen and pelvis was performed using the standard protocol following bolus administration of intravenous contrast. CONTRAST:  OMNIPAQUE IOHEXOL 300 MG/ML  SOLN COMPARISON:  None. FINDINGS: Lower chest: The visualized lung bases are clear. Right gynecomastia noted. Visualized heart and pericardium are unremarkable. Hepatobiliary: Cholelithiasis without CT evidence of acute cholecystitis. The extrahepatic bile duct is dilated, measuring 11 mm in diameter, best seen on coronal image # 64/6. A a distal obstructing lesion is not clearly identified. No intrahepatic biliary ductal dilation. The liver is unremarkable. Pancreas: Unremarkable Spleen: Unremarkable Adrenals/Urinary Tract: The adrenal glands and kidneys are unremarkable. The bladder is mildly distended, but is otherwise unremarkable. Stomach/Bowel: The large and small bowel, stomach, and appendix are unremarkable. No free intraperitoneal gas or fluid. There is mild wispy mesenteric infiltration at the mesenteric root, nonspecific. Vascular/Lymphatic: Minimal atherosclerotic calcification within the abdominal aorta. The abdominal vasculature is otherwise unremarkable. No pathologic adenopathy within the abdomen and pelvis. Reproductive: Unremarkable Other: Small left fat containing inguinal hernia is present. Musculoskeletal: Advanced degenerative changes are seen within the lumbar spine. No acute bone abnormality noted. IMPRESSION: Cholelithiasis without CT evidence of acute cholecystitis. Dilation of the extrahepatic bile duct is nonspecific. Correlation with  liver enzymes would be helpful to exclude an obstructive process such is ampullary stenosis or potentially debris within the common duct. If abnormal, this could be better assessed with ERCP or MRCP examination. Aortic Atherosclerosis (ICD10-I70.0). Electronically Signed   By: Helyn Numbers MD   On: 06/30/2020 21:54    Procedures Procedures (including critical care time)  Medications Ordered in ED Medications  morphine 4 MG/ML injection 4 mg (4 mg Intravenous Given 06/30/20 2024)  promethazine (PHENERGAN) injection 12.5 mg (12.5 mg Intravenous Given 06/30/20 2024)  iohexol (OMNIPAQUE) 300 MG/ML solution 100 mL (100 mLs Intravenous Contrast Given 06/30/20 2128)  metoCLOPramide (REGLAN) injection 10 mg (10 mg Intravenous Given 06/30/20 2205)  diphenhydrAMINE (BENADRYL) injection 12.5 mg (12.5 mg Intravenous Given 06/30/20 2205)    ED Course  I have reviewed the triage vital signs and the nursing notes.  Pertinent labs & imaging results that were available during my care of the patient were reviewed by me and considered in my medical decision making (see chart for details).    MDM Rules/Calculators/A&P                          Pain and nausea has improved, but it is still there.  Pt d/w Dr. Tonna Boehringer (surgery) who recommends admission to the hospitalist for pain and nausea control and Dr. Lovell Sheehan will see pt in the morning.    Pt d/w Dr. [name] ([facility]) for admission.  Final Clinical Impression(s) / ED Diagnoses Final diagnoses:  Calculus of gallbladder without cholecystitis without obstruction  Epigastric pain  Non-intractable vomiting with nausea, unspecified vomiting type    Rx / DC Orders ED Discharge Orders    None       Jacalyn Lefevre, MD 06/30/20 2301

## 2020-07-01 ENCOUNTER — Observation Stay (HOSPITAL_COMMUNITY): Payer: Medicare HMO

## 2020-07-01 ENCOUNTER — Other Ambulatory Visit: Payer: Self-pay

## 2020-07-01 ENCOUNTER — Encounter (HOSPITAL_COMMUNITY): Payer: Self-pay | Admitting: Internal Medicine

## 2020-07-01 DIAGNOSIS — R1110 Vomiting, unspecified: Secondary | ICD-10-CM

## 2020-07-01 DIAGNOSIS — Z9841 Cataract extraction status, right eye: Secondary | ICD-10-CM | POA: Diagnosis not present

## 2020-07-01 DIAGNOSIS — I10 Essential (primary) hypertension: Secondary | ICD-10-CM

## 2020-07-01 DIAGNOSIS — K2970 Gastritis, unspecified, without bleeding: Secondary | ICD-10-CM | POA: Diagnosis present

## 2020-07-01 DIAGNOSIS — G629 Polyneuropathy, unspecified: Secondary | ICD-10-CM | POA: Diagnosis present

## 2020-07-01 DIAGNOSIS — G8929 Other chronic pain: Secondary | ICD-10-CM | POA: Diagnosis present

## 2020-07-01 DIAGNOSIS — R1013 Epigastric pain: Secondary | ICD-10-CM | POA: Diagnosis present

## 2020-07-01 DIAGNOSIS — G4700 Insomnia, unspecified: Secondary | ICD-10-CM | POA: Diagnosis present

## 2020-07-01 DIAGNOSIS — R112 Nausea with vomiting, unspecified: Secondary | ICD-10-CM | POA: Diagnosis not present

## 2020-07-01 DIAGNOSIS — Z79899 Other long term (current) drug therapy: Secondary | ICD-10-CM | POA: Diagnosis not present

## 2020-07-01 DIAGNOSIS — Z20822 Contact with and (suspected) exposure to covid-19: Secondary | ICD-10-CM | POA: Diagnosis present

## 2020-07-01 DIAGNOSIS — Z888 Allergy status to other drugs, medicaments and biological substances status: Secondary | ICD-10-CM | POA: Diagnosis not present

## 2020-07-01 DIAGNOSIS — H9190 Unspecified hearing loss, unspecified ear: Secondary | ICD-10-CM | POA: Diagnosis present

## 2020-07-01 DIAGNOSIS — Z9842 Cataract extraction status, left eye: Secondary | ICD-10-CM | POA: Diagnosis not present

## 2020-07-01 DIAGNOSIS — Z961 Presence of intraocular lens: Secondary | ICD-10-CM | POA: Diagnosis present

## 2020-07-01 DIAGNOSIS — Z6841 Body Mass Index (BMI) 40.0 and over, adult: Secondary | ICD-10-CM | POA: Diagnosis not present

## 2020-07-01 DIAGNOSIS — K219 Gastro-esophageal reflux disease without esophagitis: Secondary | ICD-10-CM | POA: Diagnosis present

## 2020-07-01 DIAGNOSIS — K8020 Calculus of gallbladder without cholecystitis without obstruction: Secondary | ICD-10-CM | POA: Diagnosis not present

## 2020-07-01 DIAGNOSIS — D72829 Elevated white blood cell count, unspecified: Secondary | ICD-10-CM | POA: Diagnosis present

## 2020-07-01 DIAGNOSIS — F329 Major depressive disorder, single episode, unspecified: Secondary | ICD-10-CM | POA: Diagnosis present

## 2020-07-01 DIAGNOSIS — M4810 Ankylosing hyperostosis [Forestier], site unspecified: Secondary | ICD-10-CM | POA: Diagnosis present

## 2020-07-01 DIAGNOSIS — Z8673 Personal history of transient ischemic attack (TIA), and cerebral infarction without residual deficits: Secondary | ICD-10-CM | POA: Diagnosis not present

## 2020-07-01 DIAGNOSIS — F17220 Nicotine dependence, chewing tobacco, uncomplicated: Secondary | ICD-10-CM | POA: Diagnosis present

## 2020-07-01 DIAGNOSIS — K8070 Calculus of gallbladder and bile duct without cholecystitis without obstruction: Secondary | ICD-10-CM | POA: Diagnosis present

## 2020-07-01 LAB — CBC
HCT: 39.8 % (ref 39.0–52.0)
Hemoglobin: 12.7 g/dL — ABNORMAL LOW (ref 13.0–17.0)
MCH: 27.4 pg (ref 26.0–34.0)
MCHC: 31.9 g/dL (ref 30.0–36.0)
MCV: 86 fL (ref 80.0–100.0)
Platelets: 237 10*3/uL (ref 150–400)
RBC: 4.63 MIL/uL (ref 4.22–5.81)
RDW: 14.3 % (ref 11.5–15.5)
WBC: 11.6 10*3/uL — ABNORMAL HIGH (ref 4.0–10.5)
nRBC: 0 % (ref 0.0–0.2)

## 2020-07-01 LAB — COMPREHENSIVE METABOLIC PANEL
ALT: 31 U/L (ref 0–44)
AST: 21 U/L (ref 15–41)
Albumin: 3.7 g/dL (ref 3.5–5.0)
Alkaline Phosphatase: 54 U/L (ref 38–126)
Anion gap: 10 (ref 5–15)
BUN: 17 mg/dL (ref 8–23)
CO2: 29 mmol/L (ref 22–32)
Calcium: 8.7 mg/dL — ABNORMAL LOW (ref 8.9–10.3)
Chloride: 97 mmol/L — ABNORMAL LOW (ref 98–111)
Creatinine, Ser: 1.06 mg/dL (ref 0.61–1.24)
GFR calc Af Amer: >60 mL/min (ref >60)
GFR calc non Af Amer: >60 mL/min (ref >60)
Glucose, Bld: 100 mg/dL — ABNORMAL HIGH (ref 70–99)
Potassium: 4.7 mmol/L (ref 3.5–5.1)
Sodium: 136 mmol/L (ref 135–145)
Total Bilirubin: 0.4 mg/dL (ref 0.3–1.2)
Total Protein: 7 g/dL (ref 6.5–8.1)

## 2020-07-01 LAB — HIV ANTIBODY (ROUTINE TESTING W REFLEX): HIV Screen 4th Generation wRfx: NONREACTIVE

## 2020-07-01 LAB — PROTIME-INR
INR: 1.1 (ref 0.8–1.2)
Prothrombin Time: 13.6 seconds (ref 11.4–15.2)

## 2020-07-01 MED ORDER — GADOBUTROL 1 MMOL/ML IV SOLN
10.0000 mL | Freq: Once | INTRAVENOUS | Status: AC | PRN
  Administered 2020-07-01: 10 mL via INTRAVENOUS

## 2020-07-01 NOTE — Plan of Care (Signed)

## 2020-07-01 NOTE — Progress Notes (Signed)
PROGRESS NOTE    [name]  WUJ:[id number] DOB: 1951/08/12 DOA: 06/30/2020 PCP: [name], MD   Chief Complaint  Patient presents with  . Abdominal Pain    Brief Narrative:  As per H&P written by Dr. [name] on 06/30/20 [name] is a 69 y.o. male with medical history significant of GERD, depression, morbid obesity, chronic pain, diffuse idiopathic skeletal hyperostosis who presents to the ED for evaluation of abdominal pain.  Patient reports abdominal pain every day for a week.  Epigastric pain occurred today.  The patient was previously at [facility] ED earlier today and was given nausea medications which initially helped and the patient was discharged from the emergency department.  However symptoms are back and the patient presented to [name] ED  Patient does have a history of gallstones for which he was diagnosed several years ago via ultrasound however at that time they were not bothersome.  On presentation today ultrasound could not be performed due to lack of ability ability however abdominal CT was performed that did demonstrate cholelithiasis without radiographic evidence of acute cholecystitis.  Lab work was significant for mild leukocytosis of 12.6 with neutrophilic predominance.  Remainder of laboratory work-up overall unrevealing.  The patient did endorse severe abdominal pain was given pain control and antiemetics with good result.  On my evaluation patient is resting comfortably.  He is in no visible distress.  ER physician spoke with covering surgery Dr. Tonna Boehringer who requested medicine admission for completion of abdominal ultrasound and general surgery consult in the morning.   ED Course: Initial vital signs are reassuring.  Patient was given intravenous morphine and Reglan for pain and nausea control.  Abdominal CT completed, results as above.  Hospitalist contacted for admission  Assessment & Plan: 1-cholelithiasis with concern for biliary colic -No fever  and just mild elevation of WBCs currently -CT scan confirming cholelithiasis without signs of cholecystitis; there is concern for biliary duct dilatation. -Patient symptoms has improved with conservative management and following general surgery recommendations will pursuit with MRCP and HIDA scan evaluation. -Allowed to have clear liquid diet today -Continue as needed antiemetics and analgesics -Follow images results and decisions for the need of surgical intervention.  2-gastroesophageal reflux disease -Continue PPI.  3-diffuse skeletal hyperstosis and chronic pain -Continue as needed analgesics and supportive care.  4-hypertension -Continue current antihypertensive regimen -Blood pressure is stable.  5-depression/insomnia -No suicidal ideation or hallucination: -Continue the use of Cymbalta and Restoril.  6-morbid obesity -Low calorie diet and portion control has been discussed with patient -Body mass index is 45.19 kg/m.   DVT prophylaxis: Lovenox Code Status: Full code Family Communication: No family at bedside. Disposition:   Status is: Inpatient  Dispo: The patient is from: Assisted living facility              Anticipated d/c is to: Assisted living facility              Anticipated d/c date is: To be determined (based on the need for surgical intervention)              Patient currently No medically stable for discharge, still with intermittent nausea and complaining of mid epigastric/right upper quadrant pain.  Following general surgery recommendations will proceed MRCP and HIDA scan evaluation, to further determine the need of surgical intervention.  Continue as needed antiemetics and analgesics.       Consultants:  General surgery   Procedures:  See below for x-ray reports  Antimicrobials:  None  Subjective: Afebrile, reporting mild intermittent nausea and midepigastric discomfort.  No fever, no chest pain, no shortness of breath.  Objective: Vitals:    07/01/20 0330 07/01/20 0514 07/01/20 0900 07/01/20 0930  BP: 118/74 112/68 113/67 113/66  Pulse: 97 98  (!) 106  Resp:  18  18  Temp:  98.6 F (37 C)  98.8 F (37.1 C)  TempSrc:  Oral  Tympanic  SpO2: 90% 96%  95%  Weight:  (!) 134.8 kg    Height:  5\' 8"  (1.727 m)      Intake/Output Summary (Last 24 hours) at 07/01/2020 1306 Last data filed at 07/01/2020 0900 Gross per 24 hour  Intake 404.04 ml  Output 300 ml  Net 104.04 ml   Filed Weights   06/30/20 1951 07/01/20 0514  Weight: (!) 137.4 kg (!) 134.8 kg    Examination:  General exam: Appears calm and currently complaining mild midepigastric discomfort and intermittent nausea.  No further vomiting and requesting something to drink.  Patient is afebrile. Respiratory system: Clear to auscultation. Respiratory effort normal. Cardiovascular system: S1 & S2 heard, RRR. No JVD, murmurs, rubs, gallops or clicks.  Gastrointestinal system: Abdomen is obese, nondistended, soft and mildly tender to palpation in his right upper quadrant and midepigastric area. No organomegaly or masses felt. Normal bowel sounds heard. Central nervous system: Alert and oriented. No focal neurological deficits. Extremities: No cyanosis or clubbing. Skin: No rashes, no petechiae. Psychiatry: Judgement and insight appear normal. Mood & affect appropriate.     Data Reviewed: I have personally reviewed following labs and imaging studies  CBC: Recent Labs  Lab 06/30/20 2006 07/01/20 0321  WBC 12.6* 11.6*  NEUTROABS 9.9*  --   HGB 13.4 12.7*  HCT 42.0 39.8  MCV 84.5 86.0  PLT PLATELET CLUMPS NOTED ON SMEAR, COUNT APPEARS ADEQUATE 237    Basic Metabolic Panel: Recent Labs  Lab 06/30/20 2006 07/01/20 0321  NA 134* 136  K 3.6 4.7  CL 95* 97*  CO2 27 29  GLUCOSE 119* 100*  BUN 19 17  CREATININE 1.07 1.06  CALCIUM 8.7* 8.7*    GFR: Estimated Creatinine Clearance: 87.1 mL/min (by C-G formula based on SCr of 1.06 mg/dL).  Liver Function  Tests: Recent Labs  Lab 06/30/20 2006 07/01/20 0321  AST 29 21  ALT 35 31  ALKPHOS 61 54  BILITOT 0.5 0.4  PROT 7.6 7.0  ALBUMIN 4.0 3.7    CBG: No results for input(s): GLUCAP in the last 168 hours.   Recent Results (from the past 240 hour(s))  SARS Coronavirus 2 by RT PCR (hospital order, [facility]) Nasopharyngeal Nasopharyngeal Swab     Status: None   Collection Time: 06/30/20 10:33 PM   Specimen: Nasopharyngeal Swab  Result Value Ref Range Status   SARS Coronavirus 2 NEGATIVE NEGATIVE Final    Comment: (NOTE) SARS-CoV-2 target nucleic acids are NOT DETECTED.  The SARS-CoV-2 RNA is generally detectable in upper and lower respiratory specimens during the acute phase of infection. The lowest concentration of SARS-CoV-2 viral copies this assay can detect is 250 copies / mL. A negative result does not preclude SARS-CoV-2 infection and should not be used as the sole basis for treatment or other patient management decisions.  A negative result may occur with improper specimen collection / handling, submission of specimen other than nasopharyngeal swab, presence of viral mutation(s) within the areas targeted by this assay, and inadequate number of viral copies (<  250 copies / mL). A negative result must be combined with clinical observations, patient history, and epidemiological information.  Fact Sheet for Patients:   BoilerBrush.com.cy  Fact Sheet for Healthcare Providers: https://pope.com/  This test is not yet approved or  cleared by the [place] FDA and has been authorized for detection and/or diagnosis of SARS-CoV-2 by FDA under an Emergency Use Authorization (EUA).  This EUA will remain in effect (meaning this test can be used) for the duration of the COVID-19 declaration under Section 564(b)(1) of the Act, 21 U.S.C. section 360bbb-3(b)(1), unless the authorization is terminated  or revoked sooner.  Performed at [facility], [address], [place], [place] [address]      Radiology Studies: CT ABDOMEN PELVIS W CONTRAST  Result Date: 06/30/2020 CLINICAL DATA:  Epigastric abdominal pain EXAM: CT ABDOMEN AND PELVIS WITH CONTRAST TECHNIQUE: Multidetector CT imaging of the abdomen and pelvis was performed using the standard protocol following bolus administration of intravenous contrast. CONTRAST:  OMNIPAQUE IOHEXOL 300 MG/ML  SOLN COMPARISON:  None. FINDINGS: Lower chest: The visualized lung bases are clear. Right gynecomastia noted. Visualized heart and pericardium are unremarkable. Hepatobiliary: Cholelithiasis without CT evidence of acute cholecystitis. The extrahepatic bile duct is dilated, measuring 11 mm in diameter, best seen on coronal image # 64/6. A a distal obstructing lesion is not clearly identified. No intrahepatic biliary ductal dilation. The liver is unremarkable. Pancreas: Unremarkable Spleen: Unremarkable Adrenals/Urinary Tract: The adrenal glands and kidneys are unremarkable. The bladder is mildly distended, but is otherwise unremarkable. Stomach/Bowel: The large and small bowel, stomach, and appendix are unremarkable. No free intraperitoneal gas or fluid. There is mild wispy mesenteric infiltration at the mesenteric root, nonspecific. Vascular/Lymphatic: Minimal atherosclerotic calcification within the abdominal aorta. The abdominal vasculature is otherwise unremarkable. No pathologic adenopathy within the abdomen and pelvis. Reproductive: Unremarkable Other: Small left fat containing inguinal hernia is present. Musculoskeletal: Advanced degenerative changes are seen within the lumbar spine. No acute bone abnormality noted. IMPRESSION: Cholelithiasis without CT evidence of acute cholecystitis. Dilation of the extrahepatic bile duct is nonspecific. Correlation with liver enzymes would be helpful to exclude an obstructive process such is ampullary stenosis or  potentially debris within the common duct. If abnormal, this could be better assessed with ERCP or MRCP examination. Aortic Atherosclerosis (ICD10-I70.0). Electronically Signed   By: Helyn Numbers MD   On: 06/30/2020 21:54   US Abdomen Limited RUQ  Result Date: 07/01/2020 CLINICAL DATA:  Questionable lesion common bile duct on recent CT EXAM: ULTRASOUND ABDOMEN LIMITED RIGHT UPPER QUADRANT COMPARISON:  CT abdomen and pelvis June 30, 2020 FINDINGS: Gallbladder: Within the gallbladder, there are echogenic foci which move and shadow consistent with cholelithiasis. Largest gallstone measures 2.0 cm in length. There is no gallbladder wall thickening or pericholecystic fluid. No sonographic Murphy sign noted by sonographer. Common bile duct: Diameter: There is prominence of the common bile duct measuring up to 10 mm in the mid to distal aspect. Note that a portion of the distal most aspect of common bile duct is obscured by gas. There is no intrahepatic biliary duct dilatation appreciable on this study. Liver: No focal lesion identified. Liver echogenicity is increased diffusely. Portal vein is patent on color Doppler imaging with normal direction of blood flow towards the liver. Other: None. IMPRESSION: 1. There is extrahepatic biliary duct dilatation. An obstructing lesion is not seen in the visualized portions of the common bile duct. Note, however, that a portion of the distal common bile duct is obscured  by gas. Given the CT appearance and in the lack of visualization of the distal most aspect of the common bile duct on this study, correlation with MRCP may well be advised to further evaluate for potential distal obstructing biliary ductal lesion. 2. Cholelithiasis. No gallbladder wall thickening or pericholecystic fluid. 3. Diffuse increase in liver echogenicity, a finding most likely due to hepatic steatosis. No focal liver lesions are evident on this study; it must be cautioned that the sensitivity of  ultrasound for detection of focal liver lesions is diminished significantly in this circumstance. Electronically Signed   By: Bretta Bang III M.D.   On: 07/01/2020 09:37    Scheduled Meds: . DULoxetine  30 mg Oral BID  . enoxaparin (LOVENOX) injection  60 mg Subcutaneous Q24H  . lisinopril  10 mg Oral Daily  . loratadine  10 mg Oral Daily  . pantoprazole  40 mg Oral BID   Continuous Infusions: . sodium chloride 75 mL/hr at 07/01/20 0458     LOS: 0 days    Time spent: 30 minutes    Vassie Loll, MD [facility]   To contact the attending provider between 7A-7P or the covering provider during after hours 7P-7A, please log into the web site [email or web address] and access using universal [place] password for that web site. If you do not have the password, please call the hospital operator.  07/01/2020, 1:06 PM

## 2020-07-01 NOTE — Consult Note (Signed)
Reason for Consult: Nausea, epigastric pain Referring Physician: Dr. Tori Milks Selley is an 69 y.o. male.  HPI: Patient is a 69 year old white male who was admitted to [facility] with right upper quadrant abdominal pain and nausea.  CT scan of the abdomen revealed cholelithiasis, though he did not have acute cholecystitis.  He comes from an assisted home.  His liver enzyme tests were all within normal limits.  He denies any fatty food intolerance.  His complaint was mostly nausea and epigastric discomfort.  Ultrasound the gallbladder this morning reveals cholelithiasis with a dilated hepatobiliary tree, but no choledocholithiasis.  No fever chills have been noted.  No history of jaundice has been noted.  Past Medical History:  Diagnosis Date  . Arthritis   . Cataract   . Chronic pain   . DISH (diffuse idiopathic skeletal hyperostosis)   . Gait abnormality 08/29/2019  . GERD (gastroesophageal reflux disease)   . Hypertension   . Impaired hearing   . Peripheral neuropathy 05/13/2020  . Stroke [facility])     Past Surgical History:  Procedure Laterality Date  . BACK SURGERY     X2  . CATARACT EXTRACTION W/PHACO  12/03/2011   Procedure: CATARACT EXTRACTION PHACO AND INTRAOCULAR LENS PLACEMENT (IOC);  Surgeon: Gemma Payor;  Location: [facility];  Service: Ophthalmology;  Laterality: Right;  CDE: 7.86  . CATARACT EXTRACTION W/PHACO  12/21/2011   Procedure: CATARACT EXTRACTION PHACO AND INTRAOCULAR LENS PLACEMENT (IOC);  Surgeon: Gemma Payor, MD;  Location: [facility];  Service: Ophthalmology;  Laterality: Left;  CDE=5.51  . KNEE ARTHROSCOPY W/ ACL RECONSTRUCTION  05/2000   right, [facility]  . KNEE SURGERY  10/2000   remove screw after ACL  . LAMINECTOMY    . TUMOR EXCISION     back, cyst    Family History  Problem Relation Age of Onset  . Anesthesia problems Neg Hx   . Hypotension Neg Hx   . Malignant hyperthermia Neg Hx   . Pseudochol deficiency Neg Hx     Social History:  reports that he  has never smoked. His smokeless tobacco use includes chew. He reports that he does not drink alcohol and does not use drugs.  Allergies:  Allergies  Allergen Reactions  . Ceclor [Cefaclor] Other (See Comments)    Keeps patient wake for days.   . Depakote [Divalproex Sodium]     Stomach upset  . Nsaids Other (See Comments)    G.I. Upset  . Other Other (See Comments)    Steroids make patient extremely agitated     Medications: I have reviewed the patient's current medications.  Results for orders placed or performed during the hospital encounter of 06/30/20 (from the past 48 hour(s))  CBC with Differential     Status: Abnormal   Collection Time: 06/30/20  8:06 PM  Result Value Ref Range   WBC 12.6 (H) 4.0 - 10.5 K/uL   RBC 4.97 4.22 - 5.81 MIL/uL   Hemoglobin 13.4 13.0 - 17.0 g/dL   HCT 16.1 39 - 52 %   MCV 84.5 80.0 - 100.0 fL   MCH 27.0 26.0 - 34.0 pg   MCHC 31.9 30.0 - 36.0 g/dL   RDW 09.6 04.5 - 40.9 %   Platelets  150 - 400 K/uL    PLATELET CLUMPS NOTED ON SMEAR, COUNT APPEARS ADEQUATE   nRBC 0.0 0.0 - 0.2 %   Neutrophils Relative % 78 %   Neutro Abs 9.9 (H) 1.7 - 7.7 K/uL  Lymphocytes Relative 14 %   Lymphs Abs 1.7 0.7 - 4.0 K/uL   Monocytes Relative 7 %   Monocytes Absolute 0.8 0 - 1 K/uL   Eosinophils Relative 1 %   Eosinophils Absolute 0.1 0 - 0 K/uL   Basophils Relative 0 %   Basophils Absolute 0.0 0 - 0 K/uL   Immature Granulocytes 0 %   Abs Immature Granulocytes 0.04 0.00 - 0.07 K/uL    Comment: Performed at [facility], [address] [place], [place] [address]  Comprehensive metabolic panel     Status: Abnormal   Collection Time: 06/30/20  8:06 PM  Result Value Ref Range   Sodium 134 (L) 135 - 145 mmol/L   Potassium 3.6 3.5 - 5.1 mmol/L   Chloride 95 (L) 98 - 111 mmol/L   CO2 27 22 - 32 mmol/L   Glucose, Bld 119 (H) 70 - 99 mg/dL    Comment: Glucose reference range applies only to samples taken after fasting for at least 8 hours.   BUN 19 8 - 23  mg/dL   Creatinine, Ser 7.41 0.61 - 1.24 mg/dL   Calcium 8.7 (L) 8.9 - 10.3 mg/dL   Total Protein 7.6 6.5 - 8.1 g/dL   Albumin 4.0 3.5 - 5.0 g/dL   AST 29 15 - 41 U/L   ALT 35 0 - 44 U/L   Alkaline Phosphatase 61 38 - 126 U/L   Total Bilirubin 0.5 0.3 - 1.2 mg/dL   GFR calc non Af Amer >60 >60 mL/min   GFR calc Af Amer >60 >60 mL/min   Anion gap 12 5 - 15    Comment: Performed at [facility], [address], [place], [place] [address]  Lipase, blood     Status: None   Collection Time: 06/30/20  8:06 PM  Result Value Ref Range   Lipase 30 11 - 51 U/L    Comment: Performed at [facility], [address] [place], [place] [address]  Urinalysis, Routine w reflex microscopic     Status: Abnormal   Collection Time: 06/30/20  8:06 PM  Result Value Ref Range   Color, Urine YELLOW YELLOW   APPearance CLEAR CLEAR   Specific Gravity, Urine 1.032 (H) 1.005 - 1.030   pH 7.0 5.0 - 8.0   Glucose, UA NEGATIVE NEGATIVE mg/dL   Hgb urine dipstick SMALL (A) NEGATIVE   Bilirubin Urine NEGATIVE NEGATIVE   Ketones, ur NEGATIVE NEGATIVE mg/dL   Protein, ur NEGATIVE NEGATIVE mg/dL   Nitrite NEGATIVE NEGATIVE   Leukocytes,Ua NEGATIVE NEGATIVE   RBC / HPF 0-5 0 - 5 RBC/hpf   WBC, UA 0-5 0 - 5 WBC/hpf   Bacteria, UA NONE SEEN NONE SEEN   Squamous Epithelial / LPF 0-5 0 - 5    Comment: Performed at [facility], [address] [place], [place] [address]  SARS Coronavirus 2 by RT PCR (hospital order, performed in [facility] hospital lab) Nasopharyngeal Nasopharyngeal Swab     Status: None   Collection Time: 06/30/20 10:33 PM   Specimen: Nasopharyngeal Swab  Result Value Ref Range   SARS Coronavirus 2 NEGATIVE NEGATIVE    Comment: (NOTE) SARS-CoV-2 target nucleic acids are NOT DETECTED.  The SARS-CoV-2 RNA is generally detectable in upper and lower respiratory specimens during the acute phase of infection. The lowest concentration of SARS-CoV-2 viral copies this assay can detect is 250 copies /  mL. A negative result does not preclude SARS-CoV-2 infection and should not be used as  the sole basis for treatment or other patient management decisions.  A negative result may occur with improper specimen collection / handling, submission of specimen other than nasopharyngeal swab, presence of viral mutation(s) within the areas targeted by this assay, and inadequate number of viral copies (<250 copies / mL). A negative result must be combined with clinical observations, patient history, and epidemiological information.  Fact Sheet for Patients:   BoilerBrush.com.cyhttps://www.fda.gov/media/136312/download  Fact Sheet for Healthcare Providers: https://pope.com/https://www.fda.gov/media/136313/download  This test is not yet approved or  cleared by the [place] FDA and has been authorized for detection and/or diagnosis of SARS-CoV-2 by FDA under an Emergency Use Authorization (EUA).  This EUA will remain in effect (meaning this test can be used) for the duration of the COVID-19 declaration under Section 564(b)(1) of the Act, 21 U.S.C. section 360bbb-3(b)(1), unless the authorization is terminated or revoked sooner.  Performed at [facility], [address] [place], [place] [address]   Comprehensive metabolic panel     Status: Abnormal   Collection Time: 07/01/20  3:21 AM  Result Value Ref Range   Sodium 136 135 - 145 mmol/L   Potassium 4.7 3.5 - 5.1 mmol/L    Comment: DELTA CHECK NOTED   Chloride 97 (L) 98 - 111 mmol/L   CO2 29 22 - 32 mmol/L   Glucose, Bld 100 (H) 70 - 99 mg/dL    Comment: Glucose reference range applies only to samples taken after fasting for at least 8 hours.   BUN 17 8 - 23 mg/dL   Creatinine, Ser 6.041.06 0.61 - 1.24 mg/dL   Calcium 8.7 (L) 8.9 - 10.3 mg/dL   Total Protein 7.0 6.5 - 8.1 g/dL   Albumin 3.7 3.5 - 5.0 g/dL   AST 21 15 - 41 U/L   ALT 31 0 - 44 U/L   Alkaline Phosphatase 54 38 - 126 U/L   Total Bilirubin 0.4 0.3 - 1.2 mg/dL   GFR calc non Af Amer >60 >60 mL/min   GFR calc  Af Amer >60 >60 mL/min   Anion gap 10 5 - 15    Comment: Performed at [facility], [address] [place], [place] [address]  Protime-INR     Status: None   Collection Time: 07/01/20  3:21 AM  Result Value Ref Range   Prothrombin Time 13.6 11.4 - 15.2 seconds   INR 1.1 0.8 - 1.2    Comment: (NOTE) INR goal varies based on device and disease states. Performed at [facility], [address] [place], [place] [address]   CBC     Status: Abnormal   Collection Time: 07/01/20  3:21 AM  Result Value Ref Range   WBC 11.6 (H) 4.0 - 10.5 K/uL   RBC 4.63 4.22 - 5.81 MIL/uL   Hemoglobin 12.7 (L) 13.0 - 17.0 g/dL   HCT 78.239.8 39 - 52 %   MCV 86.0 80.0 - 100.0 fL   MCH 27.4 26.0 - 34.0 pg   MCHC 31.9 30.0 - 36.0 g/dL   RDW 95.614.3 21.311.5 - 08.615.5 %   Platelets 237 150 - 400 K/uL   nRBC 0.0 0.0 - 0.2 %    Comment: Performed at [facility], [address] [place], [place] [address]    CT ABDOMEN PELVIS W CONTRAST  Result Date: 06/30/2020 CLINICAL DATA:  Epigastric abdominal pain EXAM: CT ABDOMEN AND PELVIS WITH CONTRAST TECHNIQUE: Multidetector CT imaging of the abdomen and pelvis was performed using the standard protocol following bolus administration of  intravenous contrast. CONTRAST:  OMNIPAQUE IOHEXOL 300 MG/ML  SOLN COMPARISON:  None. FINDINGS: Lower chest: The visualized lung bases are clear. Right gynecomastia noted. Visualized heart and pericardium are unremarkable. Hepatobiliary: Cholelithiasis without CT evidence of acute cholecystitis. The extrahepatic bile duct is dilated, measuring 11 mm in diameter, best seen on coronal image # 64/6. A a distal obstructing lesion is not clearly identified. No intrahepatic biliary ductal dilation. The liver is unremarkable. Pancreas: Unremarkable Spleen: Unremarkable Adrenals/Urinary Tract: The adrenal glands and kidneys are unremarkable. The bladder is mildly distended, but is otherwise unremarkable. Stomach/Bowel: The large and small bowel, stomach, and  appendix are unremarkable. No free intraperitoneal gas or fluid. There is mild wispy mesenteric infiltration at the mesenteric root, nonspecific. Vascular/Lymphatic: Minimal atherosclerotic calcification within the abdominal aorta. The abdominal vasculature is otherwise unremarkable. No pathologic adenopathy within the abdomen and pelvis. Reproductive: Unremarkable Other: Small left fat containing inguinal hernia is present. Musculoskeletal: Advanced degenerative changes are seen within the lumbar spine. No acute bone abnormality noted. IMPRESSION: Cholelithiasis without CT evidence of acute cholecystitis. Dilation of the extrahepatic bile duct is nonspecific. Correlation with liver enzymes would be helpful to exclude an obstructive process such is ampullary stenosis or potentially debris within the common duct. If abnormal, this could be better assessed with ERCP or MRCP examination. Aortic Atherosclerosis (ICD10-I70.0). Electronically Signed   By: Helyn Numbers MD   On: 06/30/2020 21:54   US Abdomen Limited RUQ  Result Date: 07/01/2020 CLINICAL DATA:  Questionable lesion common bile duct on recent CT EXAM: ULTRASOUND ABDOMEN LIMITED RIGHT UPPER QUADRANT COMPARISON:  CT abdomen and pelvis June 30, 2020 FINDINGS: Gallbladder: Within the gallbladder, there are echogenic foci which move and shadow consistent with cholelithiasis. Largest gallstone measures 2.0 cm in length. There is no gallbladder wall thickening or pericholecystic fluid. No sonographic Murphy sign noted by sonographer. Common bile duct: Diameter: There is prominence of the common bile duct measuring up to 10 mm in the mid to distal aspect. Note that a portion of the distal most aspect of common bile duct is obscured by gas. There is no intrahepatic biliary duct dilatation appreciable on this study. Liver: No focal lesion identified. Liver echogenicity is increased diffusely. Portal vein is patent on color Doppler imaging with normal direction of  blood flow towards the liver. Other: None. IMPRESSION: 1. There is extrahepatic biliary duct dilatation. An obstructing lesion is not seen in the visualized portions of the common bile duct. Note, however, that a portion of the distal common bile duct is obscured by gas. Given the CT appearance and in the lack of visualization of the distal most aspect of the common bile duct on this study, correlation with MRCP may well be advised to further evaluate for potential distal obstructing biliary ductal lesion. 2. Cholelithiasis. No gallbladder wall thickening or pericholecystic fluid. 3. Diffuse increase in liver echogenicity, a finding most likely due to hepatic steatosis. No focal liver lesions are evident on this study; it must be cautioned that the sensitivity of ultrasound for detection of focal liver lesions is diminished significantly in this circumstance. Electronically Signed   By: Bretta Bang III M.D.   On: 07/01/2020 09:37    ROS:  Pertinent items are noted in HPI.  Blood pressure 113/66, pulse (!) 106, temperature 98.8 F (37.1 C), temperature source Tympanic, resp. rate 18, height 5\' 8"  (1.727 m), weight (!) 134.8 kg, SpO2 95 %. Physical Exam: Pleasant white male no acute distress Head is normocephalic, atraumatic Eyes  without scleral icterus Abdomen is soft and rotund with some epigastric discomfort to deep palpation.  No rigidity is noted.  Unable to appreciate hepatosplenomegaly due to body habitus.  Assessment/Plan: Impression: Cholelithiasis with a dilated common bile duct.  Patient seems to have atypical symptoms for biliary colic.  Patient was seen alongside with Dr. Gwenlyn Perking. Plan: We will get MRCP for further evaluation.  Further management is pending those results.  We will get HIDA scan tomorrow to fully assess for biliary colic.  Franky Macho 07/01/2020, 11:00 AM

## 2020-07-02 ENCOUNTER — Inpatient Hospital Stay (HOSPITAL_COMMUNITY): Payer: Medicare HMO

## 2020-07-02 DIAGNOSIS — K8020 Calculus of gallbladder without cholecystitis without obstruction: Secondary | ICD-10-CM | POA: Diagnosis not present

## 2020-07-02 DIAGNOSIS — R112 Nausea with vomiting, unspecified: Secondary | ICD-10-CM | POA: Diagnosis not present

## 2020-07-02 DIAGNOSIS — I10 Essential (primary) hypertension: Secondary | ICD-10-CM | POA: Diagnosis not present

## 2020-07-02 MED ORDER — PANTOPRAZOLE SODIUM 40 MG PO TBEC: 40.0000 mg | DELAYED_RELEASE_TABLET | Freq: Two times a day (BID) | ORAL | 1 refills | Status: AC

## 2020-07-02 MED ORDER — ONDANSETRON HCL 8 MG PO TABS: 4.0000 mg | ORAL_TABLET | Freq: Three times a day (TID) | ORAL | 0 refills | Status: AC | PRN

## 2020-07-02 MED ORDER — STERILE WATER FOR INJECTION IJ SOLN
INTRAMUSCULAR | Status: AC
  Filled 2020-07-02: qty 10

## 2020-07-02 MED ORDER — FAMOTIDINE 20 MG PO TABS: 20.0000 mg | ORAL_TABLET | Freq: Every day | ORAL | 1 refills | Status: AC

## 2020-07-02 MED ORDER — TECHNETIUM TC 99M MEBROFENIN IV KIT
5.0000 | PACK | Freq: Once | INTRAVENOUS | Status: AC | PRN
  Administered 2020-07-02: 4.98 via INTRAVENOUS

## 2020-07-02 MED ORDER — SINCALIDE 5 MCG IJ SOLR
INTRAMUSCULAR | Status: AC
  Administered 2020-07-02: 2.73 ug
  Filled 2020-07-02: qty 5

## 2020-07-02 MED ORDER — ALPRAZOLAM 1 MG PO TABS: 1.0000 mg | ORAL_TABLET | Freq: Two times a day (BID) | ORAL | 0 refills | Status: AC | PRN

## 2020-07-02 MED ORDER — OXYCODONE HCL 5 MG PO CAPS: 5.0000 mg | ORAL_CAPSULE | Freq: Three times a day (TID) | ORAL | 0 refills | Status: AC

## 2020-07-02 NOTE — Progress Notes (Signed)
Subjective: Patient denies any abdominal pain, nausea, vomiting.  Objective: Vital signs in last 24 hours: Temp:  [98.5 F (36.9 C)-99 F (37.2 C)] 98.5 F (36.9 C) (07/27 0449) Pulse Rate:  [89-108] 89 (07/27 0449) Resp:  [18-20] 20 (07/27 0449) BP: (90-126)/(56-78) 90/56 (07/27 0449) SpO2:  [93 %-97 %] 96 % (07/27 0449) Last BM Date: 07/01/20  Intake/Output from previous day: 07/26 0701 - 07/27 0700 In: 2131.5 [P.O.:920; I.V.:1211.5] Out: 1036 [Urine:1035; Stool:1] Intake/Output this shift: No intake/output data recorded.  General appearance: alert, cooperative and no distress GI: soft, non-tender; bowel sounds normal; no masses,  no organomegaly  Lab Results:  Recent Labs    06/30/20 2006 07/01/20 0321  WBC 12.6* 11.6*  HGB 13.4 12.7*  HCT 42.0 39.8  PLT PLATELET CLUMPS NOTED ON SMEAR, COUNT APPEARS ADEQUATE 237   BMET Recent Labs    06/30/20 2006 07/01/20 0321  NA 134* 136  K 3.6 4.7  CL 95* 97*  CO2 27 29  GLUCOSE 119* 100*  BUN 19 17  CREATININE 1.07 1.06  CALCIUM 8.7* 8.7*   PT/INR Recent Labs    07/01/20 0321  LABPROT 13.6  INR 1.1    Studies/Results: CT ABDOMEN PELVIS W CONTRAST  Result Date: 06/30/2020 CLINICAL DATA:  Epigastric abdominal pain EXAM: CT ABDOMEN AND PELVIS WITH CONTRAST TECHNIQUE: Multidetector CT imaging of the abdomen and pelvis was performed using the standard protocol following bolus administration of intravenous contrast. CONTRAST:  OMNIPAQUE IOHEXOL 300 MG/ML  SOLN COMPARISON:  None. FINDINGS: Lower chest: The visualized lung bases are clear. Right gynecomastia noted. Visualized heart and pericardium are unremarkable. Hepatobiliary: Cholelithiasis without CT evidence of acute cholecystitis. The extrahepatic bile duct is dilated, measuring 11 mm in diameter, best seen on coronal image # 64/6. A a distal obstructing lesion is not clearly identified. No intrahepatic biliary ductal dilation. The liver is unremarkable.  Pancreas: Unremarkable Spleen: Unremarkable Adrenals/Urinary Tract: The adrenal glands and kidneys are unremarkable. The bladder is mildly distended, but is otherwise unremarkable. Stomach/Bowel: The large and small bowel, stomach, and appendix are unremarkable. No free intraperitoneal gas or fluid. There is mild wispy mesenteric infiltration at the mesenteric root, nonspecific. Vascular/Lymphatic: Minimal atherosclerotic calcification within the abdominal aorta. The abdominal vasculature is otherwise unremarkable. No pathologic adenopathy within the abdomen and pelvis. Reproductive: Unremarkable Other: Small left fat containing inguinal hernia is present. Musculoskeletal: Advanced degenerative changes are seen within the lumbar spine. No acute bone abnormality noted. IMPRESSION: Cholelithiasis without CT evidence of acute cholecystitis. Dilation of the extrahepatic bile duct is nonspecific. Correlation with liver enzymes would be helpful to exclude an obstructive process such is ampullary stenosis or potentially debris within the common duct. If abnormal, this could be better assessed with ERCP or MRCP examination. Aortic Atherosclerosis (ICD10-I70.0). Electronically Signed   By: Helyn Numbers MD   On: 06/30/2020 21:54   MR 3D Recon At Scanner  Result Date: 07/01/2020 CLINICAL DATA:  Cholelithiasis with dilated biliary tree EXAM: MRI ABDOMEN WITHOUT AND WITH CONTRAST (INCLUDING MRCP) TECHNIQUE: Multiplanar multisequence MR imaging of the abdomen was performed both before and after the administration of intravenous contrast. Heavily T2-weighted images of the biliary and pancreatic ducts were obtained, and three-dimensional MRCP images were rendered by post processing. CONTRAST:  35mL GADAVIST GADOBUTROL 1 MMOL/ML IV SOLN COMPARISON:  06/30/2020 CT evaluation FINDINGS: Lower chest: Incidental imaging of the lung bases with limited assessment on MRI. No pleural effusion. Hepatobiliary: Cholelithiasis.  No  pericholecystic stranding. Biliary duct distension is similar  to the previous exam measuring approximately 8 mm in terms of extrahepatic biliary duct dilation. Mild intrahepatic biliary duct distension as well. Suggestion of either meniscus sign or slightly muted tapering of the distal common bile duct. The MRCP images are limited secondary to motion. No filling defect to suggest biliary calculus in the biliary tree. Hepatic steatosis, moderate to marked without focal, suspicious hepatic lesion. Pancreas: Trace pancreatic ductal dilation without divisum. No peripancreatic inflammation. Fatty atrophy of much of the pancreas without focal lesion. Spleen:  Spleen normal in size and contour without focal lesion. Adrenals/Urinary Tract: Adrenal glands are normal. Kidneys enhance symmetrically. Small cyst in the anterior RIGHT kidney. Stomach/Bowel: Gastrointestinal tract without acute process to the extent evaluated. Vascular/Lymphatic: No aneurysmal dilation of the abdominal aorta. No adenopathy. Other:  None. Musculoskeletal: No suspicious bone lesions identified. IMPRESSION: 1. Biliary duct distension is similar to the previous exam. MRCP sequences mildly limited due to respiratory motion. Tapering of the common bile duct is slightly more abrupt than expected for stricture though this could still represent stricture or sphincter of Oddi dysfunction. Small stone in this area or lesion could also have this appearance. Consider short interval follow-up if no intervention is planned, at 3 month interval or based on clinical symptoms. 2. Cholelithiasis. No evidence of acute cholecystitis. 3. Hepatic steatosis, moderate to marked without focal, suspicious hepatic lesion. Electronically Signed   By: Donzetta Kohut M.D.   On: 07/01/2020 13:51   MR ABDOMEN MRCP W WO CONTAST  Result Date: 07/01/2020 CLINICAL DATA:  Cholelithiasis with dilated biliary tree EXAM: MRI ABDOMEN WITHOUT AND WITH CONTRAST (INCLUDING MRCP)  TECHNIQUE: Multiplanar multisequence MR imaging of the abdomen was performed both before and after the administration of intravenous contrast. Heavily T2-weighted images of the biliary and pancreatic ducts were obtained, and three-dimensional MRCP images were rendered by post processing. CONTRAST:  58mL GADAVIST GADOBUTROL 1 MMOL/ML IV SOLN COMPARISON:  06/30/2020 CT evaluation FINDINGS: Lower chest: Incidental imaging of the lung bases with limited assessment on MRI. No pleural effusion. Hepatobiliary: Cholelithiasis.  No pericholecystic stranding. Biliary duct distension is similar to the previous exam measuring approximately 8 mm in terms of extrahepatic biliary duct dilation. Mild intrahepatic biliary duct distension as well. Suggestion of either meniscus sign or slightly muted tapering of the distal common bile duct. The MRCP images are limited secondary to motion. No filling defect to suggest biliary calculus in the biliary tree. Hepatic steatosis, moderate to marked without focal, suspicious hepatic lesion. Pancreas: Trace pancreatic ductal dilation without divisum. No peripancreatic inflammation. Fatty atrophy of much of the pancreas without focal lesion. Spleen:  Spleen normal in size and contour without focal lesion. Adrenals/Urinary Tract: Adrenal glands are normal. Kidneys enhance symmetrically. Small cyst in the anterior RIGHT kidney. Stomach/Bowel: Gastrointestinal tract without acute process to the extent evaluated. Vascular/Lymphatic: No aneurysmal dilation of the abdominal aorta. No adenopathy. Other:  None. Musculoskeletal: No suspicious bone lesions identified. IMPRESSION: 1. Biliary duct distension is similar to the previous exam. MRCP sequences mildly limited due to respiratory motion. Tapering of the common bile duct is slightly more abrupt than expected for stricture though this could still represent stricture or sphincter of Oddi dysfunction. Small stone in this area or lesion could also have  this appearance. Consider short interval follow-up if no intervention is planned, at 3 month interval or based on clinical symptoms. 2. Cholelithiasis. No evidence of acute cholecystitis. 3. Hepatic steatosis, moderate to marked without focal, suspicious hepatic lesion. Electronically Signed   By: Juliene Pina  Wile M.D.   On: 07/01/2020 13:51   US Abdomen Limited RUQ  Result Date: 07/01/2020 CLINICAL DATA:  Questionable lesion common bile duct on recent CT EXAM: ULTRASOUND ABDOMEN LIMITED RIGHT UPPER QUADRANT COMPARISON:  CT abdomen and pelvis June 30, 2020 FINDINGS: Gallbladder: Within the gallbladder, there are echogenic foci which move and shadow consistent with cholelithiasis. Largest gallstone measures 2.0 cm in length. There is no gallbladder wall thickening or pericholecystic fluid. No sonographic Murphy sign noted by sonographer. Common bile duct: Diameter: There is prominence of the common bile duct measuring up to 10 mm in the mid to distal aspect. Note that a portion of the distal most aspect of common bile duct is obscured by gas. There is no intrahepatic biliary duct dilatation appreciable on this study. Liver: No focal lesion identified. Liver echogenicity is increased diffusely. Portal vein is patent on color Doppler imaging with normal direction of blood flow towards the liver. Other: None. IMPRESSION: 1. There is extrahepatic biliary duct dilatation. An obstructing lesion is not seen in the visualized portions of the common bile duct. Note, however, that a portion of the distal common bile duct is obscured by gas. Given the CT appearance and in the lack of visualization of the distal most aspect of the common bile duct on this study, correlation with MRCP may well be advised to further evaluate for potential distal obstructing biliary ductal lesion. 2. Cholelithiasis. No gallbladder wall thickening or pericholecystic fluid. 3. Diffuse increase in liver echogenicity, a finding most likely due to  hepatic steatosis. No focal liver lesions are evident on this study; it must be cautioned that the sensitivity of ultrasound for detection of focal liver lesions is diminished significantly in this circumstance. Electronically Signed   By: Bretta Bang III M.D.   On: 07/01/2020 09:37    Anti-infectives: Anti-infectives (From admission, onward)   None      Assessment/Plan: Impression: MRCP negative for choledocholithiasis.  Patient has no symptoms of biliary colic at this time. Plan: Will get HIDA scan today.  Further management is pending those results.  Patient is at higher risk for surgical intervention, thus would like to try to avoid cholecystectomy.  LOS: 1 day    [name] 07/02/2020

## 2020-07-02 NOTE — Discharge Summary (Signed)
Physician Discharge Summary  [name]:[id number] DOB: 11-Dec-1950 DOA: 06/30/2020  PCP: Cameron Specking, MD  Admit date: 06/30/2020 Discharge date: 07/03/2020  Time spent: 35 minutes  Recommendations for Outpatient Follow-up:  1. Repeat basic metabolic panel to follow across renal function 2. Reassess symptoms resolution and further improvement for follow-up visit.  Continue assisting with control of obesity. 3. Reassess blood pressure and adjust antihypertensive regimen as needed.   Discharge Diagnoses:  Active Problems:   Cholelithiasis   Epigastric pain   Non-intractable vomiting   Obesity, Class III, BMI 40-49.9 (morbid obesity) (HCC)   Essential hypertension   Discharge Condition: Stable and improved.  Discharged back to assisted living facility for further care and rehabilitation.  Follow-up with PCP in 2 weeks.  CODE STATUS: Full code.  Diet recommendation: Heart healthy/low-fat diet.  Filed Weights   06/30/20 1951 07/01/20 0514  Weight: (!) 137.4 kg (!) 134.8 kg    History of present illness:  As per H&P written by Dr. Georgeann [name] on 06/30/20 Cameron A Combsis a 69 y.o.malewith medical history significant ofGERD, depression, morbid obesity, chronic pain, diffuse idiopathic skeletal hyperostosis who presents to the ED for evaluation of abdominal pain. Patient reports abdominal pain every day for a week. Epigastric pain occurred today. The patient was previously at [facility] ED earlier today and was given nausea medications which initially helped and the patient was discharged from the emergency department. However symptoms are back and the patient presented to [name], ED  Patient does have a history of gallstones for which he was diagnosed several years ago via ultrasound however at that time they were not bothersome. On presentation today ultrasound could not be performed due to lack of ability ability however abdominal CT was performed that did demonstrate  cholelithiasis without radiographic evidence of acute cholecystitis. Lab work was significant for mild leukocytosis of 12.6 with neutrophilic predominance. Remainder of laboratory work-up overall unrevealing.  The patient did endorse severe abdominal pain was given pain control and antiemetics with good result. On my evaluation patient is resting comfortably. He is in no visible distress. ER physician spoke with covering surgery Dr. [name] requested medicine admission for completion of abdominal ultrasound and general surgery consult in the morning.   ED Course:Initial vital signs are reassuring. Patient was given intravenous morphine and Reglan for pain and nausea control. Abdominal CT completed, results as above. Hospitalist contacted for admission  Hospital Course:  1-cholelithiasis with concern for biliary colic -No fever, stable WBCs and currently not complaining of any pain. -Patient also denies nausea and vomiting. -CT scan confirming cholelithiasis without signs of cholecystitis; there was concern for biliary duct dilatation. -Patient HIDA scan and MRCP has been negative for choledocholithiasis and significant abnormalities of gallbladder dysfunction. -At this moment decision has been made to continue conservative management only and to follow-up on as-needed basis with general surgery as an outpatient. -No surgical intervention recommended at this time.  2-gastroesophageal reflux disease -Continue PPI. -Patient has been transitioned to Protonix every 12 hours and Pepcid nightly in order to provide better management of his symptoms. -Lifestyle changes discussed and encouraged.  3-diffuse skeletal hyperstosis and chronic pain -Continue as needed analgesics and supportive care. -Patient will return to ALF for further care and and assistance.  4-hypertension -Continue current antihypertensive regimen -Blood pressure stable and well-controlled. -Heart healthy diet  has been encouraged.  5-depression/insomnia -No suicidal ideation or hallucination: -Continue the use of Cymbalta, as needed alprazolam and nightly trazodone.  6-morbid obesity -Low calorie  diet and portion control has been discussed with patient -Body mass index is 45.19 kg/m.   Procedures: See below for x-ray reports  Consultations:  General surgery  Discharge Exam: Vitals:   07/02/20 1108 07/02/20 1458  BP: (!) 117/56   Pulse: 99 (!) 114  Resp: 20 16  Temp: 98.6 F (37 C) 98.3 F (36.8 C)  SpO2: 96% 93%   General exam: Appears calm and currently complaining mild midepigastric discomfort and intermittent nausea.  No further vomiting and requesting something to drink.  Patient is afebrile. Respiratory system: Clear to auscultation. Respiratory effort normal. Cardiovascular system: S1 & S2 heard, RRR. No JVD, murmurs, rubs, gallops or clicks.  Gastrointestinal system: Abdomen is obese, nondistended, soft and mildly tender to palpation in his right upper quadrant and midepigastric area. No organomegaly or masses felt. Normal bowel sounds heard. Central nervous system: Alert and oriented. No focal neurological deficits. Extremities: No cyanosis or clubbing. Skin: No rashes, no petechiae. Psychiatry: Judgement and insight appear normal. Mood & affect appropriate.    Discharge Instructions   Discharge Instructions    Diet - low sodium heart healthy   Complete by: As directed    Discharge instructions   Complete by: As directed    Maintain adequate hydration Follow heart healthy/low calorie and low fat diet. Take medications as prescribed. Follow-up with PCP in 2 weeks. Follow-up with general surgery as needed (especially if symptoms of biliary colic returns).   Increase activity slowly   Complete by: As directed      Allergies as of 07/02/2020      Reactions   Ceclor [cefaclor] Other (See Comments)   Keeps patient wake for days.    Depakote [divalproex  Sodium]    Stomach upset   Nsaids Other (See Comments)   G.I. Upset   Other Other (See Comments)   Steroids make patient extremely agitated       Medication List    STOP taking these medications   lansoprazole 30 MG capsule Commonly known as: PREVACID     TAKE these medications   acetaminophen 650 MG CR tablet Commonly known as: TYLENOL Take 650 mg by mouth every 8 (eight) hours as needed for pain.   ALPRAZolam 1 MG tablet Commonly known as: XANAX Take 1 tablet (1 mg total) by mouth every 12 (twelve) hours as needed for anxiety or sleep. What changed:   when to take this  reasons to take this   cetirizine 10 MG tablet Commonly known as: ZYRTEC Take 10 mg by mouth daily.   DULoxetine HCl 30 MG Csdr Take 30 mg by mouth 2 (two) times daily.   famotidine 20 MG tablet Commonly known as: Pepcid Take 1 tablet (20 mg total) by mouth at bedtime.   furosemide 80 MG tablet Commonly known as: LASIX Take 80 mg by mouth daily.   lisinopril 10 MG tablet Commonly known as: ZESTRIL Take 10 mg by mouth daily.   ondansetron 8 MG tablet Commonly known as: ZOFRAN Take 0.5 tablets (4 mg total) by mouth every 8 (eight) hours as needed for nausea or vomiting. What changed: medication strength   oxycodone 5 MG capsule Commonly known as: OXY-IR Take 1 capsule (5 mg total) by mouth in the morning, at noon, and at bedtime.   pantoprazole 40 MG tablet Commonly known as: PROTONIX Take 1 tablet (40 mg total) by mouth 2 (two) times daily.   potassium chloride 10 MEQ tablet Commonly known as: KLOR-CON Take 10 mEq by mouth  daily.   tiZANidine 4 MG tablet Commonly known as: ZANAFLEX Take 4 mg by mouth in the morning and at bedtime.   traZODone 100 MG tablet Commonly known as: DESYREL Take 100 mg by mouth at bedtime.   triamcinolone cream 0.1 % Commonly known as: KENALOG Apply 1 application topically 3 (three) times daily as needed.      Allergies  Allergen Reactions  .  Ceclor [Cefaclor] Other (See Comments)    Keeps patient wake for days.   . Depakote [Divalproex Sodium]     Stomach upset  . Nsaids Other (See Comments)    G.I. Upset  . Other Other (See Comments)    Steroids make patient extremely agitated     Follow-up Information    Vyas, Dhruv B, MD. Schedule an appointment as soon as possible for a visit in 2 week(s).   Specialty: Internal Medicine Contact information: [address] [place] [address] [phone]                The results of significant diagnostics from this hospitalization (including imaging, microbiology, ancillary and laboratory) are listed below for reference.    Significant Diagnostic Studies: CT ABDOMEN PELVIS W CONTRAST  Result Date: 06/30/2020 CLINICAL DATA:  Epigastric abdominal pain EXAM: CT ABDOMEN AND PELVIS WITH CONTRAST TECHNIQUE: Multidetector CT imaging of the abdomen and pelvis was performed using the standard protocol following bolus administration of intravenous contrast. CONTRAST:  OMNIPAQUE IOHEXOL 300 MG/ML  SOLN COMPARISON:  None. FINDINGS: Lower chest: The visualized lung bases are clear. Right gynecomastia noted. Visualized heart and pericardium are unremarkable. Hepatobiliary: Cholelithiasis without CT evidence of acute cholecystitis. The extrahepatic bile duct is dilated, measuring 11 mm in diameter, best seen on coronal image # 64/6. A a distal obstructing lesion is not clearly identified. No intrahepatic biliary ductal dilation. The liver is unremarkable. Pancreas: Unremarkable Spleen: Unremarkable Adrenals/Urinary Tract: The adrenal glands and kidneys are unremarkable. The bladder is mildly distended, but is otherwise unremarkable. Stomach/Bowel: The large and small bowel, stomach, and appendix are unremarkable. No free intraperitoneal gas or fluid. There is mild wispy mesenteric infiltration at the mesenteric root, nonspecific. Vascular/Lymphatic: Minimal atherosclerotic calcification within the  abdominal aorta. The abdominal vasculature is otherwise unremarkable. No pathologic adenopathy within the abdomen and pelvis. Reproductive: Unremarkable Other: Small left fat containing inguinal hernia is present. Musculoskeletal: Advanced degenerative changes are seen within the lumbar spine. No acute bone abnormality noted. IMPRESSION: Cholelithiasis without CT evidence of acute cholecystitis. Dilation of the extrahepatic bile duct is nonspecific. Correlation with liver enzymes would be helpful to exclude an obstructive process such is ampullary stenosis or potentially debris within the common duct. If abnormal, this could be better assessed with ERCP or MRCP examination. Aortic Atherosclerosis (ICD10-I70.0). Electronically Signed   By: Helyn Numbers MD   On: 06/30/2020 21:54   MR 3D Recon At Scanner  Result Date: 07/01/2020 CLINICAL DATA:  Cholelithiasis with dilated biliary tree EXAM: MRI ABDOMEN WITHOUT AND WITH CONTRAST (INCLUDING MRCP) TECHNIQUE: Multiplanar multisequence MR imaging of the abdomen was performed both before and after the administration of intravenous contrast. Heavily T2-weighted images of the biliary and pancreatic ducts were obtained, and three-dimensional MRCP images were rendered by post processing. CONTRAST:  10mL GADAVIST GADOBUTROL 1 MMOL/ML IV SOLN COMPARISON:  06/30/2020 CT evaluation FINDINGS: Lower chest: Incidental imaging of the lung bases with limited assessment on MRI. No pleural effusion. Hepatobiliary: Cholelithiasis.  No pericholecystic stranding. Biliary duct distension is similar to the previous exam measuring approximately  8 mm in terms of extrahepatic biliary duct dilation. Mild intrahepatic biliary duct distension as well. Suggestion of either meniscus sign or slightly muted tapering of the distal common bile duct. The MRCP images are limited secondary to motion. No filling defect to suggest biliary calculus in the biliary tree. Hepatic steatosis, moderate to marked  without focal, suspicious hepatic lesion. Pancreas: Trace pancreatic ductal dilation without divisum. No peripancreatic inflammation. Fatty atrophy of much of the pancreas without focal lesion. Spleen:  Spleen normal in size and contour without focal lesion. Adrenals/Urinary Tract: Adrenal glands are normal. Kidneys enhance symmetrically. Small cyst in the anterior RIGHT kidney. Stomach/Bowel: Gastrointestinal tract without acute process to the extent evaluated. Vascular/Lymphatic: No aneurysmal dilation of the abdominal aorta. No adenopathy. Other:  None. Musculoskeletal: No suspicious bone lesions identified. IMPRESSION: 1. Biliary duct distension is similar to the previous exam. MRCP sequences mildly limited due to respiratory motion. Tapering of the common bile duct is slightly more abrupt than expected for stricture though this could still represent stricture or sphincter of Oddi dysfunction. Small stone in this area or lesion could also have this appearance. Consider short interval follow-up if no intervention is planned, at 3 month interval or based on clinical symptoms. 2. Cholelithiasis. No evidence of acute cholecystitis. 3. Hepatic steatosis, moderate to marked without focal, suspicious hepatic lesion. Electronically Signed   By: Donzetta Kohut M.D.   On: 07/01/2020 13:51   NM Hepato W/EF  Result Date: 07/02/2020 CLINICAL DATA:  Abdominal pain and nausea EXAM: NUCLEAR MEDICINE HEPATOBILIARY IMAGING WITH GALLBLADDER EF VIEWS: Anterior right upper quadrant RADIOPHARMACEUTICALS:  4.98 mCi Tc-80m Choletec IV COMPARISON:  None. FINDINGS: Liver uptake of radiotracer is unremarkable. There is prompt visualization of gallbladder and small bowel, indicating patency of the cystic and common bile ducts. A weight based dose, 2.73 mcg, of CCK was administered intravenously with calculation of the computer generated ejection fraction of radiotracer from the gallbladder. The patient experienced mild abdominal pain  with CCK administration. The computer generated ejection fraction of radiotracer from the gallbladder is normal at 99%, normal greater than 40%. IMPRESSION: Normal ejection fraction of radiotracer from the gallbladder. Patient experienced mild abdominal pain with the CCK administration. Cystic and common bile ducts are patent as is evidenced by visualization of gallbladder and small bowel. Electronically Signed   By: Bretta Bang III M.D.   On: 07/02/2020 11:07   MR ABDOMEN MRCP W WO CONTAST  Result Date: 07/01/2020 CLINICAL DATA:  Cholelithiasis with dilated biliary tree EXAM: MRI ABDOMEN WITHOUT AND WITH CONTRAST (INCLUDING MRCP) TECHNIQUE: Multiplanar multisequence MR imaging of the abdomen was performed both before and after the administration of intravenous contrast. Heavily T2-weighted images of the biliary and pancreatic ducts were obtained, and three-dimensional MRCP images were rendered by post processing. CONTRAST:  10mL GADAVIST GADOBUTROL 1 MMOL/ML IV SOLN COMPARISON:  06/30/2020 CT evaluation FINDINGS: Lower chest: Incidental imaging of the lung bases with limited assessment on MRI. No pleural effusion. Hepatobiliary: Cholelithiasis.  No pericholecystic stranding. Biliary duct distension is similar to the previous exam measuring approximately 8 mm in terms of extrahepatic biliary duct dilation. Mild intrahepatic biliary duct distension as well. Suggestion of either meniscus sign or slightly muted tapering of the distal common bile duct. The MRCP images are limited secondary to motion. No filling defect to suggest biliary calculus in the biliary tree. Hepatic steatosis, moderate to marked without focal, suspicious hepatic lesion. Pancreas: Trace pancreatic ductal dilation without divisum. No peripancreatic inflammation. Fatty atrophy of much of  the pancreas without focal lesion. Spleen:  Spleen normal in size and contour without focal lesion. Adrenals/Urinary Tract: Adrenal glands are normal.  Kidneys enhance symmetrically. Small cyst in the anterior RIGHT kidney. Stomach/Bowel: Gastrointestinal tract without acute process to the extent evaluated. Vascular/Lymphatic: No aneurysmal dilation of the abdominal aorta. No adenopathy. Other:  None. Musculoskeletal: No suspicious bone lesions identified. IMPRESSION: 1. Biliary duct distension is similar to the previous exam. MRCP sequences mildly limited due to respiratory motion. Tapering of the common bile duct is slightly more abrupt than expected for stricture though this could still represent stricture or sphincter of Oddi dysfunction. Small stone in this area or lesion could also have this appearance. Consider short interval follow-up if no intervention is planned, at 3 month interval or based on clinical symptoms. 2. Cholelithiasis. No evidence of acute cholecystitis. 3. Hepatic steatosis, moderate to marked without focal, suspicious hepatic lesion. Electronically Signed   By: Donzetta Kohut M.D.   On: 07/01/2020 13:51   US Abdomen Limited RUQ  Result Date: 07/01/2020 CLINICAL DATA:  Questionable lesion common bile duct on recent CT EXAM: ULTRASOUND ABDOMEN LIMITED RIGHT UPPER QUADRANT COMPARISON:  CT abdomen and pelvis June 30, 2020 FINDINGS: Gallbladder: Within the gallbladder, there are echogenic foci which move and shadow consistent with cholelithiasis. Largest gallstone measures 2.0 cm in length. There is no gallbladder wall thickening or pericholecystic fluid. No sonographic Murphy sign noted by sonographer. Common bile duct: Diameter: There is prominence of the common bile duct measuring up to 10 mm in the mid to distal aspect. Note that a portion of the distal most aspect of common bile duct is obscured by gas. There is no intrahepatic biliary duct dilatation appreciable on this study. Liver: No focal lesion identified. Liver echogenicity is increased diffusely. Portal vein is patent on color Doppler imaging with normal direction of blood flow  towards the liver. Other: None. IMPRESSION: 1. There is extrahepatic biliary duct dilatation. An obstructing lesion is not seen in the visualized portions of the common bile duct. Note, however, that a portion of the distal common bile duct is obscured by gas. Given the CT appearance and in the lack of visualization of the distal most aspect of the common bile duct on this study, correlation with MRCP may well be advised to further evaluate for potential distal obstructing biliary ductal lesion. 2. Cholelithiasis. No gallbladder wall thickening or pericholecystic fluid. 3. Diffuse increase in liver echogenicity, a finding most likely due to hepatic steatosis. No focal liver lesions are evident on this study; it must be cautioned that the sensitivity of ultrasound for detection of focal liver lesions is diminished significantly in this circumstance. Electronically Signed   By: Bretta Bang III M.D.   On: 07/01/2020 09:37    Microbiology: Recent Results (from the past 240 hour(s))  SARS Coronavirus 2 by RT PCR (hospital order, performed in [facility] hospital lab) Nasopharyngeal Nasopharyngeal Swab     Status: None   Collection Time: 06/30/20 10:33 PM   Specimen: Nasopharyngeal Swab  Result Value Ref Range Status   SARS Coronavirus 2 NEGATIVE NEGATIVE Final    Comment: (NOTE) SARS-CoV-2 target nucleic acids are NOT DETECTED.  The SARS-CoV-2 RNA is generally detectable in upper and lower respiratory specimens during the acute phase of infection. The lowest concentration of SARS-CoV-2 viral copies this assay can detect is 250 copies / mL. A negative result does not preclude SARS-CoV-2 infection and should not be used as the sole basis for treatment or other  patient management decisions.  A negative result may occur with improper specimen collection / handling, submission of specimen other than nasopharyngeal swab, presence of viral mutation(s) within the areas targeted by this assay, and  inadequate number of viral copies (<250 copies / mL). A negative result must be combined with clinical observations, patient history, and epidemiological information.  Fact Sheet for Patients:   BoilerBrush.com.cy  Fact Sheet for Healthcare Providers: https://pope.com/  This test is not yet approved or  cleared by the [place] FDA and has been authorized for detection and/or diagnosis of SARS-CoV-2 by FDA under an Emergency Use Authorization (EUA).  This EUA will remain in effect (meaning this test can be used) for the duration of the COVID-19 declaration under Section 564(b)(1) of the Act, 21 U.S.C. section 360bbb-3(b)(1), unless the authorization is terminated or revoked sooner.  Performed at [facility], [address], [place], [place] [address]      Labs: Basic Metabolic Panel: Recent Labs  Lab 06/30/20 2006 07/01/20 0321  NA 134* 136  K 3.6 4.7  CL 95* 97*  CO2 27 29  GLUCOSE 119* 100*  BUN 19 17  CREATININE 1.07 1.06  CALCIUM 8.7* 8.7*   Liver Function Tests: Recent Labs  Lab 06/30/20 2006 07/01/20 0321  AST 29 21  ALT 35 31  ALKPHOS 61 54  BILITOT 0.5 0.4  PROT 7.6 7.0  ALBUMIN 4.0 3.7   Recent Labs  Lab 06/30/20 2006  LIPASE 30   CBC: Recent Labs  Lab 06/30/20 2006 07/01/20 0321  WBC 12.6* 11.6*  NEUTROABS 9.9*  --   HGB 13.4 12.7*  HCT 42.0 39.8  MCV 84.5 86.0  PLT PLATELET CLUMPS NOTED ON SMEAR, COUNT APPEARS ADEQUATE 237    Signed:  Vassie Loll MD.  [facility] 07/02/2020, 3:28 PM

## 2020-07-02 NOTE — NC FL2 (Signed)
[place] MEDICAID FL2 LEVEL OF CARE SCREENING TOOL     IDENTIFICATION  Patient Name: [name] Birthdate: [birth year] Sex: male Admission Date (Current Location): 06/30/2020  [facility] and [place] Number:  [employer] and Address:  [facility],  [address] [place] [address]      Provider Number: [id number]  Attending Physician Name and Address:  Vassie Loll, MD  Relative Name and Phone Number:  Dymond Spreen, [phone]    Current Level of Care: Hospital Recommended Level of Care: Assisted Living Facility Prior Approval Number:    Date Approved/Denied:   PASRR Number: [id number]  Discharge Plan: Domiciliary (Rest home)    Current Diagnoses: Patient Active Problem List   Diagnosis Date Noted   Epigastric pain    Non-intractable vomiting    Obesity, Class III, BMI 40-49.9 (morbid obesity) (HCC)    Essential hypertension    Cholelithiasis 06/30/2020   Peripheral neuropathy 05/13/2020   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   Gait abnormality 08/29/2019   Rhabdomyolysis 04/19/2019   AKI (acute kidney injury) (HCC) 04/19/2019   Hypocalcemia 04/19/2019   Leukocytosis 04/19/2019   Elevated LFTs 04/19/2019   DISH (diffuse idiopathic skeletal hyperostosis)    GERD (gastroesophageal reflux disease)    Impaired hearing    Chronic pain     Orientation RESPIRATION BLADDER Height & Weight     Self, Time, Situation, Place  Normal Continent Weight: (!) 134.8 kg Height:  5\' 8"  (172.7 cm)  BEHAVIORAL SYMPTOMS/MOOD NEUROLOGICAL BOWEL NUTRITION STATUS      Continent Diet (Regular)  AMBULATORY STATUS COMMUNICATION OF NEEDS Skin   Extensive Assist Verbally Normal                       Personal Care Assistance Level of Assistance  Bathing, Dressing, Feeding Bathing Assistance: Limited assistance Feeding assistance: Independent Dressing Assistance: Limited assistance     Functional Limitations Info  Sight, Hearing, Speech  Sight Info: Adequate Hearing Info: Impaired Speech Info: Adequate    SPECIAL CARE FACTORS FREQUENCY  PT (By licensed PT), OT (By licensed OT)     PT Frequency: 5 times a week OT Frequency: 5 times a week            Contractures Contractures Info: Not present    Additional Factors Info  Code Status, Allergies Code Status Info: Full Allergies Info: Ceclor,depakote, nsaids, steroids           Current Medications (07/02/2020):  This is the current hospital active medication list Current Facility-Administered Medications  Medication Dose Route Frequency Provider Last Rate Last Admin   0.9 %  sodium chloride infusion   Intravenous Continuous 07/04/2020 B, MD 75 mL/hr at 07/02/20 1435 New Bag at 07/02/20 1435   acetaminophen (TYLENOL) tablet 650 mg  650 mg Oral Q6H PRN 07/04/20 B, MD   650 mg at 07/01/20 0935   Or   acetaminophen (TYLENOL) suppository 650 mg  650 mg Rectal Q6H PRN Sreenath, Sudheer B, MD       albuterol (PROVENTIL) (2.5 MG/3ML) 0.083% nebulizer solution 2.5 mg  2.5 mg Nebulization Q2H PRN Sreenath, Sudheer B, MD       DULoxetine (CYMBALTA) DR capsule 30 mg  30 mg Oral BID 07/03/20, Sudheer B, MD   30 mg at 07/02/20 1217   enoxaparin (LOVENOX) injection 60 mg  60 mg Subcutaneous Q24H 07/04/20 B, MD   60 mg at 07/02/20 1110   hydrALAZINE (APRESOLINE) injection 10 mg  10 mg Intravenous Q4H PRN Lolita Patella B, MD       lisinopril (ZESTRIL) tablet 10 mg  10 mg Oral Daily [name], Sudheer B, MD   10 mg at 07/02/20 1218   loratadine (CLARITIN) tablet 10 mg  10 mg Oral Daily [name], Sudheer B, MD   10 mg at 07/02/20 1218   morphine 2 MG/ML injection 2 mg  2 mg Intravenous Q3H PRN Lolita Patella B, MD   2 mg at 07/02/20 1454   ondansetron (ZOFRAN) tablet 4 mg  4 mg Oral Q6H PRN Lolita Patella B, MD   4 mg at 07/01/20 0133   Or   ondansetron (ZOFRAN) injection 4 mg  4 mg Intravenous Q6H PRN Lolita Patella B, MD        oxyCODONE (Oxy IR/ROXICODONE) immediate release tablet 5 mg  5 mg Oral Q4H PRN Lolita Patella B, MD   5 mg at 07/02/20 1201   pantoprazole (PROTONIX) EC tablet 40 mg  40 mg Oral BID Lolita Patella B, MD   40 mg at 07/02/20 1219   sterile water (preservative free) injection            temazepam (RESTORIL) capsule 15 mg  15 mg Oral QHS PRN Lolita Patella B, MD   15 mg at 07/01/20 2300   traZODone (DESYREL) tablet 25 mg  25 mg Oral QHS PRN Lolita Patella B, MD   25 mg at 06/30/20 2340      Discharge Medications:    Medication List    STOP taking these medications   lansoprazole 30 MG capsule Commonly known as: PREVACID     TAKE these medications   acetaminophen 650 MG CR tablet Commonly known as: TYLENOL Take 650 mg by mouth every 8 (eight) hours as needed for pain.   ALPRAZolam 1 MG tablet Commonly known as: XANAX Take 1 tablet (1 mg total) by mouth every 12 (twelve) hours as needed for anxiety or sleep. What changed:   when to take this  reasons to take this   cetirizine 10 MG tablet Commonly known as: ZYRTEC Take 10 mg by mouth daily.   DULoxetine HCl 30 MG Csdr Take 30 mg by mouth 2 (two) times daily.   famotidine 20 MG tablet Commonly known as: Pepcid Take 1 tablet (20 mg total) by mouth at bedtime.   furosemide 80 MG tablet Commonly known as: LASIX Take 80 mg by mouth daily.   lisinopril 10 MG tablet Commonly known as: ZESTRIL Take 10 mg by mouth daily.   ondansetron 8 MG tablet Commonly known as: ZOFRAN Take 0.5 tablets (4 mg total) by mouth every 8 (eight) hours as needed for nausea or vomiting. What changed: medication strength   oxycodone 5 MG capsule Commonly known as: OXY-IR Take 1 capsule (5 mg total) by mouth in the morning, at noon, and at bedtime.   pantoprazole 40 MG tablet Commonly known as: PROTONIX Take 1 tablet (40 mg total) by mouth 2 (two) times daily.   potassium chloride 10 MEQ tablet Commonly known  as: KLOR-CON Take 10 mEq by mouth daily.   tiZANidine 4 MG tablet Commonly known as: ZANAFLEX Take 4 mg by mouth in the morning and at bedtime.   traZODone 100 MG tablet Commonly known as: DESYREL Take 100 mg by mouth at bedtime.   triamcinolone cream 0.1 % Commonly known as: KENALOG Apply 1 application topically 3 (three) times daily as needed.     Relevant Imaging Results:  Relevant Lab Results:  Additional Information SS# [id number]  Leitha Bleak, RN

## 2020-07-02 NOTE — TOC Progression Note (Signed)
Transition of Care [facility]) - Progression Note    Patient Details  Name: [name] MRN: [id number] Date of Birth: [birth year]  Transition [facility]) CM/SW Contact  Leitha Bleak, RN Phone Number: 07/02/2020, 3:25 PM  Clinical Narrative:   Patient is medically ready for discharge. Patient is from [facility] in [place].  Patient states that his brother is taking him to [facility] in [place] tomorrow. TOC called [facility] to confirm. Patient has been accepted to [facility] in [place] with admission date for 07/03/20. TOC spoke with Sharia Reeve [phone], and faxed clinicals in the hub. Sharia Reeve states he will call brother POA to arrange transportation for morning discharge. RN and MD updated.     Expected Discharge Plan: Assisted Living Barriers to Discharge: Other (comment) (Moving to another ALF, Admission date can not be before 7/28)  Expected Discharge Plan and Services Expected Discharge Plan: Assisted Living       Living arrangements for the past 2 months: Assisted Living Facility Expected Discharge Date: 07/02/20                    Readmission Risk Interventions Readmission Risk Prevention Plan 04/25/2019  Post Dischage Appt Not Complete  Appt Comments Medical Director at SNF will follow  Transportation Screening Complete  Some recent data might be hidden

## 2020-07-02 NOTE — Progress Notes (Signed)
   07/02/20 1458  Assess: MEWS Score  Temp 98.3 F (36.8 C)  BP 90/68  Pulse Rate (!) 114  Resp 16  SpO2 93 %  O2 Device Room Air  Assess: MEWS Score  MEWS Temp 0  MEWS Systolic 1  MEWS Pulse 2  MEWS RR 0  MEWS LOC 0  MEWS Score 3  MEWS Score Color Yellow  Assess: if the MEWS score is Yellow or Red  Were vital signs taken at a resting state? Yes  Focused Assessment No change from prior assessment  Early Detection of Sepsis Score *See Row Information* Low  MEWS guidelines implemented *See Row Information* No, vital signs rechecked

## 2020-07-03 DIAGNOSIS — K8020 Calculus of gallbladder without cholecystitis without obstruction: Secondary | ICD-10-CM | POA: Diagnosis not present

## 2020-07-03 LAB — CBC WITH DIFFERENTIAL/PLATELET
Abs Immature Granulocytes: 0.03 10*3/uL (ref 0.00–0.07)
Basophils Absolute: 0 10*3/uL (ref 0.0–0.1)
Basophils Relative: 0 %
Eosinophils Absolute: 0.2 10*3/uL (ref 0.0–0.5)
Eosinophils Relative: 4 %
HCT: 38.5 % — ABNORMAL LOW (ref 39.0–52.0)
Hemoglobin: 12.2 g/dL — ABNORMAL LOW (ref 13.0–17.0)
Immature Granulocytes: 1 %
Lymphocytes Relative: 21 %
Lymphs Abs: 1.3 10*3/uL (ref 0.7–4.0)
MCH: 27.6 pg (ref 26.0–34.0)
MCHC: 31.7 g/dL (ref 30.0–36.0)
MCV: 87.1 fL (ref 80.0–100.0)
Monocytes Absolute: 0.5 10*3/uL (ref 0.1–1.0)
Monocytes Relative: 8 %
Neutro Abs: 4.1 10*3/uL (ref 1.7–7.7)
Neutrophils Relative %: 66 %
Platelets: 190 10*3/uL (ref 150–400)
RBC: 4.42 MIL/uL (ref 4.22–5.81)
RDW: 14.3 % (ref 11.5–15.5)
WBC: 6.2 10*3/uL (ref 4.0–10.5)
nRBC: 0 % (ref 0.0–0.2)

## 2020-07-03 LAB — COMPREHENSIVE METABOLIC PANEL
ALT: 29 U/L (ref 0–44)
AST: 22 U/L (ref 15–41)
Albumin: 3.3 g/dL — ABNORMAL LOW (ref 3.5–5.0)
Alkaline Phosphatase: 51 U/L (ref 38–126)
Anion gap: 8 (ref 5–15)
BUN: 11 mg/dL (ref 8–23)
CO2: 27 mmol/L (ref 22–32)
Calcium: 8.6 mg/dL — ABNORMAL LOW (ref 8.9–10.3)
Chloride: 102 mmol/L (ref 98–111)
Creatinine, Ser: 0.8 mg/dL (ref 0.61–1.24)
GFR calc Af Amer: >60 mL/min (ref >60)
GFR calc non Af Amer: >60 mL/min (ref >60)
Glucose, Bld: 101 mg/dL — ABNORMAL HIGH (ref 70–99)
Potassium: 3.8 mmol/L (ref 3.5–5.1)
Sodium: 137 mmol/L (ref 135–145)
Total Bilirubin: 0.5 mg/dL (ref 0.3–1.2)
Total Protein: 6.1 g/dL — ABNORMAL LOW (ref 6.5–8.1)

## 2020-07-03 LAB — MAGNESIUM: Magnesium: 1.9 mg/dL (ref 1.7–2.4)

## 2020-07-03 NOTE — NC FL2 (Signed)
[place] MEDICAID FL2 LEVEL OF CARE SCREENING TOOL     IDENTIFICATION  Patient Name: [name] Birthdate: Mar 10, 1951 Sex: male Admission Date (Current Location): 06/30/2020  [facility] and [place] Number:  [employer] and Address:  [facility],  [address] [place] [address]      Provider Number: [id number]  Attending Physician Name and Address:  No att. providers found  Relative Name and Phone Number:  Deddrick, Saindon [phone]    Current Level of Care: Hospital Recommended Level of Care: Assisted Living Facility Prior Approval Number:    Date Approved/Denied:   PASRR Number: [id number]  Discharge Plan: Domiciliary (Rest home)    Current Diagnoses: Patient Active Problem List   Diagnosis Date Noted  . Epigastric pain   . Non-intractable vomiting   . Obesity, Class III, BMI 40-49.9 (morbid obesity) (HCC)   . Essential hypertension   . Cholelithiasis 06/30/2020  . Peripheral neuropathy 05/13/2020  . 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 Gait abnormality 08/29/2019  . Rhabdomyolysis 04/19/2019  . AKI (acute kidney injury) (HCC) 04/19/2019  . Hypocalcemia 04/19/2019  . Leukocytosis 04/19/2019  . Elevated LFTs 04/19/2019  . DISH (diffuse idiopathic skeletal hyperostosis)   . GERD (gastroesophageal reflux disease)   . Impaired hearing   . Chronic pain     Orientation RESPIRATION BLADDER Height & Weight     Self, Time, Situation, Place  Normal Continent Weight: (!) 297 lb 2.9 oz (134.8 kg) Height:  5\' 8"  (172.7 cm)  BEHAVIORAL SYMPTOMS/MOOD NEUROLOGICAL BOWEL NUTRITION STATUS      Continent Diet (Regular)  AMBULATORY STATUS COMMUNICATION OF NEEDS Skin   Extensive Assist Verbally Normal                       Personal Care Assistance Level of Assistance  Bathing, Dressing, Feeding Bathing Assistance: Limited assistance Feeding assistance: Independent Dressing Assistance: Limited assistance     Functional Limitations Info   Sight, Hearing, Speech Sight Info: Adequate Hearing Info: Impaired Speech Info: Adequate    SPECIAL CARE FACTORS FREQUENCY  PT (By licensed PT), OT (By licensed OT)     PT Frequency: 5 times a week OT Frequency: 5 times a week            Contractures Contractures Info: Not present    Additional Factors Info  Code Status, Allergies Code Status Info: Full Allergies Info: Ceclor,depakote, nsaids, steroids           Current Medications (07/03/2020):  This is the current hospital active medication list Current Facility-Administered Medications  Medication Dose Route Frequency Provider Last Rate Last Admin  . 0.9 %  sodium chloride infusion   Intravenous Continuous 07/05/2020 B, MD 75 mL/hr at 07/03/20 0100 Rate Verify at 07/03/20 0100  . acetaminophen (TYLENOL) tablet 650 mg  650 mg Oral Q6H PRN 07/05/20 B, MD   650 mg at 07/01/20 0935   Or  . acetaminophen (TYLENOL) suppository 650 mg  650 mg Rectal Q6H PRN Sreenath, Sudheer B, MD      . albuterol (PROVENTIL) (2.5 MG/3ML) 0.083% nebulizer solution 2.5 mg  2.5 mg Nebulization Q2H PRN Sreenath, Sudheer B, MD      . DULoxetine (CYMBALTA) DR capsule 30 mg  30 mg Oral BID 07/03/20 B, MD   30 mg at 07/03/20 1024  . enoxaparin (LOVENOX) injection 60 mg  60 mg Subcutaneous Q24H 07/05/20 B, MD   60 mg at 07/03/20 1025  .  hydrALAZINE (APRESOLINE) injection 10 mg  10 mg Intravenous Q4H PRN Sreenath, Sudheer B, MD      . lisinopril (ZESTRIL) tablet 10 mg  10 mg Oral Daily [name], Sudheer B, MD   10 mg at 07/03/20 1024  . loratadine (CLARITIN) tablet 10 mg  10 mg Oral Daily Lolita Patella B, MD   10 mg at 07/03/20 1024  . morphine 2 MG/ML injection 2 mg  2 mg Intravenous Q3H PRN Lolita Patella B, MD   2 mg at 07/02/20 2037  . ondansetron (ZOFRAN) tablet 4 mg  4 mg Oral Q6H PRN Lolita Patella B, MD   4 mg at 07/01/20 0133   Or  . ondansetron (ZOFRAN) injection 4 mg  4 mg Intravenous Q6H PRN  Sreenath, Sudheer B, MD      . oxyCODONE (Oxy IR/ROXICODONE) immediate release tablet 5 mg  5 mg Oral Q4H PRN Lolita Patella B, MD   5 mg at 07/02/20 1642  . pantoprazole (PROTONIX) EC tablet 40 mg  40 mg Oral BID Lolita Patella B, MD   40 mg at 07/03/20 1024  . temazepam (RESTORIL) capsule 15 mg  15 mg Oral QHS PRN Lolita Patella B, MD   15 mg at 07/02/20 2235  . traZODone (DESYREL) tablet 25 mg  25 mg Oral QHS PRN Lolita Patella B, MD   25 mg at 06/30/20 2340   Current Outpatient Medications  Medication Sig Dispense Refill  . acetaminophen (TYLENOL) 650 MG CR tablet Take 650 mg by mouth every 8 (eight) hours as needed for pain.    . cetirizine (ZYRTEC) 10 MG tablet Take 10 mg by mouth daily.    . DULoxetine HCl 30 MG CSDR Take 30 mg by mouth 2 (two) times daily.     . furosemide (LASIX) 80 MG tablet Take 80 mg by mouth daily.    Marland Kitchen lisinopril (ZESTRIL) 10 MG tablet Take 10 mg by mouth daily.     . potassium chloride (KLOR-CON) 10 MEQ tablet Take 10 mEq by mouth daily.    Marland Kitchen tiZANidine (ZANAFLEX) 4 MG tablet Take 4 mg by mouth in the morning and at bedtime.    . traZODone (DESYREL) 100 MG tablet Take 100 mg by mouth at bedtime.    . triamcinolone cream (KENALOG) 0.1 % Apply 1 application topically 3 (three) times daily as needed.    . ALPRAZolam (XANAX) 1 MG tablet Take 1 tablet (1 mg total) by mouth every 12 (twelve) hours as needed for anxiety or sleep. 10 tablet 0  . famotidine (PEPCID) 20 MG tablet Take 1 tablet (20 mg total) by mouth at bedtime. 30 tablet 1  . ondansetron (ZOFRAN) 8 MG tablet Take 0.5 tablets (4 mg total) by mouth every 8 (eight) hours as needed for nausea or vomiting. 30 tablet 0  . oxycodone (OXY-IR) 5 MG capsule Take 1 capsule (5 mg total) by mouth in the morning, at noon, and at bedtime. 15 capsule 0  . pantoprazole (PROTONIX) 40 MG tablet Take 1 tablet (40 mg total) by mouth 2 (two) times daily. 60 tablet 1     Discharge Medications: STOP taking these  medications   lansoprazole 30 MG capsule Commonly known as: PREVACID     TAKE these medications   acetaminophen 650 MG CR tablet Commonly known as: TYLENOL Take 650 mg by mouth every 8 (eight) hours as needed for pain.   ALPRAZolam 1 MG tablet Commonly known as: XANAX Take 1 tablet (1 mg total) by  mouth every 12 (twelve) hours as needed for anxiety or sleep. What changed:   when to take this  reasons to take this   cetirizine 10 MG tablet Commonly known as: ZYRTEC Take 10 mg by mouth daily.   DULoxetine HCl 30 MG Csdr Take 30 mg by mouth 2 (two) times daily.   famotidine 20 MG tablet Commonly known as: Pepcid Take 1 tablet (20 mg total) by mouth at bedtime.   furosemide 80 MG tablet Commonly known as: LASIX Take 80 mg by mouth daily.   lisinopril 10 MG tablet Commonly known as: ZESTRIL Take 10 mg by mouth daily.   ondansetron 8 MG tablet Commonly known as: ZOFRAN Take 0.5 tablets (4 mg total) by mouth every 8 (eight) hours as needed for nausea or vomiting. What changed: medication strength   oxycodone 5 MG capsule Commonly known as: OXY-IR Take 1 capsule (5 mg total) by mouth in the morning, at noon, and at bedtime.   pantoprazole 40 MG tablet Commonly known as: PROTONIX Take 1 tablet (40 mg total) by mouth 2 (two) times daily.   potassium chloride 10 MEQ tablet Commonly known as: KLOR-CON Take 10 mEq by mouth daily.   tiZANidine 4 MG tablet Commonly known as: ZANAFLEX Take 4 mg by mouth in the morning and at bedtime.   traZODone 100 MG tablet Commonly known as: DESYREL Take 100 mg by mouth at bedtime.   triamcinolone cream 0.1 % Commonly known as: KENALOG Apply 1 application topically 3 (three) times daily as needed.     Relevant Imaging Results:  Relevant Lab Results:   Additional Information SS# [id number]  Barry Brunner, LCSW

## 2020-07-03 NOTE — Progress Notes (Signed)
Pt discharged via wheelchair to POV for travel to [place] [facility] [place]. All belongings in pt's possession.

## 2020-07-03 NOTE — Discharge Summary (Signed)
Physician Discharge Summary  [name]:[id number] DOB: [birth year] DOA: 06/30/2020  PCP: [name], MD  Admit date: 06/30/2020 Discharge date: 07/03/2020  Time spent: 31 minutes  Recommendations for Outpatient Follow-up:  1. Repeat basic metabolic panel to follow across renal function 2. Reassess symptoms resolution and further improvement for follow-up visit.  Continue assisting with control of obesity. 3. Reassess blood pressure and adjust antihypertensive regimen as needed.   Discharge Diagnoses:  Active Problems:   Cholelithiasis   Epigastric pain   Non-intractable vomiting   Obesity, Class III, BMI 40-49.9 (morbid obesity) (HCC)   Essential hypertension   Discharge Condition: Stable and improved.  Discharged back to assisted living facility for further care and rehabilitation.  Follow-up with PCP in 2 weeks.  CODE STATUS: Full code.  Diet recommendation: Heart healthy/low-fat diet.  Filed Weights   06/30/20 1951 07/01/20 0514  Weight: (!) 137.4 kg (!) 134.8 kg    History of present illness:  As per H&P written by Dr. Georgeann [name] on 06/30/20 Cameron A Combsis a 69 y.o.malewith medical history significant ofGERD, depression, morbid obesity, chronic pain, diffuse idiopathic skeletal hyperostosis who presents to the ED for evaluation of abdominal pain. Patient reports abdominal pain every day for a week. Epigastric pain occurred today. The patient was previously at [facility] ED earlier today and was given nausea medications which initially helped and the patient was discharged from the emergency department. However symptoms are back and the patient presented to [name], ED  Patient does have a history of gallstones for which he was diagnosed several years ago via ultrasound however at that time they were not bothersome. On presentation today ultrasound could not be performed due to lack of ability ability however abdominal CT was performed that did demonstrate  cholelithiasis without radiographic evidence of acute cholecystitis. Lab work was significant for mild leukocytosis of 12.6 with neutrophilic predominance. Remainder of laboratory work-up overall unrevealing.  The patient did endorse severe abdominal pain was given pain control and antiemetics with good result. On my evaluation patient is resting comfortably. He is in no visible distress. ER physician spoke with covering surgery Dr. [name] requested medicine admission for completion of abdominal ultrasound and general surgery consult in the morning.   ED Course:Initial vital signs are reassuring. Patient was given intravenous morphine and Reglan for pain and nausea control. Abdominal CT completed, results as above. Hospitalist contacted for admission  Hospital Course:  1-cholelithiasis with concern for biliary colic -No fever, stable WBCs and currently not complaining of any pain. -Patient also denies nausea and vomiting. -CT scan confirming cholelithiasis without signs of cholecystitis; there was concern for biliary duct dilatation. -Patient HIDA scan and MRCP has been negative for choledocholithiasis and significant abnormalities of gallbladder dysfunction. -At this moment decision has been made to continue conservative management only and to follow-up on as-needed basis with general surgery as an outpatient. -No surgical intervention recommended at this time.  2-gastroesophageal reflux disease -Continue PPI. -Patient has been transitioned to Protonix every 12 hours and Pepcid nightly in order to provide better management of his symptoms. -Lifestyle changes discussed and encouraged.  3-diffuse skeletal hyperstosis and chronic pain -Continue as needed analgesics and supportive care. -Patient will return to ALF for further care and and assistance.  4-hypertension -Continue current antihypertensive regimen -Blood pressure stable and well-controlled. -Heart healthy diet  has been encouraged.  5-depression/insomnia -No suicidal ideation or hallucination: -Continue the use of Cymbalta, as needed alprazolam and nightly trazodone.  6-morbid obesity -Low calorie  diet and portion control has been discussed with patient -Body mass index is 45.19 kg/m.   Procedures: See below for x-ray reports  Consultations:  General surgery  Discharge Exam: Vitals:   07/02/20 2157 07/03/20 0507  BP: (!) 104/46 119/68  Pulse: 100 99  Resp: 20 20  Temp: 98.5 F (36.9 C) 98.3 F (36.8 C)  SpO2: 92% 93%   General exam: Appears calm and comfortable, morbidly obese Respiratory system: Clear to auscultation. Respiratory effort normal. Cardiovascular system: S1 & S2 heard, RRR. No JVD, murmurs, rubs, gallops or clicks. No pedal edema. Gastrointestinal system: Abdomen is nondistended, soft and nontender. No organomegaly or masses felt. Normal bowel sounds heard. Central nervous system: Alert and oriented. No focal neurological deficits. Extremities: Symmetric 5 x 5 power. Skin: No rashes, lesions or ulcers.  Psychiatry: Judgement and insight appear normal. Mood & affect appropriate.    Discharge Instructions   Discharge Instructions    Diet - low sodium heart healthy   Complete by: As directed    Discharge instructions   Complete by: As directed    Maintain adequate hydration Follow heart healthy/low calorie and low fat diet. Take medications as prescribed. Follow-up with PCP in 2 weeks. Follow-up with general surgery as needed (especially if symptoms of biliary colic returns).   Increase activity slowly   Complete by: As directed      Allergies as of 07/03/2020      Reactions   Ceclor [cefaclor] Other (See Comments)   Keeps patient wake for days.    Depakote [divalproex Sodium]    Stomach upset   Nsaids Other (See Comments)   G.I. Upset   Other Other (See Comments)   Steroids make patient extremely agitated       Medication List    STOP  taking these medications   lansoprazole 30 MG capsule Commonly known as: PREVACID     TAKE these medications   acetaminophen 650 MG CR tablet Commonly known as: TYLENOL Take 650 mg by mouth every 8 (eight) hours as needed for pain.   ALPRAZolam 1 MG tablet Commonly known as: XANAX Take 1 tablet (1 mg total) by mouth every 12 (twelve) hours as needed for anxiety or sleep. What changed:   when to take this  reasons to take this   cetirizine 10 MG tablet Commonly known as: ZYRTEC Take 10 mg by mouth daily.   DULoxetine HCl 30 MG Csdr Take 30 mg by mouth 2 (two) times daily.   famotidine 20 MG tablet Commonly known as: Pepcid Take 1 tablet (20 mg total) by mouth at bedtime.   furosemide 80 MG tablet Commonly known as: LASIX Take 80 mg by mouth daily.   lisinopril 10 MG tablet Commonly known as: ZESTRIL Take 10 mg by mouth daily.   ondansetron 8 MG tablet Commonly known as: ZOFRAN Take 0.5 tablets (4 mg total) by mouth every 8 (eight) hours as needed for nausea or vomiting. What changed: medication strength   oxycodone 5 MG capsule Commonly known as: OXY-IR Take 1 capsule (5 mg total) by mouth in the morning, at noon, and at bedtime.   pantoprazole 40 MG tablet Commonly known as: PROTONIX Take 1 tablet (40 mg total) by mouth 2 (two) times daily.   potassium chloride 10 MEQ tablet Commonly known as: KLOR-CON Take 10 mEq by mouth daily.   tiZANidine 4 MG tablet Commonly known as: ZANAFLEX Take 4 mg by mouth in the morning and at bedtime.   traZODone 100  MG tablet Commonly known as: DESYREL Take 100 mg by mouth at bedtime.   triamcinolone cream 0.1 % Commonly known as: KENALOG Apply 1 application topically 3 (three) times daily as needed.      Allergies  Allergen Reactions  . Ceclor [Cefaclor] Other (See Comments)    Keeps patient wake for days.   . Depakote [Divalproex Sodium]     Stomach upset  . Nsaids Other (See Comments)    G.I. Upset  . Other  Other (See Comments)    Steroids make patient extremely agitated     Contact information for follow-up providers    Vyas, Dhruv B, MD. Schedule an appointment as soon as possible for a visit in 2 week(s).   Specialty: Internal Medicine Contact information: [address] [place] [address] [phone]            Contact information for after-discharge care    Destination    [facility] .   Service: Assisted Living Contact information: [address] [place] [address] [phone]                   The results of significant diagnostics from this hospitalization (including imaging, microbiology, ancillary and laboratory) are listed below for reference.    Significant Diagnostic Studies: CT ABDOMEN PELVIS W CONTRAST  Result Date: 06/30/2020 CLINICAL DATA:  Epigastric abdominal pain EXAM: CT ABDOMEN AND PELVIS WITH CONTRAST TECHNIQUE: Multidetector CT imaging of the abdomen and pelvis was performed using the standard protocol following bolus administration of intravenous contrast. CONTRAST:  OMNIPAQUE IOHEXOL 300 MG/ML  SOLN COMPARISON:  None. FINDINGS: Lower chest: The visualized lung bases are clear. Right gynecomastia noted. Visualized heart and pericardium are unremarkable. Hepatobiliary: Cholelithiasis without CT evidence of acute cholecystitis. The extrahepatic bile duct is dilated, measuring 11 mm in diameter, best seen on coronal image # 64/6. A a distal obstructing lesion is not clearly identified. No intrahepatic biliary ductal dilation. The liver is unremarkable. Pancreas: Unremarkable Spleen: Unremarkable Adrenals/Urinary Tract: The adrenal glands and kidneys are unremarkable. The bladder is mildly distended, but is otherwise unremarkable. Stomach/Bowel: The large and small bowel, stomach, and appendix are unremarkable. No free intraperitoneal gas or fluid. There is mild wispy mesenteric infiltration at the mesenteric root, nonspecific.  Vascular/Lymphatic: Minimal atherosclerotic calcification within the abdominal aorta. The abdominal vasculature is otherwise unremarkable. No pathologic adenopathy within the abdomen and pelvis. Reproductive: Unremarkable Other: Small left fat containing inguinal hernia is present. Musculoskeletal: Advanced degenerative changes are seen within the lumbar spine. No acute bone abnormality noted. IMPRESSION: Cholelithiasis without CT evidence of acute cholecystitis. Dilation of the extrahepatic bile duct is nonspecific. Correlation with liver enzymes would be helpful to exclude an obstructive process such is ampullary stenosis or potentially debris within the common duct. If abnormal, this could be better assessed with ERCP or MRCP examination. Aortic Atherosclerosis (ICD10-I70.0). Electronically Signed   By: Helyn Numbers MD   On: 06/30/2020 21:54   MR 3D Recon At Scanner  Result Date: 07/01/2020 CLINICAL DATA:  Cholelithiasis with dilated biliary tree EXAM: MRI ABDOMEN WITHOUT AND WITH CONTRAST (INCLUDING MRCP) TECHNIQUE: Multiplanar multisequence MR imaging of the abdomen was performed both before and after the administration of intravenous contrast. Heavily T2-weighted images of the biliary and pancreatic ducts were obtained, and three-dimensional MRCP images were rendered by post processing. CONTRAST:  21mL GADAVIST GADOBUTROL 1 MMOL/ML IV SOLN COMPARISON:  06/30/2020 CT evaluation FINDINGS: Lower chest: Incidental imaging of the lung bases with limited assessment  on MRI. No pleural effusion. Hepatobiliary: Cholelithiasis.  No pericholecystic stranding. Biliary duct distension is similar to the previous exam measuring approximately 8 mm in terms of extrahepatic biliary duct dilation. Mild intrahepatic biliary duct distension as well. Suggestion of either meniscus sign or slightly muted tapering of the distal common bile duct. The MRCP images are limited secondary to motion. No filling defect to suggest  biliary calculus in the biliary tree. Hepatic steatosis, moderate to marked without focal, suspicious hepatic lesion. Pancreas: Trace pancreatic ductal dilation without divisum. No peripancreatic inflammation. Fatty atrophy of much of the pancreas without focal lesion. Spleen:  Spleen normal in size and contour without focal lesion. Adrenals/Urinary Tract: Adrenal glands are normal. Kidneys enhance symmetrically. Small cyst in the anterior RIGHT kidney. Stomach/Bowel: Gastrointestinal tract without acute process to the extent evaluated. Vascular/Lymphatic: No aneurysmal dilation of the abdominal aorta. No adenopathy. Other:  None. Musculoskeletal: No suspicious bone lesions identified. IMPRESSION: 1. Biliary duct distension is similar to the previous exam. MRCP sequences mildly limited due to respiratory motion. Tapering of the common bile duct is slightly more abrupt than expected for stricture though this could still represent stricture or sphincter of Oddi dysfunction. Small stone in this area or lesion could also have this appearance. Consider short interval follow-up if no intervention is planned, at 3 month interval or based on clinical symptoms. 2. Cholelithiasis. No evidence of acute cholecystitis. 3. Hepatic steatosis, moderate to marked without focal, suspicious hepatic lesion. Electronically Signed   By: Donzetta Kohut M.D.   On: 07/01/2020 13:51   NM Hepato W/EF  Result Date: 07/02/2020 CLINICAL DATA:  Abdominal pain and nausea EXAM: NUCLEAR MEDICINE HEPATOBILIARY IMAGING WITH GALLBLADDER EF VIEWS: Anterior right upper quadrant RADIOPHARMACEUTICALS:  4.98 mCi Tc-22m Choletec IV COMPARISON:  None. FINDINGS: Liver uptake of radiotracer is unremarkable. There is prompt visualization of gallbladder and small bowel, indicating patency of the cystic and common bile ducts. A weight based dose, 2.73 mcg, of CCK was administered intravenously with calculation of the computer generated ejection fraction of  radiotracer from the gallbladder. The patient experienced mild abdominal pain with CCK administration. The computer generated ejection fraction of radiotracer from the gallbladder is normal at 99%, normal greater than 40%. IMPRESSION: Normal ejection fraction of radiotracer from the gallbladder. Patient experienced mild abdominal pain with the CCK administration. Cystic and common bile ducts are patent as is evidenced by visualization of gallbladder and small bowel. Electronically Signed   By: Bretta Bang III M.D.   On: 07/02/2020 11:07   MR ABDOMEN MRCP W WO CONTAST  Result Date: 07/01/2020 CLINICAL DATA:  Cholelithiasis with dilated biliary tree EXAM: MRI ABDOMEN WITHOUT AND WITH CONTRAST (INCLUDING MRCP) TECHNIQUE: Multiplanar multisequence MR imaging of the abdomen was performed both before and after the administration of intravenous contrast. Heavily T2-weighted images of the biliary and pancreatic ducts were obtained, and three-dimensional MRCP images were rendered by post processing. CONTRAST:  95mL GADAVIST GADOBUTROL 1 MMOL/ML IV SOLN COMPARISON:  06/30/2020 CT evaluation FINDINGS: Lower chest: Incidental imaging of the lung bases with limited assessment on MRI. No pleural effusion. Hepatobiliary: Cholelithiasis.  No pericholecystic stranding. Biliary duct distension is similar to the previous exam measuring approximately 8 mm in terms of extrahepatic biliary duct dilation. Mild intrahepatic biliary duct distension as well. Suggestion of either meniscus sign or slightly muted tapering of the distal common bile duct. The MRCP images are limited secondary to motion. No filling defect to suggest biliary calculus in the biliary tree. Hepatic steatosis, moderate  to marked without focal, suspicious hepatic lesion. Pancreas: Trace pancreatic ductal dilation without divisum. No peripancreatic inflammation. Fatty atrophy of much of the pancreas without focal lesion. Spleen:  Spleen normal in size and  contour without focal lesion. Adrenals/Urinary Tract: Adrenal glands are normal. Kidneys enhance symmetrically. Small cyst in the anterior RIGHT kidney. Stomach/Bowel: Gastrointestinal tract without acute process to the extent evaluated. Vascular/Lymphatic: No aneurysmal dilation of the abdominal aorta. No adenopathy. Other:  None. Musculoskeletal: No suspicious bone lesions identified. IMPRESSION: 1. Biliary duct distension is similar to the previous exam. MRCP sequences mildly limited due to respiratory motion. Tapering of the common bile duct is slightly more abrupt than expected for stricture though this could still represent stricture or sphincter of Oddi dysfunction. Small stone in this area or lesion could also have this appearance. Consider short interval follow-up if no intervention is planned, at 3 month interval or based on clinical symptoms. 2. Cholelithiasis. No evidence of acute cholecystitis. 3. Hepatic steatosis, moderate to marked without focal, suspicious hepatic lesion. Electronically Signed   By: Donzetta Kohut M.D.   On: 07/01/2020 13:51   US Abdomen Limited RUQ  Result Date: 07/01/2020 CLINICAL DATA:  Questionable lesion common bile duct on recent CT EXAM: ULTRASOUND ABDOMEN LIMITED RIGHT UPPER QUADRANT COMPARISON:  CT abdomen and pelvis June 30, 2020 FINDINGS: Gallbladder: Within the gallbladder, there are echogenic foci which move and shadow consistent with cholelithiasis. Largest gallstone measures 2.0 cm in length. There is no gallbladder wall thickening or pericholecystic fluid. No sonographic Murphy sign noted by sonographer. Common bile duct: Diameter: There is prominence of the common bile duct measuring up to 10 mm in the mid to distal aspect. Note that a portion of the distal most aspect of common bile duct is obscured by gas. There is no intrahepatic biliary duct dilatation appreciable on this study. Liver: No focal lesion identified. Liver echogenicity is increased diffusely.  Portal vein is patent on color Doppler imaging with normal direction of blood flow towards the liver. Other: None. IMPRESSION: 1. There is extrahepatic biliary duct dilatation. An obstructing lesion is not seen in the visualized portions of the common bile duct. Note, however, that a portion of the distal common bile duct is obscured by gas. Given the CT appearance and in the lack of visualization of the distal most aspect of the common bile duct on this study, correlation with MRCP may well be advised to further evaluate for potential distal obstructing biliary ductal lesion. 2. Cholelithiasis. No gallbladder wall thickening or pericholecystic fluid. 3. Diffuse increase in liver echogenicity, a finding most likely due to hepatic steatosis. No focal liver lesions are evident on this study; it must be cautioned that the sensitivity of ultrasound for detection of focal liver lesions is diminished significantly in this circumstance. Electronically Signed   By: Bretta Bang III M.D.   On: 07/01/2020 09:37    Microbiology: Recent Results (from the past 240 hour(s))  SARS Coronavirus 2 by RT PCR (hospital order, performed in [facility] hospital lab) Nasopharyngeal Nasopharyngeal Swab     Status: None   Collection Time: 06/30/20 10:33 PM   Specimen: Nasopharyngeal Swab  Result Value Ref Range Status   SARS Coronavirus 2 NEGATIVE NEGATIVE Final    Comment: (NOTE) SARS-CoV-2 target nucleic acids are NOT DETECTED.  The SARS-CoV-2 RNA is generally detectable in upper and lower respiratory specimens during the acute phase of infection. The lowest concentration of SARS-CoV-2 viral copies this assay can detect is 250 copies /  mL. A negative result does not preclude SARS-CoV-2 infection and should not be used as the sole basis for treatment or other patient management decisions.  A negative result may occur with improper specimen collection / handling, submission of specimen other than nasopharyngeal  swab, presence of viral mutation(s) within the areas targeted by this assay, and inadequate number of viral copies (<250 copies / mL). A negative result must be combined with clinical observations, patient history, and epidemiological information.  Fact Sheet for Patients:   BoilerBrush.com.cyhttps://www.fda.gov/media/136312/download  Fact Sheet for Healthcare Providers: https://pope.com/https://www.fda.gov/media/136313/download  This test is not yet approved or  cleared by the [place] FDA and has been authorized for detection and/or diagnosis of SARS-CoV-2 by FDA under an Emergency Use Authorization (EUA).  This EUA will remain in effect (meaning this test can be used) for the duration of the COVID-19 declaration under Section 564(b)(1) of the Act, 21 U.S.C. section 360bbb-3(b)(1), unless the authorization is terminated or revoked sooner.  Performed at [facility], [address] [place], [place] [address]      Labs: Basic Metabolic Panel: Recent Labs  Lab 06/30/20 2006 07/01/20 0321 07/03/20 0830  NA 134* 136 137  K 3.6 4.7 3.8  CL 95* 97* 102  CO2 27 29 27   GLUCOSE 119* 100* 101*  BUN 19 17 11   CREATININE 1.07 1.06 0.80  CALCIUM 8.7* 8.7* 8.6*  MG  --   --  1.9   Liver Function Tests: Recent Labs  Lab 06/30/20 2006 07/01/20 0321 07/03/20 0830  AST 29 21 22   ALT 35 31 29  ALKPHOS 61 54 51  BILITOT 0.5 0.4 0.5  PROT 7.6 7.0 6.1*  ALBUMIN 4.0 3.7 3.3*   Recent Labs  Lab 06/30/20 2006  LIPASE 30   CBC: Recent Labs  Lab 06/30/20 2006 07/01/20 0321 07/03/20 0830  WBC 12.6* 11.6* 6.2  NEUTROABS 9.9*  --  4.1  HGB 13.4 12.7* 12.2*  HCT 42.0 39.8 38.5*  MCV 84.5 86.0 87.1  PLT PLATELET CLUMPS NOTED ON SMEAR, COUNT APPEARS ADEQUATE 237 190    Signed:  Hughie Clossavi Kyvon Hu MD.  [facility] 07/03/2020, 10:20 AM

## 2020-07-03 NOTE — Progress Notes (Signed)
Lab in to draw blood. Pt tolerated well. Pt states does not want to eat breakfast at this time, wants to sleep a little longer.

## 2020-07-03 NOTE — Progress Notes (Signed)
Subjective: Patient denies any significant abdominal pain. Tolerating soft diet well.  Objective: Vital signs in last 24 hours: Temp:  [98.2 F (36.8 C)-98.6 F (37 C)] 98.3 F (36.8 C) (07/28 0507) Pulse Rate:  [93-114] 99 (07/28 0507) Resp:  [16-20] 20 (07/28 0507) BP: (90-119)/(46-68) 119/68 (07/28 0507) SpO2:  [92 %-96 %] 93 % (07/28 0507) Last BM Date: 07/02/20  Intake/Output from previous day: 07/27 0701 - 07/28 0700 In: 1121.8 [I.V.:1121.8] Out: 600 [Urine:600] Intake/Output this shift: No intake/output data recorded.  General appearance: alert, cooperative and no distress GI: soft, non-tender; bowel sounds normal; no masses,  no organomegaly  Lab Results:  Recent Labs    06/30/20 2006 07/01/20 0321  WBC 12.6* 11.6*  HGB 13.4 12.7*  HCT 42.0 39.8  PLT PLATELET CLUMPS NOTED ON SMEAR, COUNT APPEARS ADEQUATE 237   BMET Recent Labs    06/30/20 2006 07/01/20 0321  NA 134* 136  K 3.6 4.7  CL 95* 97*  CO2 27 29  GLUCOSE 119* 100*  BUN 19 17  CREATININE 1.07 1.06  CALCIUM 8.7* 8.7*   PT/INR Recent Labs    07/01/20 0321  LABPROT 13.6  INR 1.1    Studies/Results: MR 3D Recon At Scanner  Result Date: 07/01/2020 CLINICAL DATA:  Cholelithiasis with dilated biliary tree EXAM: MRI ABDOMEN WITHOUT AND WITH CONTRAST (INCLUDING MRCP) TECHNIQUE: Multiplanar multisequence MR imaging of the abdomen was performed both before and after the administration of intravenous contrast. Heavily T2-weighted images of the biliary and pancreatic ducts were obtained, and three-dimensional MRCP images were rendered by post processing. CONTRAST:  86mL GADAVIST GADOBUTROL 1 MMOL/ML IV SOLN COMPARISON:  06/30/2020 CT evaluation FINDINGS: Lower chest: Incidental imaging of the lung bases with limited assessment on MRI. No pleural effusion. Hepatobiliary: Cholelithiasis.  No pericholecystic stranding. Biliary duct distension is similar to the previous exam measuring approximately 8 mm  in terms of extrahepatic biliary duct dilation. Mild intrahepatic biliary duct distension as well. Suggestion of either meniscus sign or slightly muted tapering of the distal common bile duct. The MRCP images are limited secondary to motion. No filling defect to suggest biliary calculus in the biliary tree. Hepatic steatosis, moderate to marked without focal, suspicious hepatic lesion. Pancreas: Trace pancreatic ductal dilation without divisum. No peripancreatic inflammation. Fatty atrophy of much of the pancreas without focal lesion. Spleen:  Spleen normal in size and contour without focal lesion. Adrenals/Urinary Tract: Adrenal glands are normal. Kidneys enhance symmetrically. Small cyst in the anterior RIGHT kidney. Stomach/Bowel: Gastrointestinal tract without acute process to the extent evaluated. Vascular/Lymphatic: No aneurysmal dilation of the abdominal aorta. No adenopathy. Other:  None. Musculoskeletal: No suspicious bone lesions identified. IMPRESSION: 1. Biliary duct distension is similar to the previous exam. MRCP sequences mildly limited due to respiratory motion. Tapering of the common bile duct is slightly more abrupt than expected for stricture though this could still represent stricture or sphincter of Oddi dysfunction. Small stone in this area or lesion could also have this appearance. Consider short interval follow-up if no intervention is planned, at 3 month interval or based on clinical symptoms. 2. Cholelithiasis. No evidence of acute cholecystitis. 3. Hepatic steatosis, moderate to marked without focal, suspicious hepatic lesion. Electronically Signed   By: Donzetta Kohut M.D.   On: 07/01/2020 13:51   NM Hepato W/EF  Result Date: 07/02/2020 CLINICAL DATA:  Abdominal pain and nausea EXAM: NUCLEAR MEDICINE HEPATOBILIARY IMAGING WITH GALLBLADDER EF VIEWS: Anterior right upper quadrant RADIOPHARMACEUTICALS:  4.98 mCi Tc-65m Choletec IV COMPARISON:  None. FINDINGS: Liver uptake of radiotracer  is unremarkable. There is prompt visualization of gallbladder and small bowel, indicating patency of the cystic and common bile ducts. A weight based dose, 2.73 mcg, of CCK was administered intravenously with calculation of the computer generated ejection fraction of radiotracer from the gallbladder. The patient experienced mild abdominal pain with CCK administration. The computer generated ejection fraction of radiotracer from the gallbladder is normal at 99%, normal greater than 40%. IMPRESSION: Normal ejection fraction of radiotracer from the gallbladder. Patient experienced mild abdominal pain with the CCK administration. Cystic and common bile ducts are patent as is evidenced by visualization of gallbladder and small bowel. Electronically Signed   By: Bretta Bang III M.D.   On: 07/02/2020 11:07   MR ABDOMEN MRCP W WO CONTAST  Result Date: 07/01/2020 CLINICAL DATA:  Cholelithiasis with dilated biliary tree EXAM: MRI ABDOMEN WITHOUT AND WITH CONTRAST (INCLUDING MRCP) TECHNIQUE: Multiplanar multisequence MR imaging of the abdomen was performed both before and after the administration of intravenous contrast. Heavily T2-weighted images of the biliary and pancreatic ducts were obtained, and three-dimensional MRCP images were rendered by post processing. CONTRAST:  27mL GADAVIST GADOBUTROL 1 MMOL/ML IV SOLN COMPARISON:  06/30/2020 CT evaluation FINDINGS: Lower chest: Incidental imaging of the lung bases with limited assessment on MRI. No pleural effusion. Hepatobiliary: Cholelithiasis.  No pericholecystic stranding. Biliary duct distension is similar to the previous exam measuring approximately 8 mm in terms of extrahepatic biliary duct dilation. Mild intrahepatic biliary duct distension as well. Suggestion of either meniscus sign or slightly muted tapering of the distal common bile duct. The MRCP images are limited secondary to motion. No filling defect to suggest biliary calculus in the biliary tree.  Hepatic steatosis, moderate to marked without focal, suspicious hepatic lesion. Pancreas: Trace pancreatic ductal dilation without divisum. No peripancreatic inflammation. Fatty atrophy of much of the pancreas without focal lesion. Spleen:  Spleen normal in size and contour without focal lesion. Adrenals/Urinary Tract: Adrenal glands are normal. Kidneys enhance symmetrically. Small cyst in the anterior RIGHT kidney. Stomach/Bowel: Gastrointestinal tract without acute process to the extent evaluated. Vascular/Lymphatic: No aneurysmal dilation of the abdominal aorta. No adenopathy. Other:  None. Musculoskeletal: No suspicious bone lesions identified. IMPRESSION: 1. Biliary duct distension is similar to the previous exam. MRCP sequences mildly limited due to respiratory motion. Tapering of the common bile duct is slightly more abrupt than expected for stricture though this could still represent stricture or sphincter of Oddi dysfunction. Small stone in this area or lesion could also have this appearance. Consider short interval follow-up if no intervention is planned, at 3 month interval or based on clinical symptoms. 2. Cholelithiasis. No evidence of acute cholecystitis. 3. Hepatic steatosis, moderate to marked without focal, suspicious hepatic lesion. Electronically Signed   By: Donzetta Kohut M.D.   On: 07/01/2020 13:51   US Abdomen Limited RUQ  Result Date: 07/01/2020 CLINICAL DATA:  Questionable lesion common bile duct on recent CT EXAM: ULTRASOUND ABDOMEN LIMITED RIGHT UPPER QUADRANT COMPARISON:  CT abdomen and pelvis June 30, 2020 FINDINGS: Gallbladder: Within the gallbladder, there are echogenic foci which move and shadow consistent with cholelithiasis. Largest gallstone measures 2.0 cm in length. There is no gallbladder wall thickening or pericholecystic fluid. No sonographic Murphy sign noted by sonographer. Common bile duct: Diameter: There is prominence of the common bile duct measuring up to 10 mm in  the mid to distal aspect. Note that a portion of the distal most aspect of common bile duct  is obscured by gas. There is no intrahepatic biliary duct dilatation appreciable on this study. Liver: No focal lesion identified. Liver echogenicity is increased diffusely. Portal vein is patent on color Doppler imaging with normal direction of blood flow towards the liver. Other: None. IMPRESSION: 1. There is extrahepatic biliary duct dilatation. An obstructing lesion is not seen in the visualized portions of the common bile duct. Note, however, that a portion of the distal common bile duct is obscured by gas. Given the CT appearance and in the lack of visualization of the distal most aspect of the common bile duct on this study, correlation with MRCP may well be advised to further evaluate for potential distal obstructing biliary ductal lesion. 2. Cholelithiasis. No gallbladder wall thickening or pericholecystic fluid. 3. Diffuse increase in liver echogenicity, a finding most likely due to hepatic steatosis. No focal liver lesions are evident on this study; it must be cautioned that the sensitivity of ultrasound for detection of focal liver lesions is diminished significantly in this circumstance. Electronically Signed   By: Bretta Bang III M.D.   On: 07/01/2020 09:37    Anti-infectives: Anti-infectives (From admission, onward)   None      Assessment/Plan: Impression: Cholelithiasis, asymptomatic at this point. Suspect patient had episode of gastritis. HIDA scan reassuring. Okay for discharge from surgery standpoint.  LOS: 2 days    Franky Macho 07/03/2020

## 2023-01-07 DIAGNOSIS — L539 Erythematous condition, unspecified: Secondary | ICD-10-CM | POA: Diagnosis not present

## 2023-01-07 DIAGNOSIS — L304 Erythema intertrigo: Secondary | ICD-10-CM | POA: Diagnosis not present

## 2023-02-04 DIAGNOSIS — R269 Unspecified abnormalities of gait and mobility: Secondary | ICD-10-CM

## 2023-02-04 DIAGNOSIS — R5381 Other malaise: Secondary | ICD-10-CM | POA: Diagnosis not present

## 2023-02-04 DIAGNOSIS — J9601 Acute respiratory failure with hypoxia: Secondary | ICD-10-CM | POA: Diagnosis not present

## 2023-02-04 DIAGNOSIS — M6281 Muscle weakness (generalized): Secondary | ICD-10-CM

## 2023-02-04 DIAGNOSIS — J09X2 Influenza due to identified novel influenza A virus with other respiratory manifestations: Secondary | ICD-10-CM | POA: Diagnosis not present

## 2023-02-04 DIAGNOSIS — G894 Chronic pain syndrome: Secondary | ICD-10-CM

## 2023-02-04 DIAGNOSIS — R600 Localized edema: Secondary | ICD-10-CM | POA: Diagnosis not present

## 2023-02-25 DIAGNOSIS — M4810 Ankylosing hyperostosis [Forestier], site unspecified: Secondary | ICD-10-CM | POA: Diagnosis not present

## 2023-02-25 DIAGNOSIS — D649 Anemia, unspecified: Secondary | ICD-10-CM

## 2023-02-25 DIAGNOSIS — M5136 Other intervertebral disc degeneration, lumbar region: Secondary | ICD-10-CM

## 2023-02-25 DIAGNOSIS — M47812 Spondylosis without myelopathy or radiculopathy, cervical region: Secondary | ICD-10-CM | POA: Diagnosis not present

## 2023-02-25 DIAGNOSIS — E1140 Type 2 diabetes mellitus with diabetic neuropathy, unspecified: Secondary | ICD-10-CM | POA: Diagnosis not present

## 2023-02-25 DIAGNOSIS — K219 Gastro-esophageal reflux disease without esophagitis: Secondary | ICD-10-CM

## 2023-02-25 DIAGNOSIS — I152 Hypertension secondary to endocrine disorders: Secondary | ICD-10-CM | POA: Diagnosis not present

## 2023-02-25 DIAGNOSIS — G894 Chronic pain syndrome: Secondary | ICD-10-CM

## 2023-03-01 DIAGNOSIS — M4810 Ankylosing hyperostosis [Forestier], site unspecified: Secondary | ICD-10-CM | POA: Diagnosis not present

## 2023-03-01 DIAGNOSIS — F419 Anxiety disorder, unspecified: Secondary | ICD-10-CM | POA: Diagnosis not present

## 2023-03-01 DIAGNOSIS — G609 Hereditary and idiopathic neuropathy, unspecified: Secondary | ICD-10-CM | POA: Diagnosis not present

## 2023-03-01 DIAGNOSIS — M519 Unspecified thoracic, thoracolumbar and lumbosacral intervertebral disc disorder: Secondary | ICD-10-CM | POA: Diagnosis not present

## 2023-03-04 DIAGNOSIS — R112 Nausea with vomiting, unspecified: Secondary | ICD-10-CM | POA: Diagnosis not present

## 2023-03-04 DIAGNOSIS — R197 Diarrhea, unspecified: Secondary | ICD-10-CM | POA: Diagnosis not present

## 2023-03-04 DIAGNOSIS — I700 Atherosclerosis of aorta: Secondary | ICD-10-CM

## 2023-03-04 DIAGNOSIS — K760 Fatty (change of) liver, not elsewhere classified: Secondary | ICD-10-CM

## 2023-03-04 DIAGNOSIS — E860 Dehydration: Secondary | ICD-10-CM | POA: Diagnosis not present

## 2023-03-04 DIAGNOSIS — E119 Type 2 diabetes mellitus without complications: Secondary | ICD-10-CM

## 2023-03-04 DIAGNOSIS — E876 Hypokalemia: Secondary | ICD-10-CM

## 2023-03-04 DIAGNOSIS — N200 Calculus of kidney: Secondary | ICD-10-CM | POA: Diagnosis not present

## 2023-04-19 DIAGNOSIS — N132 Hydronephrosis with renal and ureteral calculous obstruction: Secondary | ICD-10-CM

## 2023-04-19 DIAGNOSIS — S81802A Unspecified open wound, left lower leg, initial encounter: Secondary | ICD-10-CM

## 2023-04-19 DIAGNOSIS — G8929 Other chronic pain: Secondary | ICD-10-CM

## 2023-04-19 DIAGNOSIS — M4810 Ankylosing hyperostosis [Forestier], site unspecified: Secondary | ICD-10-CM

## 2023-04-22 DIAGNOSIS — L219 Seborrheic dermatitis, unspecified: Secondary | ICD-10-CM | POA: Diagnosis not present

## 2023-04-22 DIAGNOSIS — R21 Rash and other nonspecific skin eruption: Secondary | ICD-10-CM | POA: Diagnosis not present

## 2023-05-13 DIAGNOSIS — Z7689 Persons encountering health services in other specified circumstances: Secondary | ICD-10-CM

## 2023-05-13 DIAGNOSIS — D509 Iron deficiency anemia, unspecified: Secondary | ICD-10-CM

## 2023-06-03 DIAGNOSIS — R059 Cough, unspecified: Secondary | ICD-10-CM | POA: Diagnosis not present

## 2023-06-03 DIAGNOSIS — R0989 Other specified symptoms and signs involving the circulatory and respiratory systems: Secondary | ICD-10-CM | POA: Diagnosis not present

## 2023-06-03 DIAGNOSIS — R5381 Other malaise: Secondary | ICD-10-CM | POA: Diagnosis not present

## 2023-06-17 DIAGNOSIS — I10 Essential (primary) hypertension: Secondary | ICD-10-CM

## 2023-06-17 DIAGNOSIS — R269 Unspecified abnormalities of gait and mobility: Secondary | ICD-10-CM

## 2023-06-17 DIAGNOSIS — J181 Lobar pneumonia, unspecified organism: Secondary | ICD-10-CM | POA: Diagnosis not present

## 2023-06-17 DIAGNOSIS — F419 Anxiety disorder, unspecified: Secondary | ICD-10-CM

## 2023-06-17 DIAGNOSIS — D649 Anemia, unspecified: Secondary | ICD-10-CM | POA: Diagnosis not present

## 2023-06-17 DIAGNOSIS — K219 Gastro-esophageal reflux disease without esophagitis: Secondary | ICD-10-CM

## 2023-06-17 DIAGNOSIS — R059 Cough, unspecified: Secondary | ICD-10-CM | POA: Diagnosis not present

## 2023-06-17 DIAGNOSIS — R0902 Hypoxemia: Secondary | ICD-10-CM | POA: Diagnosis not present

## 2023-06-17 DIAGNOSIS — M6281 Muscle weakness (generalized): Secondary | ICD-10-CM

## 2023-06-17 DIAGNOSIS — J189 Pneumonia, unspecified organism: Secondary | ICD-10-CM | POA: Diagnosis not present

## 2023-06-17 DIAGNOSIS — F411 Generalized anxiety disorder: Secondary | ICD-10-CM | POA: Diagnosis not present

## 2023-06-17 DIAGNOSIS — Z8673 Personal history of transient ischemic attack (TIA), and cerebral infarction without residual deficits: Secondary | ICD-10-CM

## 2023-06-17 DIAGNOSIS — Z9181 History of falling: Secondary | ICD-10-CM

## 2023-06-24 DIAGNOSIS — M6281 Muscle weakness (generalized): Secondary | ICD-10-CM | POA: Diagnosis not present

## 2023-06-24 DIAGNOSIS — J189 Pneumonia, unspecified organism: Secondary | ICD-10-CM | POA: Diagnosis not present

## 2023-06-24 DIAGNOSIS — R269 Unspecified abnormalities of gait and mobility: Secondary | ICD-10-CM

## 2023-06-24 DIAGNOSIS — R059 Cough, unspecified: Secondary | ICD-10-CM | POA: Diagnosis not present

## 2023-06-24 DIAGNOSIS — R5381 Other malaise: Secondary | ICD-10-CM | POA: Diagnosis not present

## 2023-07-01 DIAGNOSIS — R5381 Other malaise: Secondary | ICD-10-CM

## 2023-07-01 DIAGNOSIS — R0602 Shortness of breath: Secondary | ICD-10-CM

## 2023-07-01 DIAGNOSIS — Z8701 Personal history of pneumonia (recurrent): Secondary | ICD-10-CM

## 2023-07-22 DIAGNOSIS — R600 Localized edema: Secondary | ICD-10-CM

## 2023-07-22 DIAGNOSIS — L989 Disorder of the skin and subcutaneous tissue, unspecified: Secondary | ICD-10-CM

## 2023-07-22 DIAGNOSIS — R635 Abnormal weight gain: Secondary | ICD-10-CM

## 2023-08-05 DIAGNOSIS — E1159 Type 2 diabetes mellitus with other circulatory complications: Secondary | ICD-10-CM

## 2023-08-05 DIAGNOSIS — E876 Hypokalemia: Secondary | ICD-10-CM | POA: Diagnosis not present

## 2023-08-05 DIAGNOSIS — R600 Localized edema: Secondary | ICD-10-CM | POA: Diagnosis not present

## 2023-08-05 DIAGNOSIS — R799 Abnormal finding of blood chemistry, unspecified: Secondary | ICD-10-CM

## 2023-08-16 DIAGNOSIS — G8929 Other chronic pain: Secondary | ICD-10-CM | POA: Diagnosis not present

## 2023-08-16 DIAGNOSIS — D649 Anemia, unspecified: Secondary | ICD-10-CM | POA: Diagnosis not present

## 2023-08-16 DIAGNOSIS — F411 Generalized anxiety disorder: Secondary | ICD-10-CM | POA: Diagnosis not present

## 2023-08-16 DIAGNOSIS — I10 Essential (primary) hypertension: Secondary | ICD-10-CM | POA: Diagnosis not present

## 2023-08-26 DIAGNOSIS — R5381 Other malaise: Secondary | ICD-10-CM | POA: Diagnosis not present

## 2023-08-26 DIAGNOSIS — R059 Cough, unspecified: Secondary | ICD-10-CM | POA: Diagnosis not present

## 2023-08-26 DIAGNOSIS — R062 Wheezing: Secondary | ICD-10-CM

## 2023-09-09 DIAGNOSIS — R059 Cough, unspecified: Secondary | ICD-10-CM | POA: Diagnosis not present

## 2023-09-09 DIAGNOSIS — R0989 Other specified symptoms and signs involving the circulatory and respiratory systems: Secondary | ICD-10-CM | POA: Diagnosis not present

## 2023-09-23 DIAGNOSIS — G894 Chronic pain syndrome: Secondary | ICD-10-CM | POA: Diagnosis not present

## 2023-09-23 DIAGNOSIS — L03312 Cellulitis of back [any part except buttock]: Secondary | ICD-10-CM | POA: Diagnosis not present

## 2023-10-07 DIAGNOSIS — F419 Anxiety disorder, unspecified: Secondary | ICD-10-CM | POA: Diagnosis not present

## 2023-10-07 DIAGNOSIS — F330 Major depressive disorder, recurrent, mild: Secondary | ICD-10-CM | POA: Diagnosis not present

## 2023-10-07 DIAGNOSIS — G894 Chronic pain syndrome: Secondary | ICD-10-CM | POA: Diagnosis not present

## 2023-10-21 DIAGNOSIS — F419 Anxiety disorder, unspecified: Secondary | ICD-10-CM | POA: Diagnosis not present

## 2023-10-21 DIAGNOSIS — G894 Chronic pain syndrome: Secondary | ICD-10-CM | POA: Diagnosis not present

## 2023-10-21 DIAGNOSIS — J309 Allergic rhinitis, unspecified: Secondary | ICD-10-CM | POA: Diagnosis not present

## 2023-10-28 DIAGNOSIS — G894 Chronic pain syndrome: Secondary | ICD-10-CM | POA: Diagnosis not present

## 2023-10-28 DIAGNOSIS — F330 Major depressive disorder, recurrent, mild: Secondary | ICD-10-CM | POA: Diagnosis not present

## 2023-10-28 DIAGNOSIS — F419 Anxiety disorder, unspecified: Secondary | ICD-10-CM | POA: Diagnosis not present

## 2023-10-28 DIAGNOSIS — F4310 Post-traumatic stress disorder, unspecified: Secondary | ICD-10-CM | POA: Diagnosis not present

## 2023-11-11 DIAGNOSIS — F419 Anxiety disorder, unspecified: Secondary | ICD-10-CM | POA: Diagnosis not present

## 2023-11-11 DIAGNOSIS — G894 Chronic pain syndrome: Secondary | ICD-10-CM | POA: Diagnosis not present

## 2023-11-18 DIAGNOSIS — R451 Restlessness and agitation: Secondary | ICD-10-CM | POA: Diagnosis not present

## 2023-11-18 DIAGNOSIS — F419 Anxiety disorder, unspecified: Secondary | ICD-10-CM | POA: Diagnosis not present

## 2023-11-18 DIAGNOSIS — F330 Major depressive disorder, recurrent, mild: Secondary | ICD-10-CM | POA: Diagnosis not present

## 2023-11-18 DIAGNOSIS — F4310 Post-traumatic stress disorder, unspecified: Secondary | ICD-10-CM | POA: Diagnosis not present

## 2023-11-25 DIAGNOSIS — G894 Chronic pain syndrome: Secondary | ICD-10-CM | POA: Diagnosis not present

## 2023-11-25 DIAGNOSIS — F419 Anxiety disorder, unspecified: Secondary | ICD-10-CM | POA: Diagnosis not present

## 2023-11-25 DIAGNOSIS — M62838 Other muscle spasm: Secondary | ICD-10-CM | POA: Diagnosis not present

## 2023-12-02 DIAGNOSIS — J45909 Unspecified asthma, uncomplicated: Secondary | ICD-10-CM

## 2023-12-02 DIAGNOSIS — N400 Enlarged prostate without lower urinary tract symptoms: Secondary | ICD-10-CM

## 2023-12-02 DIAGNOSIS — M6281 Muscle weakness (generalized): Secondary | ICD-10-CM

## 2023-12-02 DIAGNOSIS — G894 Chronic pain syndrome: Secondary | ICD-10-CM | POA: Diagnosis not present

## 2023-12-02 DIAGNOSIS — G4700 Insomnia, unspecified: Secondary | ICD-10-CM | POA: Diagnosis not present

## 2023-12-02 DIAGNOSIS — D509 Iron deficiency anemia, unspecified: Secondary | ICD-10-CM

## 2023-12-02 DIAGNOSIS — F419 Anxiety disorder, unspecified: Secondary | ICD-10-CM | POA: Diagnosis not present

## 2023-12-02 DIAGNOSIS — K219 Gastro-esophageal reflux disease without esophagitis: Secondary | ICD-10-CM

## 2023-12-02 DIAGNOSIS — Z8673 Personal history of transient ischemic attack (TIA), and cerebral infarction without residual deficits: Secondary | ICD-10-CM

## 2023-12-02 DIAGNOSIS — F330 Major depressive disorder, recurrent, mild: Secondary | ICD-10-CM | POA: Diagnosis not present

## 2023-12-07 DIAGNOSIS — G894 Chronic pain syndrome: Secondary | ICD-10-CM | POA: Diagnosis not present

## 2023-12-07 DIAGNOSIS — G4700 Insomnia, unspecified: Secondary | ICD-10-CM | POA: Diagnosis not present

## 2023-12-07 DIAGNOSIS — F419 Anxiety disorder, unspecified: Secondary | ICD-10-CM | POA: Diagnosis not present

## 2023-12-30 DIAGNOSIS — E1159 Type 2 diabetes mellitus with other circulatory complications: Secondary | ICD-10-CM | POA: Diagnosis not present

## 2023-12-30 DIAGNOSIS — R600 Localized edema: Secondary | ICD-10-CM | POA: Diagnosis not present

## 2024-01-06 DIAGNOSIS — F419 Anxiety disorder, unspecified: Secondary | ICD-10-CM | POA: Diagnosis not present

## 2024-01-06 DIAGNOSIS — R635 Abnormal weight gain: Secondary | ICD-10-CM

## 2024-02-03 DIAGNOSIS — E1159 Type 2 diabetes mellitus with other circulatory complications: Secondary | ICD-10-CM | POA: Diagnosis not present

## 2024-02-03 DIAGNOSIS — K219 Gastro-esophageal reflux disease without esophagitis: Secondary | ICD-10-CM | POA: Diagnosis not present

## 2024-02-03 DIAGNOSIS — I10 Essential (primary) hypertension: Secondary | ICD-10-CM | POA: Diagnosis not present

## 2024-02-10 DIAGNOSIS — L304 Erythema intertrigo: Secondary | ICD-10-CM | POA: Diagnosis not present

## 2024-02-10 DIAGNOSIS — M6281 Muscle weakness (generalized): Secondary | ICD-10-CM | POA: Diagnosis not present

## 2024-03-02 DIAGNOSIS — R112 Nausea with vomiting, unspecified: Secondary | ICD-10-CM

## 2024-03-02 DIAGNOSIS — A09 Infectious gastroenteritis and colitis, unspecified: Secondary | ICD-10-CM

## 2024-03-02 DIAGNOSIS — R5381 Other malaise: Secondary | ICD-10-CM

## 2024-03-02 DIAGNOSIS — R197 Diarrhea, unspecified: Secondary | ICD-10-CM

## 2024-04-13 DIAGNOSIS — R5381 Other malaise: Secondary | ICD-10-CM | POA: Diagnosis not present

## 2024-04-13 DIAGNOSIS — J069 Acute upper respiratory infection, unspecified: Secondary | ICD-10-CM | POA: Diagnosis not present

## 2024-04-13 DIAGNOSIS — R630 Anorexia: Secondary | ICD-10-CM | POA: Diagnosis not present

## 2024-04-13 DIAGNOSIS — B370 Candidal stomatitis: Secondary | ICD-10-CM | POA: Diagnosis not present

## 2024-04-20 DIAGNOSIS — J45909 Unspecified asthma, uncomplicated: Secondary | ICD-10-CM | POA: Diagnosis not present

## 2024-04-20 DIAGNOSIS — M6281 Muscle weakness (generalized): Secondary | ICD-10-CM

## 2024-04-20 DIAGNOSIS — E1159 Type 2 diabetes mellitus with other circulatory complications: Secondary | ICD-10-CM | POA: Diagnosis not present

## 2024-04-20 DIAGNOSIS — G894 Chronic pain syndrome: Secondary | ICD-10-CM | POA: Diagnosis not present

## 2024-04-20 DIAGNOSIS — D509 Iron deficiency anemia, unspecified: Secondary | ICD-10-CM | POA: Diagnosis not present

## 2024-04-25 ENCOUNTER — Other Ambulatory Visit

## 2024-04-25 DIAGNOSIS — I10 Essential (primary) hypertension: Secondary | ICD-10-CM

## 2024-04-25 DIAGNOSIS — E1349 Other specified diabetes mellitus with other diabetic neurological complication: Secondary | ICD-10-CM

## 2024-04-25 DIAGNOSIS — D509 Iron deficiency anemia, unspecified: Secondary | ICD-10-CM | POA: Diagnosis not present

## 2024-04-25 NOTE — Progress Notes (Signed)
 Lab Draw/Terra Ramona Burner

## 2024-04-26 LAB — BASIC METABOLIC PANEL WITH GFR
BUN/Creatinine Ratio: 18 (ref 10–24)
BUN: 17 mg/dL (ref 8–27)
CO2: 27 mmol/L (ref 20–29)
Calcium: 9.6 mg/dL (ref 8.6–10.2)
Chloride: 97 mmol/L (ref 96–106)
Creatinine, Ser: 0.94 mg/dL (ref 0.76–1.27)
Glucose: 145 mg/dL — ABNORMAL HIGH (ref 70–99)
Potassium: 4.1 mmol/L (ref 3.5–5.2)
Sodium: 144 mmol/L (ref 134–144)
eGFR: 85 mL/min/{1.73_m2} (ref >59)

## 2024-04-26 LAB — CBC WITH DIFFERENTIAL/PLATELET
Basophils Absolute: 0 10*3/uL (ref 0.0–0.2)
Basos: 0 %
EOS (ABSOLUTE): 0.2 10*3/uL (ref 0.0–0.4)
Eos: 2 %
Hematocrit: 46.3 % (ref 37.5–51.0)
Hemoglobin: 15.5 g/dL (ref 13.0–17.7)
Immature Grans (Abs): 0 10*3/uL (ref 0.0–0.1)
Immature Granulocytes: 0 %
Lymphocytes Absolute: 2.2 10*3/uL (ref 0.7–3.1)
Lymphs: 22 %
MCH: 29.8 pg (ref 26.6–33.0)
MCHC: 33.5 g/dL (ref 31.5–35.7)
MCV: 89 fL (ref 79–97)
Monocytes Absolute: 0.6 10*3/uL (ref 0.1–0.9)
Monocytes: 6 %
Neutrophils Absolute: 6.8 10*3/uL (ref 1.4–7.0)
Neutrophils: 70 %
Platelets: 168 10*3/uL (ref 150–450)
RBC: 5.2 x10E6/uL (ref 4.14–5.80)
RDW: 13.7 % (ref 11.6–15.4)
WBC: 9.7 10*3/uL (ref 3.4–10.8)

## 2024-04-26 LAB — MAGNESIUM: Magnesium: 1.8 mg/dL (ref 1.6–2.3)

## 2024-04-26 LAB — HEMOGLOBIN A1C
Est. average glucose Bld gHb Est-mCnc: 166 mg/dL
Hgb A1c MFr Bld: 7.4 % — ABNORMAL HIGH (ref 4.8–5.6)

## 2024-04-26 LAB — SPECIMEN STATUS REPORT

## 2024-04-27 DIAGNOSIS — E876 Hypokalemia: Secondary | ICD-10-CM

## 2024-04-27 DIAGNOSIS — E1159 Type 2 diabetes mellitus with other circulatory complications: Secondary | ICD-10-CM

## 2024-04-27 DIAGNOSIS — I509 Heart failure, unspecified: Secondary | ICD-10-CM

## 2024-04-27 DIAGNOSIS — R600 Localized edema: Secondary | ICD-10-CM

## 2024-06-22 DIAGNOSIS — K219 Gastro-esophageal reflux disease without esophagitis: Secondary | ICD-10-CM | POA: Diagnosis not present

## 2024-06-22 DIAGNOSIS — R21 Rash and other nonspecific skin eruption: Secondary | ICD-10-CM | POA: Diagnosis not present

## 2024-06-22 DIAGNOSIS — G894 Chronic pain syndrome: Secondary | ICD-10-CM | POA: Diagnosis not present

## 2024-06-22 DIAGNOSIS — L500 Allergic urticaria: Secondary | ICD-10-CM | POA: Diagnosis not present

## 2024-06-22 DIAGNOSIS — M6281 Muscle weakness (generalized): Secondary | ICD-10-CM

## 2024-07-20 DIAGNOSIS — M1612 Unilateral primary osteoarthritis, left hip: Secondary | ICD-10-CM | POA: Diagnosis not present

## 2024-07-20 DIAGNOSIS — M25552 Pain in left hip: Secondary | ICD-10-CM | POA: Diagnosis not present

## 2024-07-20 DIAGNOSIS — M150 Primary generalized (osteo)arthritis: Secondary | ICD-10-CM | POA: Diagnosis not present

## 2024-07-20 DIAGNOSIS — M6281 Muscle weakness (generalized): Secondary | ICD-10-CM

## 2024-07-20 DIAGNOSIS — G894 Chronic pain syndrome: Secondary | ICD-10-CM | POA: Diagnosis not present

## 2024-08-24 DIAGNOSIS — G894 Chronic pain syndrome: Secondary | ICD-10-CM

## 2024-08-24 DIAGNOSIS — E559 Vitamin D deficiency, unspecified: Secondary | ICD-10-CM

## 2024-08-24 DIAGNOSIS — R600 Localized edema: Secondary | ICD-10-CM | POA: Diagnosis not present

## 2024-08-24 DIAGNOSIS — J45909 Unspecified asthma, uncomplicated: Secondary | ICD-10-CM | POA: Diagnosis not present

## 2024-08-24 DIAGNOSIS — M6281 Muscle weakness (generalized): Secondary | ICD-10-CM

## 2024-08-24 DIAGNOSIS — G4700 Insomnia, unspecified: Secondary | ICD-10-CM

## 2024-08-24 DIAGNOSIS — N400 Enlarged prostate without lower urinary tract symptoms: Secondary | ICD-10-CM | POA: Diagnosis not present

## 2024-08-24 DIAGNOSIS — D509 Iron deficiency anemia, unspecified: Secondary | ICD-10-CM | POA: Diagnosis not present

## 2024-08-24 DIAGNOSIS — K219 Gastro-esophageal reflux disease without esophagitis: Secondary | ICD-10-CM

## 2024-09-07 ENCOUNTER — Other Ambulatory Visit: Payer: Self-pay

## 2024-09-07 DIAGNOSIS — Z1329 Encounter for screening for other suspected endocrine disorder: Secondary | ICD-10-CM | POA: Diagnosis not present

## 2024-09-07 DIAGNOSIS — E1349 Other specified diabetes mellitus with other diabetic neurological complication: Secondary | ICD-10-CM

## 2024-09-07 DIAGNOSIS — Z13220 Encounter for screening for lipoid disorders: Secondary | ICD-10-CM

## 2024-09-07 DIAGNOSIS — D509 Iron deficiency anemia, unspecified: Secondary | ICD-10-CM

## 2024-09-07 DIAGNOSIS — I10 Essential (primary) hypertension: Secondary | ICD-10-CM

## 2024-09-07 DIAGNOSIS — Z1321 Encounter for screening for nutritional disorder: Secondary | ICD-10-CM

## 2024-09-07 NOTE — Progress Notes (Signed)
 Lab Draw/[name]

## 2024-09-08 LAB — CBC WITH DIFFERENTIAL/PLATELET
Basophils Absolute: 0 x10E3/uL (ref 0.0–0.2)
Basos: 0 %
EOS (ABSOLUTE): 0.3 x10E3/uL (ref 0.0–0.4)
Eos: 4 %
Hematocrit: 43.1 % (ref 37.5–51.0)
Hemoglobin: 14.3 g/dL (ref 13.0–17.7)
Immature Grans (Abs): 0 x10E3/uL (ref 0.0–0.1)
Immature Granulocytes: 0 %
Lymphocytes Absolute: 2.1 x10E3/uL (ref 0.7–3.1)
Lymphs: 27 %
MCH: 29.8 pg (ref 26.6–33.0)
MCHC: 33.2 g/dL (ref 31.5–35.7)
MCV: 90 fL (ref 79–97)
Monocytes Absolute: 0.6 x10E3/uL (ref 0.1–0.9)
Monocytes: 8 %
Neutrophils Absolute: 4.6 x10E3/uL (ref 1.4–7.0)
Neutrophils: 61 %
Platelets: 160 x10E3/uL (ref 150–450)
RBC: 4.8 x10E6/uL (ref 4.14–5.80)
RDW: 13.6 % (ref 11.6–15.4)
WBC: 7.8 x10E3/uL (ref 3.4–10.8)

## 2024-09-08 LAB — CMP14 + ANION GAP
ALT: 21 IU/L (ref 0–44)
AST: 21 IU/L (ref 0–40)
Albumin: 3.8 g/dL (ref 3.8–4.8)
Alkaline Phosphatase: 123 IU/L (ref 47–123)
Anion Gap: 17 mmol/L (ref 10.0–18.0)
BUN/Creatinine Ratio: 18 (ref 10–24)
BUN: 17 mg/dL (ref 8–27)
Bilirubin Total: <0.2 mg/dL (ref 0.0–1.2)
CO2: 29 mmol/L (ref 20–29)
Calcium: 9.1 mg/dL (ref 8.6–10.2)
Chloride: 99 mmol/L (ref 96–106)
Creatinine, Ser: 0.97 mg/dL (ref 0.76–1.27)
Globulin, Total: 2.4 g/dL (ref 1.5–4.5)
Glucose: 138 mg/dL — ABNORMAL HIGH (ref 70–99)
Potassium: 4.2 mmol/L (ref 3.5–5.2)
Sodium: 145 mmol/L — ABNORMAL HIGH (ref 134–144)
Total Protein: 6.2 g/dL (ref 6.0–8.5)
eGFR: 82 mL/min/1.73 (ref >59)

## 2024-09-08 LAB — LIPID PANEL
Chol/HDL Ratio: 3.6 ratio (ref 0.0–5.0)
Cholesterol, Total: 165 mg/dL (ref 100–199)
HDL: 46 mg/dL (ref >39)
LDL Chol Calc (NIH): 82 mg/dL (ref 0–99)
Triglycerides: 221 mg/dL — ABNORMAL HIGH (ref 0–149)
VLDL Cholesterol Cal: 37 mg/dL (ref 5–40)

## 2024-09-08 LAB — VITAMIN B12: Vitamin B-12: 300 pg/mL (ref 232–1245)

## 2024-09-08 LAB — HEMOGLOBIN A1C
Est. average glucose Bld gHb Est-mCnc: 157 mg/dL
Hgb A1c MFr Bld: 7.1 % — ABNORMAL HIGH (ref 4.8–5.6)

## 2024-09-08 LAB — IRON: Iron: 56 ug/dL (ref 38–169)

## 2024-09-08 LAB — VITAMIN D 25 HYDROXY (VIT D DEFICIENCY, FRACTURES): Vit D, 25-Hydroxy: 35.5 ng/mL (ref 30.0–100.0)

## 2024-09-08 LAB — TSH: TSH: 1.54 u[IU]/mL (ref 0.450–4.500)

## 2024-09-12 ENCOUNTER — Ambulatory Visit: Payer: Self-pay

## 2024-09-12 NOTE — Progress Notes (Signed)
 sent

## 2024-09-14 DIAGNOSIS — L539 Erythematous condition, unspecified: Secondary | ICD-10-CM

## 2024-09-14 DIAGNOSIS — M6281 Muscle weakness (generalized): Secondary | ICD-10-CM

## 2024-09-14 DIAGNOSIS — L989 Disorder of the skin and subcutaneous tissue, unspecified: Secondary | ICD-10-CM

## 2024-09-14 DIAGNOSIS — L304 Erythema intertrigo: Secondary | ICD-10-CM

## 2024-10-09 DIAGNOSIS — U071 COVID-19: Secondary | ICD-10-CM

## 2024-10-09 DIAGNOSIS — I5032 Chronic diastolic (congestive) heart failure: Secondary | ICD-10-CM | POA: Diagnosis not present

## 2024-10-09 DIAGNOSIS — J449 Chronic obstructive pulmonary disease, unspecified: Secondary | ICD-10-CM | POA: Diagnosis not present

## 2024-10-09 DIAGNOSIS — N179 Acute kidney failure, unspecified: Secondary | ICD-10-CM | POA: Diagnosis not present

## 2024-10-09 DIAGNOSIS — G8929 Other chronic pain: Secondary | ICD-10-CM

## 2024-10-09 DIAGNOSIS — J9601 Acute respiratory failure with hypoxia: Secondary | ICD-10-CM | POA: Diagnosis not present

## 2024-10-10 DIAGNOSIS — I517 Cardiomegaly: Secondary | ICD-10-CM

## 2024-11-16 DIAGNOSIS — I5032 Chronic diastolic (congestive) heart failure: Secondary | ICD-10-CM

## 2024-11-16 DIAGNOSIS — R600 Localized edema: Secondary | ICD-10-CM

## 2024-11-16 DIAGNOSIS — R54 Age-related physical debility: Secondary | ICD-10-CM

## 2024-11-16 DIAGNOSIS — R262 Difficulty in walking, not elsewhere classified: Secondary | ICD-10-CM

## 2024-11-23 DIAGNOSIS — F419 Anxiety disorder, unspecified: Secondary | ICD-10-CM | POA: Diagnosis not present

## 2024-11-23 DIAGNOSIS — R600 Localized edema: Secondary | ICD-10-CM | POA: Diagnosis not present

## 2024-11-23 DIAGNOSIS — J449 Chronic obstructive pulmonary disease, unspecified: Secondary | ICD-10-CM | POA: Diagnosis not present

## 2024-11-23 DIAGNOSIS — J9610 Chronic respiratory failure, unspecified whether with hypoxia or hypercapnia: Secondary | ICD-10-CM | POA: Diagnosis not present

## 2024-11-23 DIAGNOSIS — R262 Difficulty in walking, not elsewhere classified: Secondary | ICD-10-CM | POA: Diagnosis not present

## 2024-11-23 DIAGNOSIS — I5032 Chronic diastolic (congestive) heart failure: Secondary | ICD-10-CM | POA: Diagnosis not present

## 2024-11-23 DIAGNOSIS — F330 Major depressive disorder, recurrent, mild: Secondary | ICD-10-CM | POA: Diagnosis not present

## 2024-12-21 DIAGNOSIS — I5032 Chronic diastolic (congestive) heart failure: Secondary | ICD-10-CM | POA: Diagnosis not present

## 2024-12-21 DIAGNOSIS — M4810 Ankylosing hyperostosis [Forestier], site unspecified: Secondary | ICD-10-CM | POA: Diagnosis not present

## 2024-12-21 DIAGNOSIS — J45909 Unspecified asthma, uncomplicated: Secondary | ICD-10-CM | POA: Diagnosis not present

## 2024-12-21 DIAGNOSIS — J449 Chronic obstructive pulmonary disease, unspecified: Secondary | ICD-10-CM | POA: Diagnosis not present
